# Patient Record
Sex: Female | Born: 1990 | Race: Black or African American | Hispanic: No | Marital: Single | Smoking: Former smoker
Health system: Southern US, Community
[De-identification: ages and names within clinical notes are randomized; demographics above are authoritative.]

## PROBLEM LIST (undated history)

## (undated) ENCOUNTER — Inpatient Hospital Stay (HOSPITAL_COMMUNITY): Payer: Self-pay

## (undated) DIAGNOSIS — A749 Chlamydial infection, unspecified: Secondary | ICD-10-CM

## (undated) DIAGNOSIS — A6920 Lyme disease, unspecified: Secondary | ICD-10-CM

## (undated) DIAGNOSIS — Z789 Other specified health status: Secondary | ICD-10-CM

## (undated) DIAGNOSIS — Z202 Contact with and (suspected) exposure to infections with a predominantly sexual mode of transmission: Secondary | ICD-10-CM

## (undated) HISTORY — PX: NO PAST SURGERIES: SHX2092

---
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
## 2012-05-14 ENCOUNTER — Encounter (HOSPITAL_COMMUNITY): Payer: Self-pay | Admitting: *Deleted

## 2012-05-14 ENCOUNTER — Inpatient Hospital Stay (HOSPITAL_COMMUNITY)
Admission: AD | Admit: 2012-05-14 | Discharge: 2012-05-14 | Disposition: A | Discharge status: Hospice | Payer: Medicaid Other | Source: Ambulatory Visit | Attending: Family Medicine | Admitting: Family Medicine

## 2012-05-14 DIAGNOSIS — O209 Hemorrhage in early pregnancy, unspecified: Secondary | ICD-10-CM | POA: Insufficient documentation

## 2012-05-14 HISTORY — DX: Contact with and (suspected) exposure to infections with a predominantly sexual mode of transmission: Z20.2

## 2012-05-14 LAB — URINALYSIS, ROUTINE W REFLEX MICROSCOPIC
Bilirubin Urine: NEGATIVE
Hgb urine dipstick: NEGATIVE
Ketones, ur: NEGATIVE mg/dL
Protein, ur: NEGATIVE mg/dL
Urobilinogen, UA: 1 mg/dL (ref 0.0–1.0)

## 2012-05-14 LAB — WET PREP, GENITAL
Clue Cells Wet Prep HPF POC: NONE SEEN
Trich, Wet Prep: NONE SEEN
Yeast Wet Prep HPF POC: NONE SEEN

## 2012-05-14 MED ORDER — PRENATAL VITAMINS (DIS) PO TABS: 1.0000 | ORAL_TABLET | Freq: Every day | ORAL | Status: DC

## 2012-05-14 NOTE — MAU Note (Signed)
Last period was June 14-18.  Has been nauseated since last week.

## 2012-05-14 NOTE — MAU Note (Signed)
PT SAYS   NO BIRTH CONTROL-  LAST SEX 7-12-    NO HOME PREG TEST.   CRAMPS STARTED ON  7-20- LAST X1 WEEK- NO CYCLE.  Marland Kitchen  NAUSEATED  X1 WEEK.

## 2012-05-14 NOTE — MAU Provider Note (Signed)
Chief Complaint:  Vaginal Bleeding and Abdominal Pain    First Provider Initiated Contact with Patient 05/14/12 2158      Lauren Mcclain is  21 y.o. G1P0.  Patient's last menstrual period was 03/30/2012.Marland Kitchen  Her pregnancy status is positive.  She presents complaining of Vaginal Bleeding and Abdominal Pain . Onset is described as ongoing and has been present for  1 week. Reports intermittent lower abd cramping and pink vaginal discharge.   Obstetrical/Gynecological History: OB History    Grav Para Term Preterm Abortions TAB SAB Ect Mult Living   2               Past Medical History: Past Medical History  Diagnosis Date  . Trichomonas contact, treated     Past Surgical History: Past Surgical History  Procedure Date  . No past surgeries     Family History: History reviewed. No pertinent family history.  Social History: History  Substance Use Topics  . Smoking status: Current Everyday Smoker -- 2 years    Types: Cigarettes  . Smokeless tobacco: Not on file  . Alcohol Use: Yes     4 weeks ago    Allergies: No Known Allergies  No prescriptions prior to admission    Review of Systems - History obtained from the patient Respiratory ROS: no cough, shortness of breath, or wheezing Cardiovascular ROS: no chest pain or dyspnea on exertion Gastrointestinal ROS: positive for - abdominal pain negative for - diarrhea, gas/bloating or nausea/vomiting Genito-Urinary ROS: no dysuria, trouble voiding, or hematuria positive for - genital discharge  Physical Exam   Blood pressure 106/65, pulse 71, temperature 98.7 F (37.1 C), temperature source Oral, resp. rate 16, height 5' 7.5" (1.715 m), weight 131 lb 6 oz (59.591 kg), last menstrual period 03/30/2012.  General: General appearance - alert, well appearing, and in no distress, oriented to person, place, and time and normal appearing weight Mental status - alert, oriented to person, place, and time, normal mood, behavior,  speech, dress, motor activity, and thought processes, affect appropriate to mood Abdomen - soft, nontender, nondistended, no masses or organomegaly Focused Gynecological Exam: VULVA: normal appearing vulva with no masses, tenderness or lesions, VAGINA: vaginal discharge - scant and creamy pink, normal vagina, CERVIX: normal appearing cervix without discharge or lesions, nulliparous os, UTERUS: enlarged to 5-6 week's size, ADNEXA: normal adnexa in size, nontender and no masses  Labs: Recent Results (from the past 24 hour(s))  URINALYSIS, ROUTINE W REFLEX MICROSCOPIC   Collection Time   05/14/12  8:16 PM      Component Value Range   Color, Urine YELLOW  YELLOW   APPearance CLEAR  CLEAR   Specific Gravity, Urine 1.015  1.005 - 1.030   pH 8.0  5.0 - 8.0   Glucose, UA NEGATIVE  NEGATIVE mg/dL   Hgb urine dipstick NEGATIVE  NEGATIVE   Bilirubin Urine NEGATIVE  NEGATIVE   Ketones, ur NEGATIVE  NEGATIVE mg/dL   Protein, ur NEGATIVE  NEGATIVE mg/dL   Urobilinogen, UA 1.0  0.0 - 1.0 mg/dL   Nitrite NEGATIVE  NEGATIVE   Leukocytes, UA NEGATIVE  NEGATIVE  WET PREP, GENITAL   Collection Time   05/14/12 10:18 PM      Component Value Range   Yeast Wet Prep HPF POC NONE SEEN  NONE SEEN   Trich, Wet Prep NONE SEEN  NONE SEEN   Clue Cells Wet Prep HPF POC NONE SEEN  NONE SEEN   WBC, Wet Prep HPF POC  FEW (*) NONE SEEN   Imaging Studies:  Informal bedside US shows IUP with + Cardiac activity. CRL [redacted]w[redacted]d, c/w LMP   Assessment: 1. Bleeding in early pregnancy     Plan: Discharge home Preg verification letter and OB referral list given FU with provider of choice to begin [facility].  Brick Ketcher E. 05/14/2012,11:02 PM

## 2012-05-15 LAB — GC/CHLAMYDIA PROBE AMP, GENITAL: Chlamydia, DNA Probe: NEGATIVE

## 2012-05-15 NOTE — MAU Provider Note (Signed)
Chart reviewed and agree with management and plan.  

## 2012-06-19 LAB — OB RESULTS CONSOLE RUBELLA ANTIBODY, IGM: Rubella: IMMUNE

## 2012-06-19 LAB — OB RESULTS CONSOLE HIV ANTIBODY (ROUTINE TESTING): HIV: NONREACTIVE

## 2012-10-17 NOTE — L&D Delivery Note (Signed)
During the second stage. The patient developed an enlarging hematoma in the right labia. This was obstructing the vaginal delivery. We set her up in the dorsal lithotomy position. We anesthetized the skin. We incised and drained a large hematoma from the right labia. Continue to hold pressure as she continued to push. She became exhausted we decide to proceed with a vacuum extracted assisted vaginal delivery. The risk of vacuum extractor assisted vaginal delivery were explained. This includes the risk of developing a hematoma of the scalp. This could require transfusions. There is a risk of intracranial bleed particularly subdural hematomas. The risk of shoulder dystocia with this resulting complications. He initially tried the [employer] vac. This did not maintain suction. We switched over to the [facility]. With this the vertex was easily delivered. The infant was a viable female with Apgars of 7/9. There was a second-degree laceration. We could see the area that was bleeding from the perineum that probably caused the hematoma. We first identified the distal section of the vaginal laceration. We brought this together with a running interlocking suture of 0 chromic. The deep stitches into the perineum with 2-0 chromic reapproximating this. The skin over the peritoneum was then closed with a running subcuticular of 2-0 chromic. We then put one deep figure-of-eight in the perineal body of 2-0 chromic. No further bleeding or development of a hematoma was noted. The area that we had incised and drained the hematoma was closed with interrupted sutures of 2-0 chromic. A Foley was placed to straight drain. Of note the placenta had been delivered intact. He did have a three-vessel cord. Total blood loss was approximately 650 cc. Mother and baby were doing well in the postpartum period.

## 2012-12-06 LAB — OB RESULTS CONSOLE GBS: GBS: NEGATIVE

## 2012-12-16 ENCOUNTER — Encounter (HOSPITAL_COMMUNITY): Payer: Self-pay | Admitting: Obstetrics and Gynecology

## 2012-12-16 ENCOUNTER — Inpatient Hospital Stay (HOSPITAL_COMMUNITY)
Admission: AD | Admit: 2012-12-16 | Discharge: 2012-12-16 | Disposition: A | Discharge status: Home / Self Care | Payer: Medicaid Other | Source: Ambulatory Visit | Attending: Obstetrics and Gynecology | Admitting: Obstetrics and Gynecology

## 2012-12-16 DIAGNOSIS — K5289 Other specified noninfective gastroenteritis and colitis: Secondary | ICD-10-CM | POA: Insufficient documentation

## 2012-12-16 DIAGNOSIS — R197 Diarrhea, unspecified: Secondary | ICD-10-CM | POA: Insufficient documentation

## 2012-12-16 DIAGNOSIS — O99891 Other specified diseases and conditions complicating pregnancy: Secondary | ICD-10-CM | POA: Insufficient documentation

## 2012-12-16 DIAGNOSIS — R109 Unspecified abdominal pain: Secondary | ICD-10-CM | POA: Insufficient documentation

## 2012-12-16 DIAGNOSIS — O212 Late vomiting of pregnancy: Secondary | ICD-10-CM | POA: Insufficient documentation

## 2012-12-16 DIAGNOSIS — K529 Noninfective gastroenteritis and colitis, unspecified: Secondary | ICD-10-CM

## 2012-12-16 HISTORY — DX: Other specified health status: Z78.9

## 2012-12-16 LAB — URINALYSIS, ROUTINE W REFLEX MICROSCOPIC
Glucose, UA: NEGATIVE mg/dL
Nitrite: NEGATIVE
Protein, ur: NEGATIVE mg/dL

## 2012-12-16 LAB — URINE MICROSCOPIC-ADD ON

## 2012-12-16 MED ORDER — LOPERAMIDE HCL 2 MG PO CAPS: 2.0000 mg | ORAL_CAPSULE | Freq: Four times a day (QID) | ORAL | Status: DC | PRN

## 2012-12-16 MED ORDER — ONDANSETRON 8 MG PO TBDP
8.0000 mg | ORAL_TABLET | Freq: Once | ORAL | Status: AC
  Administered 2012-12-16: 8 mg via ORAL
  Filled 2012-12-16 (×2): qty 1

## 2012-12-16 MED ORDER — ONDANSETRON 8 MG PO TBDP: 8.0000 mg | ORAL_TABLET | Freq: Three times a day (TID) | ORAL | Status: DC | PRN

## 2012-12-16 NOTE — MAU Note (Signed)
"  I've had diarrhea all day starting this morning.  My stomach is cramping from UC's and then after the UC goes away, my abd will still hurt for about 2 mins.  (+) FM, No VB or LOF."

## 2012-12-16 NOTE — MAU Note (Signed)
Lauren Mcclain is here today with complaints of Nausea and diarrhea. She has had 7 episodes of diarrhea. The symptoms started this morning when she woke up. She is [redacted]w[redacted]d

## 2012-12-16 NOTE — MAU Provider Note (Signed)
History     CSN: [id number]  Arrival date and time: 12/16/12 1557   None     Chief Complaint  Patient presents with  . Nausea  . Diarrhea   HPI 22 y.o. G1P0 at [redacted]w[redacted]d with nausea and diarrhea today, no vomiting, but unable to eat d/t nausea. No fever or chills. + low abd pain.    Past Medical History  Diagnosis Date  . Trichomonas contact, treated   . Medical history non-contributory     Past Surgical History  Procedure Laterality Date  . No past surgeries      Family History  Problem Relation Age of Onset  . Family history unknown: Yes    History  Substance Use Topics  . Smoking status: Current Every Day Smoker -- 2 years    Types: Cigarettes  . Smokeless tobacco: Not on file  . Alcohol Use: No     Comment: Not currently    Allergies: No Known Allergies  Prescriptions prior to admission  Medication Sig Dispense Refill  . Prenatal Vitamins (DIS) TABS Take 1 tablet by mouth daily.  90 tablet  5    ROS Physical Exam   Blood pressure 139/66, pulse 106, temperature 98.1 F (36.7 C), temperature source Oral, resp. rate 18, height 5\' 9"  (1.753 m), last menstrual period 03/30/2012.  Physical Exam  Nursing note and vitals reviewed. Constitutional: She is oriented to person, place, and time. She appears well-developed and well-nourished. No distress.  Cardiovascular: Normal rate.   Respiratory: Effort normal.  GI: Soft. She exhibits no mass. There is no tenderness. There is no rebound and no guarding.  Genitourinary:  Dilation: 1 Effacement (%): 50 Station: -1 Presentation: Vertex Exam by:: Georges Mouse, CNM   Musculoskeletal: Normal range of motion.  Neurological: She is alert and oriented to person, place, and time.  Skin: Skin is warm and dry.  Psychiatric: She has a normal mood and affect.    MAU Course  Procedures Results for orders placed during the hospital encounter of 12/16/12 (from the past 24 hour(s))  URINALYSIS, ROUTINE W REFLEX  MICROSCOPIC     Status: Abnormal   Collection Time    12/16/12  4:10 PM      Result Value Range   Color, Urine YELLOW  YELLOW   APPearance CLEAR  CLEAR   Specific Gravity, Urine >1.030 (*) 1.005 - 1.030   pH 6.0  5.0 - 8.0   Glucose, UA NEGATIVE  NEGATIVE mg/dL   Hgb urine dipstick NEGATIVE  NEGATIVE   Bilirubin Urine NEGATIVE  NEGATIVE   Ketones, ur NEGATIVE  NEGATIVE mg/dL   Protein, ur NEGATIVE  NEGATIVE mg/dL   Urobilinogen, UA 0.2  0.0 - 1.0 mg/dL   Nitrite NEGATIVE  NEGATIVE   Leukocytes, UA TRACE (*) NEGATIVE  URINE MICROSCOPIC-ADD ON     Status: Abnormal   Collection Time    12/16/12  4:10 PM      Result Value Range   Squamous Epithelial / LPF MANY (*) RARE   WBC, UA 11-20  <3 WBC/hpf   Bacteria, UA FEW (*) RARE   Urine-Other MUCOUS PRESENT     Zofran ODT 8 mg given in MAU  Assessment and Plan  22 y.o. G1P0 at [redacted]w[redacted]d with  1. Acute gastroenteritis       Medication List    TAKE these medications       acetaminophen 325 MG tablet  Commonly known as:  TYLENOL  Take 650 mg by mouth every 6 (  six) hours as needed for pain.     loperamide 2 MG capsule  Commonly known as:  IMODIUM  Take 1 capsule (2 mg total) by mouth 4 (four) times daily as needed for diarrhea or loose stools.     ondansetron 8 MG disintegrating tablet  Commonly known as:  ZOFRAN ODT  Take 1 tablet (8 mg total) by mouth every 8 (eight) hours as needed for nausea.     prenatal multivitamin Tabs  Take 1 tablet by mouth daily at 12 noon.            Follow-up Information   Follow up with Meriel Pica, MD. (as scheduled or sooner as needed)    Contact information:   [address] [place] [address] [phone]         [facility] 12/16/2012, 4:40 PM

## 2012-12-18 LAB — URINE CULTURE

## 2013-01-03 ENCOUNTER — Inpatient Hospital Stay (HOSPITAL_COMMUNITY)
Admission: AD | Admit: 2013-01-03 | Discharge: 2013-01-06 | DRG: 775 | Disposition: A | Discharge status: Home / Self Care | Payer: Medicaid Other | Source: Ambulatory Visit | Attending: Obstetrics and Gynecology | Admitting: Obstetrics and Gynecology

## 2013-01-03 ENCOUNTER — Telehealth (HOSPITAL_COMMUNITY): Payer: Self-pay | Admitting: *Deleted

## 2013-01-03 ENCOUNTER — Encounter (HOSPITAL_COMMUNITY): Payer: Self-pay | Admitting: Anesthesiology

## 2013-01-03 ENCOUNTER — Inpatient Hospital Stay (HOSPITAL_COMMUNITY)
Admission: AD | Admit: 2013-01-03 | Discharge: 2013-01-03 | Disposition: A | Discharge status: Home / Self Care | Payer: Medicaid Other | Source: Ambulatory Visit | Attending: Obstetrics and Gynecology | Admitting: Obstetrics and Gynecology

## 2013-01-03 ENCOUNTER — Encounter (HOSPITAL_COMMUNITY): Payer: Self-pay | Admitting: *Deleted

## 2013-01-03 ENCOUNTER — Inpatient Hospital Stay (HOSPITAL_COMMUNITY): Payer: Medicaid Other | Admitting: Anesthesiology

## 2013-01-03 DIAGNOSIS — O26899 Other specified pregnancy related conditions, unspecified trimester: Principal | ICD-10-CM | POA: Diagnosis not present

## 2013-01-03 DIAGNOSIS — O479 False labor, unspecified: Secondary | ICD-10-CM | POA: Insufficient documentation

## 2013-01-03 LAB — CBC
Hemoglobin: 10.9 g/dL — ABNORMAL LOW (ref 12.0–15.0)
MCH: 28.7 pg (ref 26.0–34.0)
MCHC: 33 g/dL (ref 30.0–36.0)
MCV: 86.8 fL (ref 78.0–100.0)
RBC: 3.8 MIL/uL — ABNORMAL LOW (ref 3.87–5.11)

## 2013-01-03 MED ORDER — LACTATED RINGERS IV SOLN
INTRAVENOUS | Status: DC
  Administered 2013-01-03 (×2): via INTRAVENOUS

## 2013-01-03 MED ORDER — OXYCODONE-ACETAMINOPHEN 5-325 MG PO TABS: 1.0000 | ORAL_TABLET | ORAL | Status: DC | PRN

## 2013-01-03 MED ORDER — FLEET ENEMA 7-19 GM/118ML RE ENEM: 1.0000 | ENEMA | RECTAL | Status: DC | PRN

## 2013-01-03 MED ORDER — FENTANYL 2.5 MCG/ML BUPIVACAINE 1/10 % EPIDURAL INFUSION (WH - ANES)
INTRAMUSCULAR | Status: AC
  Administered 2013-01-03: 14 mL/h via EPIDURAL
  Filled 2013-01-03: qty 125

## 2013-01-03 MED ORDER — PHENYLEPHRINE 40 MCG/ML (10ML) SYRINGE FOR IV PUSH (FOR BLOOD PRESSURE SUPPORT): 80.0000 ug | PREFILLED_SYRINGE | INTRAVENOUS | Status: DC | PRN

## 2013-01-03 MED ORDER — BUTORPHANOL TARTRATE 1 MG/ML IJ SOLN
1.0000 mg | Freq: Once | INTRAMUSCULAR | Status: AC
  Administered 2013-01-03: 1 mg via INTRAVENOUS
  Filled 2013-01-03: qty 1

## 2013-01-03 MED ORDER — ACETAMINOPHEN 325 MG PO TABS
650.0000 mg | ORAL_TABLET | ORAL | Status: DC | PRN
  Administered 2013-01-04: 650 mg via ORAL
  Filled 2013-01-03: qty 2

## 2013-01-03 MED ORDER — OXYCODONE-ACETAMINOPHEN 5-325 MG PO TABS
1.0000 | ORAL_TABLET | Freq: Once | ORAL | Status: AC
  Administered 2013-01-03: 1 via ORAL
  Filled 2013-01-03: qty 1

## 2013-01-03 MED ORDER — ONDANSETRON HCL 4 MG/2ML IJ SOLN: 4.0000 mg | Freq: Four times a day (QID) | INTRAMUSCULAR | Status: DC | PRN

## 2013-01-03 MED ORDER — OXYTOCIN 40 UNITS IN LACTATED RINGERS INFUSION - SIMPLE MED
62.5000 mL/h | INTRAVENOUS | Status: DC
  Administered 2013-01-04: 999 mL/h via INTRAVENOUS
  Filled 2013-01-03: qty 1000

## 2013-01-03 MED ORDER — LACTATED RINGERS IV SOLN: INTRAVENOUS | Status: DC

## 2013-01-03 MED ORDER — ACETAMINOPHEN 325 MG PO TABS: 650.0000 mg | ORAL_TABLET | ORAL | Status: DC | PRN

## 2013-01-03 MED ORDER — OXYTOCIN BOLUS FROM INFUSION: 500.0000 mL | INTRAVENOUS | Status: DC

## 2013-01-03 MED ORDER — SODIUM BICARBONATE 8.4 % IV SOLN
INTRAVENOUS | Status: DC | PRN
  Administered 2013-01-03: 5 mL via EPIDURAL

## 2013-01-03 MED ORDER — LACTATED RINGERS IV SOLN
500.0000 mL | Freq: Once | INTRAVENOUS | Status: AC
  Administered 2013-01-03: 18:00:00 via INTRAVENOUS

## 2013-01-03 MED ORDER — DIPHENHYDRAMINE HCL 50 MG/ML IJ SOLN
12.5000 mg | INTRAMUSCULAR | Status: DC | PRN
  Administered 2013-01-03 (×2): 12.5 mg via INTRAVENOUS
  Filled 2013-01-03 (×2): qty 1

## 2013-01-03 MED ORDER — EPHEDRINE 5 MG/ML INJ: 10.0000 mg | INTRAVENOUS | Status: DC | PRN

## 2013-01-03 MED ORDER — PHENYLEPHRINE 40 MCG/ML (10ML) SYRINGE FOR IV PUSH (FOR BLOOD PRESSURE SUPPORT)
80.0000 ug | PREFILLED_SYRINGE | INTRAVENOUS | Status: DC | PRN
  Filled 2013-01-03: qty 5

## 2013-01-03 MED ORDER — FENTANYL 2.5 MCG/ML BUPIVACAINE 1/10 % EPIDURAL INFUSION (WH - ANES)
14.0000 mL/h | INTRAMUSCULAR | Status: DC | PRN
  Administered 2013-01-03: 14 mL/h via EPIDURAL
  Filled 2013-01-03 (×2): qty 125

## 2013-01-03 MED ORDER — LACTATED RINGERS IV SOLN: 500.0000 mL | INTRAVENOUS | Status: DC | PRN

## 2013-01-03 MED ORDER — LIDOCAINE HCL (PF) 1 % IJ SOLN: 30.0000 mL | INTRAMUSCULAR | Status: DC | PRN

## 2013-01-03 MED ORDER — EPHEDRINE 5 MG/ML INJ
10.0000 mg | INTRAVENOUS | Status: DC | PRN
  Filled 2013-01-03: qty 4

## 2013-01-03 MED ORDER — IBUPROFEN 600 MG PO TABS: 600.0000 mg | ORAL_TABLET | Freq: Four times a day (QID) | ORAL | Status: DC | PRN

## 2013-01-03 MED ORDER — CITRIC ACID-SODIUM CITRATE 334-500 MG/5ML PO SOLN: 30.0000 mL | ORAL | Status: DC | PRN

## 2013-01-03 MED ORDER — IBUPROFEN 600 MG PO TABS
600.0000 mg | ORAL_TABLET | Freq: Four times a day (QID) | ORAL | Status: DC | PRN
  Administered 2013-01-04: 600 mg via ORAL
  Filled 2013-01-03: qty 1

## 2013-01-03 MED ORDER — LACTATED RINGERS IV SOLN: 500.0000 mL | Freq: Once | INTRAVENOUS | Status: DC

## 2013-01-03 NOTE — Anesthesia Preprocedure Evaluation (Signed)

## 2013-01-03 NOTE — MAU Note (Signed)
Dr. Renaldo Fiddler notified of pt, orders rec'd.

## 2013-01-03 NOTE — MAU Note (Signed)
contractions 

## 2013-01-03 NOTE — MAU Note (Signed)
Dr. Renaldo Fiddler notified of pt SVE, UC, pain and FHR tracing.  Monitor pt x 1 hr and recheck cervix.

## 2013-01-03 NOTE — MAU Note (Signed)
Pt up to bathroom.

## 2013-01-03 NOTE — Anesthesia Procedure Notes (Signed)

## 2013-01-03 NOTE — Progress Notes (Signed)
Pt denies srom

## 2013-01-03 NOTE — H&P (Signed)
Lauren Mcclain is a 22 y.o. female presenting at 40 weeks with SOL and SROM.  Negative GBS.  Uncomplicated PNC Maternal Medical History:  Reason for admission: Rupture of membranes and contractions.   Contractions: Onset was 3-5 hours ago.   Frequency: regular.   Perceived severity is strong.    Fetal activity: Perceived fetal activity is normal.    Prenatal complications: no prenatal complications Prenatal Complications - Diabetes: none.    OB History   Grav Para Term Preterm Abortions TAB SAB Ect Mult Living   1              Past Medical History  Diagnosis Date  . Trichomonas contact, treated   . Medical history non-contributory    Past Surgical History  Procedure Laterality Date  . No past surgeries     Family History: family history includes Drug abuse in her father and mother. Social History:  reports that she has quit smoking. Her smoking use included Cigarettes. She smoked 0.00 packs per day for 2 years. She does not have any smokeless tobacco history on file. She reports that she does not drink alcohol or use illicit drugs.   Prenatal Transfer Tool  Maternal Diabetes: No Genetic Screening: Normal Maternal Ultrasounds/Referrals: Normal Fetal Ultrasounds or other Referrals:  None Maternal Substance Abuse:  Yes:  Type: Marijuana Significant Maternal Medications:  None Significant Maternal Lab Results:  None Other Comments:  None  ROS    Last menstrual period 03/30/2012. Maternal Exam:  Uterine Assessment: Contraction strength is firm.  Contraction duration is 4 minutes. Contraction frequency is regular.   Abdomen: Patient reports no abdominal tenderness. Fundal height is c/w dates.   Fetal presentation: vertex  Introitus: Normal vulva. Nitrazine test: positive. Amniotic fluid character: clear.  Pelvis: adequate for delivery.   Cervix: 4 cm 80 %  vtx at -1 gross ROM  Physical Exam  Prenatal labs: ABO, Rh: B/Positive/-- (09/03 0000) Antibody: Negative  (09/03 0000) Rubella: Immune (09/03 0000) RPR: Nonreactive (09/03 0000)  HBsAg: Negative (09/03 0000)  HIV: Non-reactive (09/03 0000)  GBS: Negative (02/20 0000)   Assessment/Plan: IUP at term with spontaneous onset of labor Routine labor and delivery   Lauren Mcclain 01/03/2013, 3:40 PM

## 2013-01-03 NOTE — Progress Notes (Signed)
Pt now reports srom before md appointment this afternoon.  Dr Arelia Sneddon reports srom to rn

## 2013-01-03 NOTE — Telephone Encounter (Signed)
Preadmission screen  

## 2013-01-03 NOTE — Progress Notes (Signed)
efm removed per anesthesia    

## 2013-01-04 ENCOUNTER — Encounter (HOSPITAL_COMMUNITY): Payer: Self-pay | Admitting: *Deleted

## 2013-01-04 MED ORDER — ONDANSETRON HCL 4 MG/2ML IJ SOLN: 4.0000 mg | INTRAMUSCULAR | Status: DC | PRN

## 2013-01-04 MED ORDER — IBUPROFEN 600 MG PO TABS
600.0000 mg | ORAL_TABLET | Freq: Four times a day (QID) | ORAL | Status: DC
  Administered 2013-01-04 – 2013-01-06 (×9): 600 mg via ORAL
  Filled 2013-01-04 (×9): qty 1

## 2013-01-04 MED ORDER — LIDOCAINE HCL (PF) 1 % IJ SOLN
INTRAMUSCULAR | Status: AC
  Administered 2013-01-04: 30 mL
  Filled 2013-01-04: qty 30

## 2013-01-04 MED ORDER — FLEET ENEMA 7-19 GM/118ML RE ENEM: 1.0000 | ENEMA | Freq: Every day | RECTAL | Status: DC | PRN

## 2013-01-04 MED ORDER — PRENATAL MULTIVITAMIN CH
1.0000 | ORAL_TABLET | Freq: Every day | ORAL | Status: DC
  Administered 2013-01-05: 1 via ORAL
  Filled 2013-01-04: qty 1

## 2013-01-04 MED ORDER — BISACODYL 10 MG RE SUPP
10.0000 mg | Freq: Every day | RECTAL | Status: DC | PRN
  Filled 2013-01-04: qty 1

## 2013-01-04 MED ORDER — SENNOSIDES-DOCUSATE SODIUM 8.6-50 MG PO TABS
2.0000 | ORAL_TABLET | Freq: Every day | ORAL | Status: DC
  Administered 2013-01-04 – 2013-01-05 (×2): 2 via ORAL

## 2013-01-04 MED ORDER — BENZOCAINE-MENTHOL 20-0.5 % EX AERO
1.0000 "application " | INHALATION_SPRAY | CUTANEOUS | Status: DC | PRN
  Administered 2013-01-04: 1 via TOPICAL
  Filled 2013-01-04: qty 56

## 2013-01-04 MED ORDER — SIMETHICONE 80 MG PO CHEW
80.0000 mg | CHEWABLE_TABLET | ORAL | Status: DC | PRN
  Administered 2013-01-05: 80 mg via ORAL

## 2013-01-04 MED ORDER — LANOLIN HYDROUS EX OINT: TOPICAL_OINTMENT | CUTANEOUS | Status: DC | PRN

## 2013-01-04 MED ORDER — TETANUS-DIPHTH-ACELL PERTUSSIS 5-2.5-18.5 LF-MCG/0.5 IM SUSP
0.5000 mL | Freq: Once | INTRAMUSCULAR | Status: AC
  Administered 2013-01-05: 0.5 mL via INTRAMUSCULAR
  Filled 2013-01-04: qty 0.5

## 2013-01-04 MED ORDER — ONDANSETRON HCL 4 MG PO TABS: 4.0000 mg | ORAL_TABLET | ORAL | Status: DC | PRN

## 2013-01-04 MED ORDER — DIBUCAINE 1 % RE OINT: 1.0000 "application " | TOPICAL_OINTMENT | RECTAL | Status: DC | PRN

## 2013-01-04 MED ORDER — WITCH HAZEL-GLYCERIN EX PADS: 1.0000 "application " | MEDICATED_PAD | CUTANEOUS | Status: DC | PRN

## 2013-01-04 MED ORDER — DIPHENHYDRAMINE HCL 25 MG PO CAPS
25.0000 mg | ORAL_CAPSULE | Freq: Four times a day (QID) | ORAL | Status: DC | PRN
  Administered 2013-01-05: 25 mg via ORAL
  Filled 2013-01-04: qty 1

## 2013-01-04 MED ORDER — OXYCODONE-ACETAMINOPHEN 5-325 MG PO TABS
1.0000 | ORAL_TABLET | ORAL | Status: DC | PRN
  Administered 2013-01-04 – 2013-01-06 (×7): 1 via ORAL
  Filled 2013-01-04 (×7): qty 1

## 2013-01-04 MED ORDER — ZOLPIDEM TARTRATE 5 MG PO TABS: 5.0000 mg | ORAL_TABLET | Freq: Every evening | ORAL | Status: DC | PRN

## 2013-01-04 NOTE — Significant Event (Signed)
Initial efm  tracing from 1530-1641 on 01/03/2013 stored in error to [facility] #[id number]

## 2013-01-04 NOTE — Progress Notes (Signed)
Dr Arelia Sneddon discussing with pt risks and benefits of vacuum assisted delivery, pt verbalizes and understands, to proceed with vacuum delivery, see dr note.

## 2013-01-04 NOTE — Anesthesia Postprocedure Evaluation (Signed)
Anesthesia Post Note  Patient: Lauren Mcclain  Procedure(s) Performed: * No procedures listed *  Anesthesia type: Epidural  Patient location: Mother/Baby  Post pain: Pain level controlled  Post assessment: Post-op Vital signs reviewed  Last Vitals:  Filed Vitals:   01/04/13 0912  BP: 104/60  Pulse: 80  Temp: 37.1 C  Resp: 20    Post vital signs: Reviewed  Level of consciousness:alert  Complications: No apparent anesthesia complications

## 2013-01-05 LAB — CBC
HCT: 23.9 % — ABNORMAL LOW (ref 36.0–46.0)
Hemoglobin: 8 g/dL — ABNORMAL LOW (ref 12.0–15.0)
MCV: 86.3 fL (ref 78.0–100.0)
RDW: 14 % (ref 11.5–15.5)
WBC: 13.3 10*3/uL — ABNORMAL HIGH (ref 4.0–10.5)

## 2013-01-05 MED ORDER — FERROUS FUMARATE 325 (106 FE) MG PO TABS
1.0000 | ORAL_TABLET | Freq: Two times a day (BID) | ORAL | Status: DC
  Administered 2013-01-05: 106 mg via ORAL
  Filled 2013-01-05 (×3): qty 1

## 2013-01-05 MED ORDER — FAMOTIDINE 20 MG PO TABS
20.0000 mg | ORAL_TABLET | Freq: Two times a day (BID) | ORAL | Status: DC
  Administered 2013-01-05: 20 mg via ORAL
  Filled 2013-01-05: qty 1

## 2013-01-05 MED ORDER — ALUM & MAG HYDROXIDE-SIMETH 200-200-20 MG/5ML PO SUSP
30.0000 mL | ORAL | Status: DC | PRN
  Administered 2013-01-05: 30 mL via ORAL
  Filled 2013-01-05: qty 30

## 2013-01-05 MED ORDER — DOCUSATE SODIUM 100 MG PO CAPS
100.0000 mg | ORAL_CAPSULE | Freq: Two times a day (BID) | ORAL | Status: DC
  Administered 2013-01-05: 100 mg via ORAL
  Filled 2013-01-05: qty 1

## 2013-01-05 NOTE — Progress Notes (Signed)
PPD #1 Ambulating well Good pain relief Foley still in  Blood pressure 113/70, pulse 80, temperature 97.6 F (36.4 C), temperature source Oral, resp. rate 20, height 5\' 9"  (1.753 m), weight 180 lb (81.647 kg), last menstrual period 03/30/2012, SpO2 100.00%, unknown if currently breastfeeding.  Results for orders placed during the hospital encounter of 01/03/13 (from the past 24 hour(s))  CBC     Status: Abnormal   Collection Time    01/05/13  6:46 AM      Result Value Range   WBC 13.3 (*) 4.0 - 10.5 K/uL   RBC 2.77 (*) 3.87 - 5.11 MIL/uL   Hemoglobin 8.0 (*) 12.0 - 15.0 g/dL   HCT 16.1 (*) 09.6 - 04.5 %   MCV 86.3  78.0 - 100.0 fL   MCH 28.9  26.0 - 34.0 pg   MCHC 33.5  30.0 - 36.0 g/dL   RDW 40.9  81.1 - 91.4 %   Platelets 127 (*) 150 - 400 K/uL   FFNT Labia swollen R>>L, examined with nurse who saw patient yesterday-she states swelling is greatly improved LE NT without cords  A: Labial Hematoma in second stage of labor-S/P I&D during labor-improving  P: D/C foley      FESO4

## 2013-01-06 MED ORDER — BENZOCAINE-MENTHOL 20-0.5 % EX AERO: 1.0000 "application " | INHALATION_SPRAY | CUTANEOUS | Status: DC | PRN

## 2013-01-06 MED ORDER — PRENATAL MULTIVITAMIN CH: 1.0000 | ORAL_TABLET | Freq: Every day | ORAL | Status: DC

## 2013-01-06 MED ORDER — PNEUMOCOCCAL VAC POLYVALENT 25 MCG/0.5ML IJ INJ: 0.5000 mL | INJECTION | INTRAMUSCULAR | Status: DC

## 2013-01-06 MED ORDER — FERROUS FUMARATE 325 (106 FE) MG PO TABS: 1.0000 | ORAL_TABLET | Freq: Two times a day (BID) | ORAL | Status: DC

## 2013-01-06 MED ORDER — OXYCODONE-ACETAMINOPHEN 5-325 MG PO TABS: 1.0000 | ORAL_TABLET | Freq: Four times a day (QID) | ORAL | Status: DC | PRN

## 2013-01-06 NOTE — Discharge Summary (Signed)
Obstetric Discharge Summary Reason for Admission: onset of labor Prenatal Procedures: ultrasound Intrapartum Procedures: spontaneous vaginal delivery and I&D of labial hematoma during second stage Postpartum Procedures: none Complications-Operative and Postpartum: labial hematoma improving, voiding well Hemoglobin  Date Value Range Status  01/05/2013 8.0* 12.0 - 15.0 g/dL Final     DELTA CHECK NOTED     REPEATED TO VERIFY     HCT  Date Value Range Status  01/05/2013 23.9* 36.0 - 46.0 % Final    Physical Exam:  General: alert, cooperative and no distress Lochia: appropriate Uterine Fundus: firm Incision: healing well DVT Evaluation: No evidence of DVT seen on physical exam.  Discharge Diagnoses: Term Pregnancy-delivered  Discharge Information: Date: 01/06/2013 Activity: pelvic rest Diet: routine Medications: PNV, Ibuprofen and Percocet Condition: stable Instructions: refer to practice specific booklet Discharge to: home Follow-up Information   Call in 4 weeks to follow up.      Newborn Data: Live born female  Birth Weight: 8 lb 1.1 oz (3660 g) APGAR: 7, 9  Home with mother.  Lauren Mcclain,Lauren Mcclain 01/06/2013, 7:32 AM

## 2013-01-06 NOTE — Progress Notes (Signed)
Post Partum Day 2 Subjective: no complaints, voiding, tolerating PO and + flatus Ambulating well in hallways  Objective: Blood pressure 121/79, pulse 73, temperature 98 F (36.7 C), temperature source Oral, resp. rate 20, height 5\' 9"  (1.753 m), weight 180 lb (81.647 kg), last menstrual period 03/30/2012, SpO2 100.00%, unknown if currently breastfeeding.  Physical Exam:  General: alert, cooperative and no distress Lochia: appropriate Uterine Fundus: firm Incision: healiing well, swelling continues to improve DVT Evaluation: No evidence of DVT seen on physical exam.   Recent Labs  01/03/13 1540 01/05/13 0646  HGB 10.9* 8.0*  HCT 33.0* 23.9*    Assessment/Plan: Discharge home   LOS: 3 days   Marrion Finan II,Karissa Meenan E 01/06/2013, 7:27 AM

## 2013-01-08 ENCOUNTER — Inpatient Hospital Stay (HOSPITAL_COMMUNITY): Admission: RE | Admit: 2013-01-08 | Payer: Medicaid Other | Source: Ambulatory Visit

## 2013-03-02 ENCOUNTER — Emergency Department (HOSPITAL_COMMUNITY)
Admission: EM | Admit: 2013-03-02 | Discharge: 2013-03-02 | Disposition: A | Discharge status: Home / Self Care | Payer: Medicaid Other | Attending: Emergency Medicine | Admitting: Emergency Medicine

## 2013-03-02 ENCOUNTER — Encounter (HOSPITAL_COMMUNITY): Payer: Self-pay | Admitting: *Deleted

## 2013-03-02 DIAGNOSIS — T498X5A Adverse effect of other topical agents, initial encounter: Secondary | ICD-10-CM | POA: Insufficient documentation

## 2013-03-02 DIAGNOSIS — F17200 Nicotine dependence, unspecified, uncomplicated: Secondary | ICD-10-CM | POA: Insufficient documentation

## 2013-03-02 DIAGNOSIS — T783XXA Angioneurotic edema, initial encounter: Secondary | ICD-10-CM | POA: Insufficient documentation

## 2013-03-02 LAB — BASIC METABOLIC PANEL
BUN: 10 mg/dL (ref 6–23)
Calcium: 9.1 mg/dL (ref 8.4–10.5)
Creatinine, Ser: 0.82 mg/dL (ref 0.50–1.10)
GFR calc Af Amer: >90 mL/min (ref >90)
GFR calc non Af Amer: >90 mL/min (ref >90)
Glucose, Bld: 98 mg/dL (ref 70–99)

## 2013-03-02 LAB — CBC WITH DIFFERENTIAL/PLATELET
Basophils Relative: 0 % (ref 0–1)
Eosinophils Absolute: 0.1 10*3/uL (ref 0.0–0.7)
Eosinophils Relative: 1 % (ref 0–5)
HCT: 37 % (ref 36.0–46.0)
Hemoglobin: 12.2 g/dL (ref 12.0–15.0)
Lymphs Abs: 1.9 10*3/uL (ref 0.7–4.0)
MCH: 27.8 pg (ref 26.0–34.0)
MCHC: 33 g/dL (ref 30.0–36.0)
MCV: 84.3 fL (ref 78.0–100.0)
Monocytes Absolute: 0.4 10*3/uL (ref 0.1–1.0)
Monocytes Relative: 6 % (ref 3–12)
Neutrophils Relative %: 67 % (ref 43–77)

## 2013-03-02 MED ORDER — DIPHENHYDRAMINE HCL 50 MG/ML IJ SOLN
50.0000 mg | Freq: Once | INTRAMUSCULAR | Status: AC
Start: 2013-03-02 — End: 2013-03-02
  Administered 2013-03-02: 50 mg via INTRAVENOUS
  Filled 2013-03-02: qty 1

## 2013-03-02 MED ORDER — DEXAMETHASONE SODIUM PHOSPHATE 4 MG/ML IJ SOLN
4.0000 mg | Freq: Once | INTRAMUSCULAR | Status: AC
  Administered 2013-03-02: 4 mg via INTRAVENOUS
  Filled 2013-03-02: qty 1

## 2013-03-02 MED ORDER — FAMOTIDINE IN NACL 20-0.9 MG/50ML-% IV SOLN
20.0000 mg | Freq: Once | INTRAVENOUS | Status: AC
  Administered 2013-03-02: 20 mg via INTRAVENOUS
  Filled 2013-03-02: qty 50

## 2013-03-02 MED ORDER — SODIUM CHLORIDE 0.9 % IV BOLUS (SEPSIS)
1000.0000 mL | Freq: Once | INTRAVENOUS | Status: AC
  Administered 2013-03-02: 1000 mL via INTRAVENOUS

## 2013-03-02 NOTE — ED Notes (Signed)
Pt c/o swelling to lips that started last night. Edema noted to lips.

## 2013-03-02 NOTE — ED Provider Notes (Signed)
History     CSN: [id number]  Arrival date & time 03/02/13  4098   First MD Initiated Contact with Patient 03/02/13 1000      Chief Complaint  Patient presents with  . Facial Swelling    (Consider location/radiation/quality/duration/timing/severity/associated sxs/prior treatment) The history is provided by the patient.  Lauren Mcclain is a 22 y.o. female homeless healthy here presenting with lip swelling. Noticed left upper lip swollen yesterday. This morning at her entire lips were swollen. Denies any trouble breathing or stridor. She is not currently on any medications. No family history of angioedema. Denies being pregnant.   Past Medical History  Diagnosis Date  . Trichomonas contact, treated   . Medical history non-contributory     Past Surgical History  Procedure Laterality Date  . No past surgeries      Family History  Problem Relation Age of Onset  . Drug abuse Mother   . Drug abuse Father     History  Substance Use Topics  . Smoking status: Current Every Day Smoker -- 0.25 packs/day for 5 years    Types: Cigarettes    Last Attempt to Quit: 05/05/2012  . Smokeless tobacco: Not on file  . Alcohol Use: No     Comment: Not currently    OB History   Grav Para Term Preterm Abortions TAB SAB Ect Mult Living   1 1 1       1       Review of Systems  HENT:       Lip swelling   All other systems reviewed and are negative.    Allergies  Review of patient's allergies indicates no known allergies.  Home Medications   No current outpatient prescriptions on file.  BP 124/74  Temp(Src) 97.9 F (36.6 C) (Oral)  Resp 16  SpO2 100%  Physical Exam  Nursing note and vitals reviewed. Constitutional: She is oriented to person, place, and time. She appears well-developed and well-nourished.  HENT:  Head: Normocephalic.  Mouth/Throat: Oropharynx is clear and moist.  Both upper and lower lips are swollen. No evidence of insect bites or cellulitis. Lips are  tender bilaterally. No swelling in posterior OP and uvula is not swollen.   Eyes: Conjunctivae are normal. Pupils are equal, round, and reactive to light.  Neck: Normal range of motion. Neck supple.  No stridor   Cardiovascular: Normal rate, regular rhythm and normal heart sounds.   Pulmonary/Chest: Effort normal and breath sounds normal. No respiratory distress. She has no wheezes. She has no rales.  Abdominal: Soft. Bowel sounds are normal. She exhibits no distension. There is no tenderness. There is no rebound and no guarding.  Musculoskeletal: Normal range of motion. She exhibits no edema.  Neurological: She is alert and oriented to person, place, and time.  Skin: Skin is warm and dry.  Psychiatric: She has a normal mood and affect. Her behavior is normal. Judgment and thought content normal.    ED Course  Procedures (including critical care time)  Labs Reviewed  CBC WITH DIFFERENTIAL  BASIC METABOLIC PANEL   No results found.   No diagnosis found.    MDM  Lauren Mcclain is a 21 y.o. female here with angioedema. Not on meds to cause it. Likely either environmental exposure vs congenital. Will try with decadron, benadryl, pepcid and reassess.  12:35 PM Patient observed in the ED after meds for 2 hrs. Swelling not worsening. No drooling and patient is protecting her airway. Will d/c home  on benadryl and pepcid.        Richardean Canal, MD 03/02/13 1236

## 2013-03-11 ENCOUNTER — Emergency Department (HOSPITAL_COMMUNITY)
Admission: EM | Admit: 2013-03-11 | Discharge: 2013-03-11 | Disposition: A | Discharge status: Home / Self Care | Payer: Medicaid Other | Attending: Emergency Medicine | Admitting: Emergency Medicine

## 2013-03-11 ENCOUNTER — Encounter (HOSPITAL_COMMUNITY): Payer: Self-pay | Admitting: Adult Health

## 2013-03-11 DIAGNOSIS — I10 Essential (primary) hypertension: Secondary | ICD-10-CM | POA: Insufficient documentation

## 2013-03-11 DIAGNOSIS — T783XXA Angioneurotic edema, initial encounter: Secondary | ICD-10-CM | POA: Insufficient documentation

## 2013-03-11 DIAGNOSIS — R220 Localized swelling, mass and lump, head: Secondary | ICD-10-CM | POA: Insufficient documentation

## 2013-03-11 DIAGNOSIS — F17200 Nicotine dependence, unspecified, uncomplicated: Secondary | ICD-10-CM | POA: Insufficient documentation

## 2013-03-11 DIAGNOSIS — R221 Localized swelling, mass and lump, neck: Secondary | ICD-10-CM | POA: Insufficient documentation

## 2013-03-11 DIAGNOSIS — Z8619 Personal history of other infectious and parasitic diseases: Secondary | ICD-10-CM | POA: Insufficient documentation

## 2013-03-11 DIAGNOSIS — T783XXD Angioneurotic edema, subsequent encounter: Secondary | ICD-10-CM

## 2013-03-11 MED ORDER — DIPHENHYDRAMINE HCL 25 MG PO CAPS
25.0000 mg | ORAL_CAPSULE | Freq: Once | ORAL | Status: AC
  Administered 2013-03-11: 25 mg via ORAL
  Filled 2013-03-11: qty 1

## 2013-03-11 MED ORDER — FAMOTIDINE 20 MG PO TABS
20.0000 mg | ORAL_TABLET | Freq: Once | ORAL | Status: AC
  Administered 2013-03-11: 20 mg via ORAL
  Filled 2013-03-11: qty 1

## 2013-03-11 MED ORDER — DIPHENHYDRAMINE HCL 25 MG PO TABS: 25.0000 mg | ORAL_TABLET | Freq: Four times a day (QID) | ORAL | Status: DC

## 2013-03-11 MED ORDER — DEXAMETHASONE SODIUM PHOSPHATE 10 MG/ML IJ SOLN
10.0000 mg | Freq: Once | INTRAMUSCULAR | Status: AC
  Administered 2013-03-11: 10 mg via INTRAMUSCULAR
  Filled 2013-03-11: qty 1

## 2013-03-11 MED ORDER — FAMOTIDINE 20 MG PO TABS: 20.0000 mg | ORAL_TABLET | Freq: Two times a day (BID) | ORAL | Status: DC

## 2013-03-11 NOTE — ED Notes (Signed)
Pt presents with facial swelling to both eyes since last Saturday and rashes to neck and legs. No rash visible at this time. She was seen at [facility] long last Saturday, since then the swelling has been intermittient with the rashes. She reports feeling itchy. Denies SOB and no stridor. Airway intact. She reports being stung by a bee on her right hand this evening, denies allergy to bee stings.

## 2013-03-11 NOTE — ED Provider Notes (Signed)
History  This chart was scribed for non-physician practitioner working with Lauren Sprout, MD by Greggory Stallion, ED scribe. This patient was seen in room TR07C/TR07C and the patient's care was started at 10:05 PM.  CSN: [id number]  Arrival date & time 03/11/13  2157    Chief Complaint  Patient presents with  . Rash  . Facial Swelling    The history is provided by the patient. No language interpreter was used.    HPI Comments: Lauren Mcclain is a 22 y.o. female who presents to the Emergency Department complaining of intermittent facial swelling to both eyes that started yesterday and a rash to neck and legs that started last Saturday. Pt states she feels itchy. Pt states she was stung by a bee earlier this evening but denies allergy to bee stings. She states she was seen at [facility] last week and was given decadron and she said it helped. Pt denies fever, neck pain, sore throat, visual disturbance, CP, cough, SOB, abdominal pain, nausea, emesis, diarrhea, urinary symptoms, back pain, HA, weakness, and numbness as associated symptoms.   Sister is in the room and states she has had similar sx in the past.  Past Medical History  Diagnosis Date  . Trichomonas contact, treated   . Medical history non-contributory     Past Surgical History  Procedure Laterality Date  . No past surgeries      Family History  Problem Relation Age of Onset  . Drug abuse Mother   . Drug abuse Father     History  Substance Use Topics  . Smoking status: Current Every Day Smoker -- 0.25 packs/day for 5 years    Types: Cigarettes    Last Attempt to Quit: 05/05/2012  . Smokeless tobacco: Not on file  . Alcohol Use: No     Comment: Not currently    OB History   Grav Para Term Preterm Abortions TAB SAB Ect Mult Living   1 1 1       1       Review of Systems  A complete 10 system review of systems was obtained and all systems are negative except as noted in the HPI and PMH.   Allergies   Review of patient's allergies indicates no known allergies.  Home Medications  No current outpatient prescriptions on file.  BP 128/76  Pulse 80  Temp(Src) 98.6 F (37 C) (Oral)  Resp 16  SpO2 98%  Physical Exam  Nursing note and vitals reviewed. Constitutional: She is oriented to person, place, and time. She appears well-developed and well-nourished.  HENT:  Head: Normocephalic and atraumatic.  Mouth/Throat: Oropharynx is clear and moist.  No uvular edema. No tongue swelling.  Eyes:  Mild edema around both eyes.  Neck: Normal range of motion.  Cardiovascular: Normal rate and regular rhythm.   Pulmonary/Chest: Effort normal.  Musculoskeletal: Normal range of motion.  Right thenar eminence swelling, erythema, and lesion consistent with bee sting.   Neurological: She is alert and oriented to person, place, and time.  Skin: Skin is warm and dry.  No rash visible.    ED Course  Procedures (including critical care time)  DIAGNOSTIC STUDIES: Oxygen Saturation is 98% on RA, normal by my interpretation.    COORDINATION OF CARE: 10:25 PM-Discussed treatment plan with pt at bedside and pt agreed to plan.   Labs Reviewed - No data to display No results found.   1. Angioedema, subsequent encounter       MDM  Patient here complaining of facial swelling. She was seen approximately one week ago for angioedema and given a dose of Decadron in the emergency room. She denied any prescription has been taking any medications. She states her diffuse few days her symptoms were improved but not intermittent swelling has returned. She is not in any ACE inhibitor as her other medications. No allergic contacts. Patient's sister is present and states she had very similar things about a year ago and no one could ever find out why. Patient's symptoms are concerning for hereditary angioedema she has no tongue, lip or breathing involvement at this time. Discussed this with her and gave her  followup with allergy/ immunology.  Patient also was stung by bee today however he symptoms have been ongoing for at least a week before being stung. She's been stung by bees before it has no allergic reaction to them. Her symptoms today do not reflect an allergy to bees      I personally performed the services described in this documentation, which was scribed in my presence.  The recorded information has been reviewed and considered.   Lauren Sprout, MD 03/11/13 [phone]

## 2013-05-19 ENCOUNTER — Emergency Department (HOSPITAL_COMMUNITY)
Admission: EM | Admit: 2013-05-19 | Discharge: 2013-05-19 | Disposition: A | Discharge status: Home / Self Care | Payer: Self-pay | Attending: Emergency Medicine | Admitting: Emergency Medicine

## 2013-05-19 ENCOUNTER — Encounter (HOSPITAL_COMMUNITY): Payer: Self-pay | Admitting: *Deleted

## 2013-05-19 DIAGNOSIS — T783XXA Angioneurotic edema, initial encounter: Secondary | ICD-10-CM | POA: Insufficient documentation

## 2013-05-19 DIAGNOSIS — I10 Essential (primary) hypertension: Secondary | ICD-10-CM | POA: Insufficient documentation

## 2013-05-19 DIAGNOSIS — Z79899 Other long term (current) drug therapy: Secondary | ICD-10-CM | POA: Insufficient documentation

## 2013-05-19 DIAGNOSIS — F17200 Nicotine dependence, unspecified, uncomplicated: Secondary | ICD-10-CM | POA: Insufficient documentation

## 2013-05-19 MED ORDER — FAMOTIDINE IN NACL 20-0.9 MG/50ML-% IV SOLN
20.0000 mg | Freq: Once | INTRAVENOUS | Status: AC
  Administered 2013-05-19: 20 mg via INTRAVENOUS
  Filled 2013-05-19: qty 50

## 2013-05-19 MED ORDER — PREDNISONE 20 MG PO TABS: ORAL_TABLET | ORAL | Status: DC

## 2013-05-19 MED ORDER — DIPHENHYDRAMINE HCL 50 MG/ML IJ SOLN
25.0000 mg | Freq: Once | INTRAMUSCULAR | Status: AC
  Administered 2013-05-19: 25 mg via INTRAVENOUS
  Filled 2013-05-19: qty 1

## 2013-05-19 MED ORDER — METHYLPREDNISOLONE SODIUM SUCC 125 MG IJ SOLR
125.0000 mg | Freq: Once | INTRAMUSCULAR | Status: AC
  Administered 2013-05-19: 125 mg via INTRAVENOUS
  Filled 2013-05-19: qty 2

## 2013-05-19 NOTE — ED Provider Notes (Signed)
CSN: [id number]     Arrival date & time 05/19/13  2017 History     First MD Initiated Contact with Patient 05/19/13 2021     Chief Complaint  Patient presents with  . Facial Swelling   (Consider location/radiation/quality/duration/timing/severity/associated sxs/prior Treatment) HPI Comments: Patient comes to the ER for evaluation of lip swelling. Patient reports that she had onset of swelling earlier today. Patient reports significant swelling of the left side of her upper lip. There is no trouble swallowing and no difficulty breathing. Patient reports that she has had this happen several times before. She has not taken any medications. She denies any foods or exposures.   Past Medical History  Diagnosis Date  . Trichomonas contact, treated   . Medical history non-contributory    Past Surgical History  Procedure Laterality Date  . No past surgeries     Family History  Problem Relation Age of Onset  . Drug abuse Mother   . Drug abuse Father    History  Substance Use Topics  . Smoking status: Current Every Day Smoker -- 0.25 packs/day for 5 years    Types: Cigarettes    Last Attempt to Quit: 05/05/2012  . Smokeless tobacco: Not on file  . Alcohol Use: No     Comment: Not currently   OB History   Grav Para Term Preterm Abortions TAB SAB Ect Mult Living   1 1 1       1      Review of Systems  HENT: Negative for trouble swallowing.   Respiratory: Negative for shortness of breath.   Gastrointestinal: Negative.   Skin: Negative for rash.  All other systems reviewed and are negative.    Allergies  Review of patient's allergies indicates no known allergies.  Home Medications   Current Outpatient Rx  Name  Route  Sig  Dispense  Refill  . diphenhydrAMINE (BENADRYL) 25 MG tablet   Oral   Take 1 tablet (25 mg total) by mouth every 6 (six) hours.   20 tablet   0   . famotidine (PEPCID) 20 MG tablet   Oral   Take 1 tablet (20 mg total) by mouth 2 (two) times daily.  30 tablet   0    BP 121/59  Temp(Src) 98.1 F (36.7 C) (Oral)  Resp 18  SpO2 100% Physical Exam  Constitutional: She is oriented to person, place, and time. She appears well-developed and well-nourished. No distress.  HENT:  Head: Normocephalic and atraumatic.    Right Ear: Hearing normal.  Left Ear: Hearing normal.  Nose: Nose normal.  Mouth/Throat: Oropharynx is clear and moist and mucous membranes are normal. No edematous. No posterior oropharyngeal edema, posterior oropharyngeal erythema or tonsillar abscesses.  Eyes: Conjunctivae and EOM are normal. Pupils are equal, round, and reactive to light.  Neck: Normal range of motion. Neck supple.  Cardiovascular: Regular rhythm, S1 normal and S2 normal.  Exam reveals no gallop and no friction rub.   No murmur heard. Pulmonary/Chest: Effort normal and breath sounds normal. No respiratory distress. She exhibits no tenderness.  Abdominal: Soft. Normal appearance and bowel sounds are normal. There is no hepatosplenomegaly. There is no tenderness. There is no rebound, no guarding, no tenderness at McBurney's point and negative Murphy's sign. No hernia.  Musculoskeletal: Normal range of motion.  Neurological: She is alert and oriented to person, place, and time. She has normal strength. No cranial nerve deficit or sensory deficit. Coordination normal. GCS eye subscore is 4. GCS verbal  subscore is 5. GCS motor subscore is 6.  Skin: Skin is warm, dry and intact. No rash noted. No cyanosis.  Psychiatric: She has a normal mood and affect. Her speech is normal and behavior is normal. Thought content normal.    ED Course   Procedures (including critical care time)  Labs Reviewed - No data to display No results found.  Diagnosis: Angioedema, unclear etiology  MDM  Patient presented to the ER for evaluation of swelling of the left side of her upper lip. Patient reports that she has had similar episodes in the past. She has never found a  trigger for these episodes. The patient did not have any oropharyngeal or airway involvement. She was observed for a period of time and has not had any worsening. She did not have any response to Benadryl, Pepcid, Solu-Medrol, but that would not be expected with angioedema. Patient will be given a prednisone taper. She'll take Benadryl every 6 hours for the next 2 days, then as needed. Return if her symptoms worsen.  Gilda Crease, MD 05/19/13 2154

## 2013-05-19 NOTE — ED Notes (Signed)
Pt presents with edema in lips and face. Denies food allergies, Denies taking medications and difficulty breathing allergies.

## 2013-05-19 NOTE — ED Notes (Signed)
MD at bedside. 

## 2013-07-10 ENCOUNTER — Encounter (HOSPITAL_COMMUNITY): Payer: Self-pay

## 2013-07-10 ENCOUNTER — Inpatient Hospital Stay (HOSPITAL_COMMUNITY)
Admission: AD | Admit: 2013-07-10 | Discharge: 2013-07-10 | Disposition: A | Discharge status: Home / Self Care | Payer: Medicaid Other | Source: Ambulatory Visit | Attending: Obstetrics and Gynecology | Admitting: Obstetrics and Gynecology

## 2013-07-10 DIAGNOSIS — R109 Unspecified abdominal pain: Secondary | ICD-10-CM | POA: Insufficient documentation

## 2013-07-10 DIAGNOSIS — N911 Secondary amenorrhea: Secondary | ICD-10-CM

## 2013-07-10 DIAGNOSIS — A5901 Trichomonal vulvovaginitis: Secondary | ICD-10-CM | POA: Insufficient documentation

## 2013-07-10 DIAGNOSIS — N912 Amenorrhea, unspecified: Secondary | ICD-10-CM | POA: Insufficient documentation

## 2013-07-10 DIAGNOSIS — N949 Unspecified condition associated with female genital organs and menstrual cycle: Secondary | ICD-10-CM | POA: Insufficient documentation

## 2013-07-10 LAB — WET PREP, GENITAL: Yeast Wet Prep HPF POC: NONE SEEN

## 2013-07-10 LAB — URINALYSIS, ROUTINE W REFLEX MICROSCOPIC
Bilirubin Urine: NEGATIVE
Nitrite: NEGATIVE
Protein, ur: NEGATIVE mg/dL
Specific Gravity, Urine: 1.025 (ref 1.005–1.030)
Urobilinogen, UA: 0.2 mg/dL (ref 0.0–1.0)

## 2013-07-10 LAB — URINE MICROSCOPIC-ADD ON

## 2013-07-10 MED ORDER — METRONIDAZOLE 500 MG PO TABS
2000.0000 mg | ORAL_TABLET | Freq: Once | ORAL | Status: AC
  Administered 2013-07-10: 2000 mg via ORAL
  Filled 2013-07-10: qty 4

## 2013-07-10 MED ORDER — NORGESTIMATE-ETH ESTRADIOL 0.25-35 MG-MCG PO TABS: 1.0000 | ORAL_TABLET | Freq: Every day | ORAL | Status: DC

## 2013-07-10 MED ORDER — ONDANSETRON HCL 4 MG PO TABS
4.0000 mg | ORAL_TABLET | Freq: Once | ORAL | Status: DC
  Filled 2013-07-10: qty 1

## 2013-07-10 NOTE — MAU Provider Note (Signed)
Chief Complaint: No chief complaint on file.   First Provider Initiated Contact with Patient 07/10/13 2053     SUBJECTIVE HPI: Lauren CASTELO is a 22 y.o. G37P1001 female who presents with intermittent low abdominal cramping x 6 days. Slight increase in vaginal discharge. Denies fever, chills, vaginal bleeding or dyspareunia. LMP 06/08/13. Pt states she is in a mutually monogamous relationship.  Past Medical History  Diagnosis Date  . Trichomonas contact, treated   . Medical history non-contributory    OB History  Gravida Para Term Preterm AB SAB TAB Ectopic Multiple Living  1 1 1       1     # Outcome Date GA Lbr Len/2nd Weight Sex Delivery Anes PTL Lv  1 TRM 01/04/13 [redacted]w[redacted]d 452:31 / 01:11 3.66 kg (8 lb 1.1 oz) M VAC EPI  Y     Past Surgical History  Procedure Laterality Date  . No past surgeries     History   Social History  . Marital Status: Single    Spouse Name: N/A    Number of Children: N/A  . Years of Education: N/A   Occupational History  . Not on file.   Social History Main Topics  . Smoking status: Current Every Day Smoker -- 0.25 packs/day for 5 years    Types: Cigarettes    Last Attempt to Quit: 05/05/2012  . Smokeless tobacco: Not on file  . Alcohol Use: No     Comment: Not currently  . Drug Use: No     Comment: Not currently  . Sexual Activity: Yes    Birth Control/ Protection: None   Other Topics Concern  . Not on file   Social History Narrative  . No narrative on file   No current facility-administered medications on file prior to encounter.   Current Outpatient Prescriptions on File Prior to Encounter  Medication Sig Dispense Refill  . diphenhydrAMINE (BENADRYL) 25 MG tablet Take 1 tablet (25 mg total) by mouth every 6 (six) hours.  20 tablet  0   No Known Allergies  ROS: Pertinent items in HPI  OBJECTIVE Blood pressure 122/85, pulse 68, temperature 98.6 F (37 C), temperature source Oral, resp. rate 16, height 5\' 9"  (1.753 m), weight  63.685 kg (140 lb 6.4 oz), last menstrual period 06/08/2013, SpO2 100.00%, not currently breastfeeding. GENERAL: Well-developed, well-nourished female in no acute distress.  HEENT: Normocephalic HEART: normal rate RESP: normal effort ABDOMEN: Soft, non-tender EXTREMITIES: Nontender, no edema NEURO: Alert and oriented SPECULUM EXAM: NEFG, small amount of yellow, frothy, malodorous discharge. no blood noted, cervix clean. BIMANUAL: cervix closed; uterus normal size, no adnexal tenderness or masses. No cervical motion tenderness.  LAB RESULTS Results for orders placed during the hospital encounter of 07/10/13 (from the past 24 hour(s))  URINALYSIS, ROUTINE W REFLEX MICROSCOPIC     Status: Abnormal   Collection Time    07/10/13  7:32 PM      Result Value Range   Color, Urine STRAW (*) YELLOW   APPearance CLEAR  CLEAR   Specific Gravity, Urine 1.025  1.005 - 1.030   pH 7.0  5.0 - 8.0   Glucose, UA NEGATIVE  NEGATIVE mg/dL   Hgb urine dipstick TRACE (*) NEGATIVE   Bilirubin Urine NEGATIVE  NEGATIVE   Ketones, ur NEGATIVE  NEGATIVE mg/dL   Protein, ur NEGATIVE  NEGATIVE mg/dL   Urobilinogen, UA 0.2  0.0 - 1.0 mg/dL   Nitrite NEGATIVE  NEGATIVE   Leukocytes, UA MODERATE (*) NEGATIVE  URINE MICROSCOPIC-ADD ON     Status: Abnormal   Collection Time    07/10/13  7:32 PM      Result Value Range   Squamous Epithelial / LPF FEW (*) RARE   WBC, UA 7-10  <3 WBC/hpf   RBC / HPF 0-2  <3 RBC/hpf   Bacteria, UA RARE  RARE  POCT PREGNANCY, URINE     Status: None   Collection Time    07/10/13  7:50 PM      Result Value Range   Preg Test, Ur NEGATIVE  NEGATIVE  WET PREP, GENITAL     Status: Abnormal   Collection Time    07/10/13  9:05 PM      Result Value Range   Yeast Wet Prep HPF POC NONE SEEN  NONE SEEN   Trich, Wet Prep MODERATE (*) NONE SEEN   Clue Cells Wet Prep HPF POC FEW (*) NONE SEEN   WBC, Wet Prep HPF POC MODERATE (*) NONE SEEN    IMAGING No results found.  MAU  COURSE Trichomonas treated with Flagyl 2 g in MAU.  ASSESSMENT 1. Vaginal trichomoniasis   2. Secondary amenorrhea    PLAN Discharge home in stable condition. Menstrual period 2 weeks take home UPT. Offered referral to GYN clinic if secondary amenorrhea continues. Declined. [place] be [place] out of [place] in one month. Strongly recommend using condoms until pregnancy is ruled out if pregnancy is not desired. Partner needs treatment. No intercourse until at least one week after both you and her partner have been treated always use condoms.   Medication List         norgestimate-ethinyl estradiol 0.25-35 MG-MCG tablet  Commonly known as:  ORTHO-CYCLEN,SPRINTEC,PREVIFEM  Take 1 tablet by mouth daily. Start first Sunday after period starts.       Follow-up Information   Follow up with Gynecologist . (if no menstrual period in the next month)      [place], CNM 07/10/2013  8:48 PM

## 2013-07-10 NOTE — MAU Note (Signed)
Lower abdominal cramping since last Friday. Denies vaginal bleeding or vaginal discharge. Denies being on birth control. LMP 06/08/13. Has not taken UPT at home. Denies any other symptoms.

## 2013-07-11 LAB — GC/CHLAMYDIA PROBE AMP: CT Probe RNA: POSITIVE — AB

## 2013-07-12 ENCOUNTER — Encounter (HOSPITAL_COMMUNITY): Payer: Self-pay | Admitting: *Deleted

## 2013-07-14 NOTE — MAU Provider Note (Signed)
Attestation of Attending Supervision of Advanced Practitioner: Evaluation and management procedures were performed by the PA/NP/CNM/OB Fellow under my supervision/collaboration. Chart reviewed and agree with management and plan.  Lauren Mcclain V 07/14/2013 6:40 PM

## 2013-08-02 ENCOUNTER — Inpatient Hospital Stay (HOSPITAL_COMMUNITY)
Admission: AD | Admit: 2013-08-02 | Discharge: 2013-08-02 | Disposition: A | Discharge status: Home / Self Care | Payer: Self-pay | Source: Ambulatory Visit | Attending: Obstetrics and Gynecology | Admitting: Obstetrics and Gynecology

## 2013-08-02 DIAGNOSIS — N739 Female pelvic inflammatory disease, unspecified: Secondary | ICD-10-CM | POA: Insufficient documentation

## 2013-08-02 DIAGNOSIS — A5609 Other chlamydial infection of lower genitourinary tract: Secondary | ICD-10-CM

## 2013-08-02 DIAGNOSIS — A5619 Other chlamydial genitourinary infection: Secondary | ICD-10-CM | POA: Insufficient documentation

## 2013-08-02 MED ORDER — AZITHROMYCIN 250 MG PO TABS
1000.0000 mg | ORAL_TABLET | Freq: Once | ORAL | Status: AC
  Administered 2013-08-02: 1000 mg via ORAL
  Filled 2013-08-02: qty 4

## 2013-08-02 NOTE — MAU Note (Signed)
Patient denies any adverse reaction to the medication.

## 2013-08-02 NOTE — MAU Provider Note (Signed)
  HPI: Ms. Lauren Mcclain is a 22 y.o. female who presents for treatment for Chlamydia. She received a letter stating her culture was positive and she came here for treatment. She denies pain or any concerns at this time.  Objective:   GENERAL: Well-developed, well-nourished female in no acute distress.  HEENT: Normocephalic, atraumatic.   LUNGS: Effort normal HEART: Regular rate  SKIN: Warm, dry and without erythema PSYCH: Normal mood and affect  A: Positive Chlamydia culture  P: Discharge home Treatment in MAU given for Chlamydia; Azithromycin 1 gram po, patient tolerated it well Return to MAU if symptoms worsen.  You have been diagnosed with a sexually transmitted disease.  You have been treated and your partner(s) will need to be treated.  No sex until 10 days after you finished your medicine and no sex until 10 days after your partner has taken their medication.   [name], NP 08/02/2013 3:44 PM

## 2013-08-05 NOTE — MAU Provider Note (Signed)
Attestation of Attending Supervision of Advanced Practitioner (CNM/NP): Evaluation and management procedures were performed by the Advanced Practitioner under my supervision and collaboration.  I have reviewed the Advanced Practitioner's note and chart, and I agree with the management and plan.  Kymberly Blomberg 08/05/2013 2:41 PM   

## 2013-10-17 NOTE — L&D Delivery Note (Signed)
Delivery Note At 6:10 AM a viable female was delivered via  (Presentation: ;  ).  APGAR: , ; weight .   Placenta status: , Spontaneous.  Cord:  with the following complications: .  Cord pH: not done  Anesthesia:   Episiotomy:  Lacerations:  Suture Repair: 2.0 vicryl Est. Blood Loss (mL):   Mom to postpartum.  Baby to Couplet care / Skin to Skin.  Lauren Mcclain A 06/14/2014, 6:18 AM

## 2013-10-23 ENCOUNTER — Inpatient Hospital Stay: Admit: 2013-10-23 | Discharge: 2013-10-24 | Attending: Emergency Medicine

## 2013-10-23 LAB — URINALYSIS WITH REFLEX TO CULTURE
Bilirubin Urine: NEGATIVE
Glucose, Ur: NEGATIVE mg/dL
Ketones, Urine: NEGATIVE mg/dL
Nitrite, Urine: NEGATIVE
Protein, UA: NEGATIVE mg/dL
Specific Gravity, UA: >=1.03 (ref 1.005–1.030)
Urobilinogen, Urine: 0.2 E.U./dL (ref <2.0)
pH, UA: 6.5 (ref 5.0–8.0)

## 2013-10-23 LAB — PREGNANCY, URINE: HCG(Urine) Pregnancy Test: POSITIVE

## 2013-10-23 NOTE — Discharge Instructions (Signed)
Learning About Pregnancy  Your Care Instructions  Your health in the early weeks of your pregnancy is particularly important for your baby's health. Take good care of yourself. Anything you do that harms your body can also harm your baby.  Make sure to go to all of your doctor appointments. Regular checkups will help keep you and your baby healthy.  Follow-up care is a key part of your treatment and safety. Be sure to make and go to all appointments, and call your doctor if you are having problems. It's also a good idea to know your test results and keep a list of the medicines you take.  How can you care for yourself at home?  Diet   Eat a balanced diet. Make sure your diet includes plenty of beans, peas, and leafy green vegetables.   Do not skip meals or go for many hours without eating. If you are nauseated, try to eat a small, healthy snack every 2 to 3 hours.   Do not eat fish that has a high level of mercury, such as shark, swordfish, or mackerel. Do not eat more than one can of tuna each week.   Drink plenty of fluids, enough so that your urine is light yellow or clear like water. If you have kidney, heart, or liver disease and have to limit fluids, talk with your doctor before you increase the amount of fluids you drink.   Cut down on caffeine, such as coffee, tea, and cola.   Do not drink alcohol, such as beer, wine, or hard liquor.   Take a multivitamin that contains at least 400 micrograms (mcg) of folic acid to help prevent birth defects. Fortified cereal and whole wheat bread are good additional sources of folic acid.   Increase the calcium in your diet. Try to drink a quart of skim milk each day. You may also take calcium supplements and choose foods such as cheese and yogurt.  Lifestyle   Make sure you go to your follow-up appointments.   Get plenty of rest. You may be unusually tired while you are pregnant.   Get at least 30 minutes of exercise on most days of the week. Walking is a  good choice. If you have not exercised in the past, start out slowly. Take several short walks each day.   Do not smoke. If you need help quitting, talk to your doctor about stop-smoking programs. These can increase your chances of quitting for good.   Do not touch cat feces or litter boxes. Also, wash your hands after you handle raw meat, and fully cook all meat before you eat it. Wear gloves when you work in the yard or garden, and wash your hands well when you are done. Cat feces, raw or undercooked meat, and contaminated dirt can cause an infection that may harm your baby or lead to a miscarriage.   Do not use saunas or hot tubs. Raising your body temperature may harm your baby.   Avoid chemical fumes, paint fumes, or poisons.   Do not use illegal drugs or alcohol.  Medicines   Review all of your medicines with your doctor. Some of your routine medicines may need to be changed to protect your baby.   Use acetaminophen (Tylenol) to relieve minor problems, such as a mild headache or backache or a mild fever with cold symptoms. Do not use nonsteroidal anti-inflammatory drugs (NSAIDs), such as ibuprofen (Advil, Motrin) or naproxen (Aleve), unless your doctor says it is  okay.   Do not take two or more pain medicines at the same time unless the doctor told you to. Many pain medicines have acetaminophen, which is Tylenol. Too much acetaminophen (Tylenol) can be harmful.   Take your medicines exactly as prescribed. Call your doctor if you think you are having a problem with your medicine.  To manage morning sickness   If you feel sick when you first wake up, try eating a small snack (such as crackers) before you get out of bed. Allow some time to digest the snack, and then get out of bed slowly.   Do not skip meals or go for long periods without eating. An empty stomach can make nausea worse.   Eat small, frequent meals instead of three large meals each day.   Drink plenty of fluids. Sports drinks, such as  Gatorade or Powerade, are good choices.   Eat foods that are high in protein but low in fat.   If you are taking iron supplements, ask your doctor if they are necessary. Iron can make nausea worse.   Avoid any smells, such as coffee, that make you feel sick.   Get lots of rest. Morning sickness may be worse when you are tired.   Where can you learn more?   Go to [email or web address] and sign in to your MyChart account. Enter [phone] in the [place] box to learn more about "Learning About Pregnancy."    If you do not have an account, please click on the "Sign Up Now" link.      2006-2014 [employer]. Care instructions adapted under license by [facility]. This care instruction is for use with your licensed healthcare professional. If you have questions about a medical condition or this instruction, always ask your healthcare professional. [place] any warranty or liability for your use of this information.  Content Version: 10.3.368381; Current as of: March 20, 2013        Your pregnancy test is positive.  The blood test also supports the active pregnancy.  The ultrasound shows a 5 week 3 day intrauterine pregnancy.  Recommend that you contact the OB physician group ear at [facility] tomorrow and be seen on Friday.  You will need a repeat blood test on Friday.  Recommend no smoking, no alcohol and to take prenatal vitamins.

## 2013-10-23 NOTE — ED Provider Notes (Signed)
HPI     [facility] : [facility] ED  Emergency Department Encounter    Pt Name: Terri Cross  MRN: [id number]  Birthdate 1991/08/04  Date of evaluation: 10/23/2013  Provider: Rick Duff, PA    Chief Complaint   Patient presents with   ??? Vaginal Bleeding     patient here for vaginal bleeding and pregnant. started yesterday. c/o cramping        Nursing Notes, Past Medical Hx, Past Surgical Hx, Social Hx, Allergies, and Family Hx were all reviewed and agreed with or any disagreements were addressed in the HPI.    HPI:  (Location, Duration, Timing, Severity, Quality, Assoc Sx, Context, Modifying factors)  This is a  23 y.o. female presents for evaluation of vaginal bleeding and pregnancy.  The patient states her last normal menstrual cycle occurred early to mid-November.  She doesn't recall specifically.  She is G1 P1 Ab0.  The patient states that yesterday she had some mild cramping and had small amount of bleeding.  She states used one pad which did not soak through.  She has not had any bleeding since yesterday or about 24 hours.  She has had a panic later today with no noted throughout the day.  She has no cramping at this time.  She has mild lower abdominal discomfort more to the left.  Does not describe any nausea, vomiting or dysuria.  The patient recently moved from [place].  She does not have OB/GYN in this area.  The patient would like to find out if she is possibly miscarrying.  The nursing notes are reviewed and I agree.  She is not bleeding at this time.  She does not have vaginal discharge reported.  The patient did check a home pregnancy test a few days ago and it was positive.    Past Medical/Surgical History:  History reviewed. No pertinent past medical history.  History reviewed. No pertinent past surgical history.    Medications:  Previous Medications    No medications on file         Review of Systems   Constitutional: Negative for fever and chills.   Respiratory: Negative for cough and  shortness of breath.    Cardiovascular: Negative for chest pain.   Gastrointestinal: Positive for abdominal pain. Negative for nausea and diarrhea.   Genitourinary: Positive for menstrual problem. Negative for dysuria, urgency, flank pain, vaginal bleeding and vaginal discharge.        Positive for history of vaginal bleeding yesterday.   Musculoskeletal: Negative for back pain.   All other systems reviewed and are negative.        Physical Exam   Constitutional: She is oriented to person, place, and time. She appears well-developed and well-nourished.   HENT:   Head: Normocephalic and atraumatic.   Right Ear: External ear normal.   Left Ear: External ear normal.   Eyes: Conjunctivae are normal. Right eye exhibits no discharge. Left eye exhibits no discharge. No scleral icterus.   Neck: Normal range of motion.   Cardiovascular: Normal rate, regular rhythm and normal heart sounds.    Pulmonary/Chest: Effort normal and breath sounds normal.   Abdominal: Bowel sounds are normal. She exhibits no mass. There is tenderness. There is no rebound and no guarding.   The patient does have tenderness left lower quadrant with gentle palpation.  Very little tenderness in the right as well as midline.   Genitourinary:   A pelvic exam is performed with nurse chaperone in the  room.  Patient has no external lesions.  Speculum is introduced.  No pain.  No bleeding noted from the closed cervix.  No blood in vaginal vault.  No clot.  Small amount of discharge.  Specimens collected for wet prep as well as GC/Chlamydia.  Bimanual exam reveals minimal discomfort, no adnexal fullness or specific adnexal tenderness.   Musculoskeletal: Normal range of motion.   Neurological: She is alert and oriented to person, place, and time.   Skin: Skin is warm and dry.   Psychiatric: She has a normal mood and affect. Her behavior is normal. Judgment and thought content normal.   Nursing note and vitals reviewed.      Procedures    MDM    MEDICAL DECISION  MAKING    Vitals:    Filed Vitals:    10/23/13 1750   BP: 113/61   Pulse: 74   Temp: 98.3 ??F (36.8 ??C)   TempSrc: Oral   Resp: 16   Height: 5\' 9"  (1.753 m)   Weight: 135 lb (61.236 kg)   SpO2: 99%       LABS:   Labs Reviewed   URINE RT REFLEX TO CULTURE - Abnormal; Notable for the following:     Blood, Urine LARGE (*)     Leukocyte Esterase, Urine SMALL (*)     All other components within normal limits   MICROSCOPIC URINALYSIS - Abnormal; Notable for the following:     WBC, UA 6-10 (*)     RBC, UA 5-10 (*)     Bacteria, UA 2+ (*)     All other components within normal limits   CBC WITH AUTO DIFFERENTIAL - Abnormal; Notable for the following:     RBC 3.97 (*)     Hematocrit 35.9 (*)     Tear Drop Cells Occasional (*)     All other components within normal limits   BASIC METABOLIC PANEL - Abnormal; Notable for the following:     Sodium 135 (*)     All other components within normal limits   WET PREP, GENITAL   URINE CULTURE   C. TRACHOMATIS / N. GONORRHOEAE, DNA PROBE   PREGNANCY, URINE   HCG, QUANTITATIVE, PREGNANCY   ABO/RH        Remainder of labs reviewed and were negative at this time or not returned at the time of this note.    Orders:  Orders Placed This Encounter   Procedures   ??? Urine Culture   ??? Wet prep, genital   ??? C. Trachomatis / N. Gonorrhoeae, DNA Probe   ??? US OB TRANSVAGINAL   ??? Urine, reflex to culture   ??? Pregnancy, urine   ??? Microscopic Urinalysis   ??? CBC Auto Differential   ??? Basic Metabolic Panel   ??? hCG, quantitative, pregnancy   ??? hCG, quantitative, pregnancy   ??? Vaginal exam   ??? ABO/RH       EKG: Interpreted by the attending and myself in the absence of a real-time interpretation by the cardiologist.   N/A    RADIOLOGY:   Non-plain film images such as CT, Ultrasound and MRI are read by the radiologist. The attending, Donney Dice, MD, and I, Rick Duff, PA, have visualized the radiologic plain film image(s) with the below findings:    The patient's transvaginal ultrasound as interpreted  by the on-duty radiologist shows a 5 week 3 day gestation IUP.  No evidence of tubal pregnancy.  Follow-up was recommended.  Interpretation per the Radiologist below, if available at the time of this note:    US OB TRANSVAGINAL    Final Result: Impression:          Gestational sac within the endometrial canal with size correlating    with a 5 week 3 day gestation. Fetal pole is not visualized at    this time, possibly due to early gestational age. Ectopic    pregnancy cannot be completely excluded. Followup is recommended.                       US Ob Transvaginal    10/23/2013   Indication: Pregnant patient, pelvic pain.   Comparison: None available.   Findings:   Sonography of the pelvis was performed using transvaginal technique. Within the endometrial canal, there is a gestational sac noted containing only a yolk sac. The mean sac diameter measures 0.79 cm, correlating with a 5 week 3 day gestation. Yolk sac measures 0.42 cm.   Both ovaries are within normal limits in size and echo texture. The right ovary measures 3.5 x 1.6 x 2.2 cm. The left adnexa measures 3.1 x 1.8 x 1.7 cm. Doppler blood flow is present within the both ovaries.   The left parametrial vasculature is somewhat prominent. No free fluid is seen.      10/23/2013   Impression:   Gestational sac within the endometrial canal with size correlating with a 5 week 3 day gestation. Fetal pole is not visualized at this time, possibly due to early gestational age. Ectopic pregnancy cannot be completely excluded. Followup is recommended.               PROCEDURES: N/A    CRITICAL CARE:  N/A    CONSULTATIONS: N/A    MEDICAL DECISION MAKING / ED COURSE:    Patient was given the following medications:  Medications - No data to display    The patient does have 1 intrauterine pregnancy.  Yolk sac noted.  No fetal pole at this time.  Estimated gestation at this time is 5 weeks and 3 days.  No evidence of tubal pregnancy.  On clinical exam the patient had minimal left  lower quadrant abdominal tenderness.  On a manual pelvic exam she had very little tenderness throughout the exam.  Cervix was closed.  No bleeding.  Very minimal discharge noted.  No evidence of blood or clot in the vaginal vault.  Patient's UA negative.  Patient's beta Quant [id number].  Patient was instructed to contact [facility] tomorrow for an appointment on Friday.  Patient is aware she will need a repeat beta quantitative value on Friday to determine progression of pregnancy.  The patient is understanding of the diagnosis and treatment plan.    The patient tolerated their visit well.  They were seen and evaluated by the attending physician who agreed with the assessment and plan.  The patient and / or the family were informed of the results of any tests, a time was given to answer questions, a plan was proposed and they agreed with plan.      CLINICAL IMPRESSION:    1. Intrauterine pregnancy        DISPOSITION Decision to Discharge    PATIENT REFERRED TO:  [facility]  [address]  [place] [address]    Schedule an appointment as soon as possible for a visit in 2 days  Call tomorrow for an appointment  on Friday.      DISCHARGE MEDICATIONS:  There are no discharge medications for this patient.      (Please note the MDM and HPI sections of this note were completed with a voice recognition program.  Efforts were made to edit the dictations but occasionally words are mis-transcribed.)    Rick DuffMichael Skye Plamondon, PA            Rick DuffMichael Elianna Windom, [place]  10/24/13 [phone]

## 2013-10-23 NOTE — ED Notes (Signed)
Patient back from [place].     Nichola Sizer, RN  10/23/13 2129

## 2013-10-23 NOTE — ED Provider Notes (Signed)
2015    I have personally performed a face to face diagnostic evaluation on this patient. I have fully participated in the care of this patient. I have reviewed and agree with all pertinent clinical information including history, physical exam, diagnostic tests, and the plan.     History and Physical as follows:  23 year old female states she is pregnant with last menstrual period early to mid November.  Yesterday she had some slight vaginal bleeding using only one pad.  She had some slight cramping.  She feels better today.  Denies fever or chills.  She has no vomiting.  She has not seen any bleeding today.  Appears nontoxic and comfortable.  Abdomen soft and nontender on my exam.  Pelvic exam done by the physician assistant shows a closed cervix with no bleeding.  CBC and BMP were normal.  Serum beta was 5812.  She is B+.  Transvaginal ultrasound read by the radiologist on duty shows a 5 week 3 day IUP with yolk sac visible but no fetal pole or fetal heart activity yet.  Urinalysis shows 6-10 white cells with 5-10 red cells. Urine C&S was sent.    Donney DiceMichael A Elena Davia, MD  10/24/13 [phone]

## 2013-10-23 NOTE — ED Notes (Signed)
Patient given discharge instructions, verbalized understanding, denies any questions at this time. Patient discharged home.     Mason JimNavonda R Tomi Paddock, RN  10/23/13 [phone]

## 2013-10-23 NOTE — ED Notes (Signed)
Patient being taken to US.     Mason JimNavonda R Zamyra Allensworth, RN  10/23/13 [phone]

## 2013-10-24 LAB — MICROSCOPIC URINALYSIS

## 2013-10-24 LAB — CBC WITH AUTO DIFFERENTIAL
Basophils %: 0 %
Basophils Absolute: 0 10*3/uL (ref 0.0–0.2)
Eosinophils %: 1 %
Eosinophils Absolute: 0.1 10*3/uL (ref 0.0–0.6)
Hematocrit: 35.9 % — ABNORMAL LOW (ref 36.0–48.0)
Hemoglobin: 12.1 g/dL (ref 12.0–16.0)
Lymphocytes %: 33 %
Lymphocytes Absolute: 2.7 10*3/uL (ref 1.0–5.1)
MCH: 30.5 pg (ref 26.0–34.0)
MCHC: 33.7 g/dL (ref 31.0–36.0)
MCV: 90.6 fL (ref 80.0–100.0)
MPV: 10.4 fL (ref 5.0–10.5)
Monocytes %: 7 %
Monocytes Absolute: 0.6 10*3/uL (ref 0.0–1.3)
Neutrophils %: 59 %
Neutrophils Absolute: 4.9 10*3/uL (ref 1.7–7.7)
PLATELET SLIDE REVIEW: ADEQUATE
Platelets: 167 10*3/uL (ref 135–450)
RBC: 3.97 M/uL — ABNORMAL LOW (ref 4.00–5.20)
RDW: 14.1 % (ref 12.4–15.4)
WBC: 8.3 10*3/uL (ref 4.0–11.0)

## 2013-10-24 LAB — BASIC METABOLIC PANEL
Anion Gap: 12 (ref 3–16)
BUN: 14 mg/dL (ref 7–20)
CO2: 21 mmol/L (ref 21–32)
Calcium: 9 mg/dL (ref 8.3–10.6)
Chloride: 102 mmol/L (ref 99–110)
Creatinine: 0.6 mg/dL (ref 0.6–1.1)
GFR African American: >60 (ref >60)
GFR Non-African American: >60 (ref >60)
Glucose: 90 mg/dL (ref 70–99)
Potassium: 4.4 mmol/L (ref 3.5–5.1)
Sodium: 135 mmol/L — ABNORMAL LOW (ref 136–145)

## 2013-10-24 LAB — WET PREP, GENITAL
Clue Cells, Wet Prep: NONE SEEN
Trichomonas Prep: NONE SEEN
Yeast, Wet Prep: NONE SEEN

## 2013-10-24 LAB — HCG, QUANTITATIVE, PREGNANCY: hCG Quant: 5812 m[IU]/mL (ref <5.0)

## 2013-10-24 LAB — ABO/RH: ABO/Rh: B POS

## 2014-02-20 ENCOUNTER — Inpatient Hospital Stay (HOSPITAL_COMMUNITY)
Admission: AD | Admit: 2014-02-20 | Discharge: 2014-02-20 | Disposition: A | Discharge status: Home / Self Care | Payer: Medicaid Other | Source: Ambulatory Visit | Attending: Obstetrics and Gynecology | Admitting: Obstetrics and Gynecology

## 2014-02-20 ENCOUNTER — Encounter (HOSPITAL_COMMUNITY): Payer: Self-pay | Admitting: *Deleted

## 2014-02-20 DIAGNOSIS — O0930 Supervision of pregnancy with insufficient antenatal care, unspecified trimester: Secondary | ICD-10-CM | POA: Insufficient documentation

## 2014-02-20 DIAGNOSIS — O99330 Smoking (tobacco) complicating pregnancy, unspecified trimester: Secondary | ICD-10-CM | POA: Insufficient documentation

## 2014-02-20 DIAGNOSIS — O4700 False labor before 37 completed weeks of gestation, unspecified trimester: Secondary | ICD-10-CM | POA: Insufficient documentation

## 2014-02-20 DIAGNOSIS — R109 Unspecified abdominal pain: Secondary | ICD-10-CM | POA: Insufficient documentation

## 2014-02-20 DIAGNOSIS — O26899 Other specified pregnancy related conditions, unspecified trimester: Secondary | ICD-10-CM

## 2014-02-20 HISTORY — DX: Chlamydial infection, unspecified: A74.9

## 2014-02-20 LAB — URINALYSIS, ROUTINE W REFLEX MICROSCOPIC
Bilirubin Urine: NEGATIVE
Glucose, UA: NEGATIVE mg/dL
Hgb urine dipstick: NEGATIVE
Ketones, ur: NEGATIVE mg/dL
LEUKOCYTES UA: NEGATIVE
NITRITE: NEGATIVE
PH: 6.5 (ref 5.0–8.0)
Protein, ur: NEGATIVE mg/dL
SPECIFIC GRAVITY, URINE: 1.02 (ref 1.005–1.030)
Urobilinogen, UA: 0.2 mg/dL (ref 0.0–1.0)

## 2014-02-20 NOTE — MAU Note (Signed)
Urine in lab 

## 2014-02-20 NOTE — MAU Provider Note (Signed)
History     CSN: [id number]  Arrival date and time: 02/20/14 1521   None     No chief complaint on file.  HPI This is a 23 y.o. female at 4491w6d who presents with c/o pressure and cramping. This started today. Has had no prenatal care. Had a 5 week US in an ER where she used to live. Has had no care here.  Plans care with Dr Vincente PoliGrewal.  RN Note: PT SAYS SHE IS FROM [place]. [place] W[place]OSP[place] . - DID U/S - 5 WEEKS - ON 10-23-2013- FOR BLEEDING. SHE MOVED HERE IN [place]- NO CARE. [place] LAST SEX- 5-4. DENIES HSV AND MRSA. FEELS PRESSURE AND CRAMPING - STARTED TODAY.        OB History   Grav Para Term Preterm Abortions TAB SAB Ect Mult Living   3 1 1       1       Past Medical History  Diagnosis Date  . Trichomonas contact, treated   . Chlamydia     Past Surgical History  Procedure Laterality Date  . No past surgeries      Family History  Problem Relation Age of Onset  . Drug abuse Mother   . Drug abuse Father     History  Substance Use Topics  . Smoking status: Current Every Day Smoker -- 0.25 packs/day for 5 years    Types: Cigarettes    Last Attempt to Quit: 05/05/2012  . Smokeless tobacco: Not on file  . Alcohol Use: No     Comment: Not currently    Allergies: No Known Allergies  Prescriptions prior to admission  Medication Sig Dispense Refill  . nystatin cream (MYCOSTATIN) Apply 1 application topically daily as needed for dry skin (For yeast.).        Review of Systems  Constitutional: Negative for fever, chills and malaise/fatigue.  Gastrointestinal: Positive for abdominal pain. Negative for nausea, vomiting, diarrhea and constipation.  Genitourinary: Negative for dysuria.  Neurological: Negative for headaches.   Physical Exam   Blood pressure 117/62, pulse 79, temperature 98 F (36.7 C), temperature source Oral, resp. rate 18, height 5\' 8"  (1.727 m), weight 68.266 kg (150 lb 8 oz), last menstrual period 08/17/2013, not  currently breastfeeding.  Physical Exam  Constitutional: She is oriented to person, place, and time. She appears well-developed and well-nourished. No distress.  Cardiovascular: Normal rate.   Respiratory: Effort normal.  GI: Soft. There is no tenderness. There is no rebound and no guarding.  Genitourinary: Vagina normal and uterus normal. No vaginal discharge found.  Dilation: Closed Effacement (%): Thick Station: Ballotable Exam by:: Artelia LarocheM. Sasha Rogel, CNM   Musculoskeletal: Normal range of motion.  Neurological: She is alert and oriented to person, place, and time.  Skin: Skin is warm and dry.  Psychiatric: She has a normal mood and affect.   FHR reactive No contractions, some irritability  Dilation: Closed Effacement (%): Thick Station: Ballotable Exam by:: Artelia LarocheM. Kaydyn Chism, CNM  MAU Course  Procedures  MDM Results for orders placed during the hospital encounter of 02/20/14 (from the past 72 hour(s))  URINALYSIS, ROUTINE W REFLEX MICROSCOPIC     Status: None   Collection Time    02/20/14  4:11 PM      Result Value Ref Range   Color, Urine YELLOW  YELLOW   APPearance CLEAR  CLEAR   Specific Gravity, Urine 1.020  1.005 - 1.030   pH 6.5  5.0 -  8.0   Glucose, UA NEGATIVE  NEGATIVE mg/dL   Hgb urine dipstick NEGATIVE  NEGATIVE   Bilirubin Urine NEGATIVE  NEGATIVE   Ketones, ur NEGATIVE  NEGATIVE mg/dL   Protein, ur NEGATIVE  NEGATIVE mg/dL   Urobilinogen, UA 0.2  0.0 - 1.0 mg/dL   Nitrite NEGATIVE  NEGATIVE   Leukocytes, UA NEGATIVE  NEGATIVE   Comment: MICROSCOPIC NOT DONE ON URINES WITH NEGATIVE PROTEIN, BLOOD, LEUKOCYTES, NITRITE, OR GLUCOSE <1000 mg/dL.     Assessment and Plan  A:  SIUP at 7944w6d       Uterine irritability with no cervical change  P:  DIscharge home      [facility]  Aviva SignsMarie L Chudney Scheffler 02/20/2014, 5:48 PM

## 2014-02-20 NOTE — MAU Note (Addendum)
PT SAYS SHE  IS FROM [place].   SHE WENT TO HOSP[place]   [place] .  - DID U/S - 5 WEEKS -  ON 10-23-2013-   FOR BLEEDING.    SHE MOV[place] HERE IN [place]-       NO CARE.     [place]     LAST SEX-   5-4.    DENIES HSV AND MRSA.   FEELS PRESSURE    AND CRAMPING   -  STARTED TODAY.

## 2014-02-20 NOTE — Discharge Instructions (Signed)
Abdominal Pain During Pregnancy °Abdominal pain is common in pregnancy. Most of the time, it does not cause harm. There are many causes of abdominal pain. Some causes are more serious than others. Some of the causes of abdominal pain in pregnancy are easily diagnosed. Occasionally, the diagnosis takes time to understand. Other times, the cause is not determined. Abdominal pain can be a sign that something is very wrong with the pregnancy, or the pain may have nothing to do with the pregnancy at all. For this reason, always tell your health care provider if you have any abdominal discomfort. °HOME CARE INSTRUCTIONS  °Monitor your abdominal pain for any changes. The following actions may help to alleviate any discomfort you are experiencing: °· Do not have sexual intercourse or put anything in your vagina until your symptoms go away completely. °· Get plenty of rest until your pain improves. °· Drink clear fluids if you feel nauseous. Avoid solid food as long as you are uncomfortable or nauseous. °· Only take over-the-counter or prescription medicine as directed by your health care provider. °· Keep all follow-up appointments with your health care provider. °SEEK IMMEDIATE MEDICAL CARE IF: °· You are bleeding, leaking fluid, or passing tissue from the vagina. °· You have increasing pain or cramping. °· You have persistent vomiting. °· You have painful or bloody urination. °· You have a fever. °· You notice a decrease in your baby's movements. °· You have extreme weakness or feel faint. °· You have shortness of breath, with or without abdominal pain. °· You develop a severe headache with abdominal pain. °· You have abnormal vaginal discharge with abdominal pain. °· You have persistent diarrhea. °· You have abdominal pain that continues even after rest, or gets worse. °MAKE SURE YOU:  °· Understand these instructions. °· Will watch your condition. °· Will get help right away if you are not doing well or get  worse. °Document Released: 10/03/2005 Document Revised: 07/24/2013 Document Reviewed: 05/02/2013 °ExitCare® Patient Information ©2014 [employer]. ° °

## 2014-02-22 ENCOUNTER — Encounter (HOSPITAL_COMMUNITY): Payer: Self-pay | Admitting: Advanced Practice Midwife

## 2014-02-24 NOTE — MAU Provider Note (Signed)
Attestation of Attending Supervision of Advanced Practitioner (CNM/NP): Evaluation and management procedures were performed by the Advanced Practitioner under my supervision and collaboration.  I have reviewed the Advanced Practitioner's note and chart, and I agree with the management and plan.  Suzetta Timko 02/24/2014 7:55 AM

## 2014-04-14 ENCOUNTER — Inpatient Hospital Stay (HOSPITAL_COMMUNITY)
Admission: AD | Admit: 2014-04-14 | Discharge: 2014-04-14 | Disposition: A | Discharge status: Home / Self Care | Payer: Medicaid Other | Source: Ambulatory Visit | Attending: Obstetrics | Admitting: Obstetrics

## 2014-04-14 ENCOUNTER — Encounter (HOSPITAL_COMMUNITY): Payer: Self-pay

## 2014-04-14 DIAGNOSIS — R109 Unspecified abdominal pain: Secondary | ICD-10-CM | POA: Insufficient documentation

## 2014-04-14 DIAGNOSIS — M79609 Pain in unspecified limb: Secondary | ICD-10-CM | POA: Insufficient documentation

## 2014-04-14 DIAGNOSIS — N898 Other specified noninflammatory disorders of vagina: Secondary | ICD-10-CM | POA: Insufficient documentation

## 2014-04-14 DIAGNOSIS — Z87891 Personal history of nicotine dependence: Secondary | ICD-10-CM | POA: Insufficient documentation

## 2014-04-14 DIAGNOSIS — O99891 Other specified diseases and conditions complicating pregnancy: Secondary | ICD-10-CM | POA: Insufficient documentation

## 2014-04-14 DIAGNOSIS — O9989 Other specified diseases and conditions complicating pregnancy, childbirth and the puerperium: Principal | ICD-10-CM

## 2014-04-14 DIAGNOSIS — A64 Unspecified sexually transmitted disease: Secondary | ICD-10-CM

## 2014-04-14 LAB — URINALYSIS, ROUTINE W REFLEX MICROSCOPIC
BILIRUBIN URINE: NEGATIVE
Glucose, UA: NEGATIVE mg/dL
Hgb urine dipstick: NEGATIVE
Ketones, ur: NEGATIVE mg/dL
Leukocytes, UA: NEGATIVE
NITRITE: NEGATIVE
Protein, ur: NEGATIVE mg/dL
UROBILINOGEN UA: 0.2 mg/dL (ref 0.0–1.0)
pH: 7 (ref 5.0–8.0)

## 2014-04-14 LAB — WET PREP, GENITAL
Clue Cells Wet Prep HPF POC: NONE SEEN
TRICH WET PREP: NONE SEEN
YEAST WET PREP: NONE SEEN

## 2014-04-14 LAB — OB RESULTS CONSOLE GC/CHLAMYDIA
Chlamydia: NEGATIVE
GC PROBE AMP, GENITAL: NEGATIVE

## 2014-04-14 MED ORDER — AZITHROMYCIN 250 MG PO TABS
1000.0000 mg | ORAL_TABLET | Freq: Every day | ORAL | Status: DC
  Administered 2014-04-14: 1000 mg via ORAL
  Filled 2014-04-14: qty 4

## 2014-04-14 NOTE — MAU Note (Signed)
Pt suspects that she has "another STD infection"; Crying and has her one year old with her;

## 2014-04-14 NOTE — MAU Provider Note (Signed)
History     CSN: [id number]  Arrival date and time: 04/14/14 [id number]   First Provider Initiated Contact with Patient 04/14/14 1202      Chief Complaint  Patient presents with  . Leg Pain  . Abdominal Cramping  . Vaginal Discharge   HPI Lauren Mcclain is a 23 y.o. G2P1001 at 2918w3d who presents to MAU today with complaint of abdominal cramping and vaginal discharge. The patient states that discharge is thick and white. She denies vaginal bleeding or LOF. She states recent history of chlamydia treated by Dr. Gaynell FaceMarshall. Her partner was not treated and she has had intercourse with him again. She reports occasional tightening of the abdomen without regular contractions. She reports good fetal movement.   OB History   Grav Para Term Preterm Abortions TAB SAB Ect Mult Living   2 1 1       1       Past Medical History  Diagnosis Date  . Trichomonas contact, treated   . Chlamydia     Past Surgical History  Procedure Laterality Date  . No past surgeries      Family History  Problem Relation Age of Onset  . Drug abuse Mother   . Drug abuse Father     History  Substance Use Topics  . Smoking status: Former Smoker -- 0.25 packs/day for 5 years    Types: Cigarettes    Quit date: 05/05/2012  . Smokeless tobacco: Not on file  . Alcohol Use: No     Comment: Not currently    Allergies: No Known Allergies  No prescriptions prior to admission    Review of Systems  Constitutional: Negative for fever.  Gastrointestinal: Positive for abdominal pain.  Genitourinary: Negative for dysuria, urgency and frequency.       + vaginal discharge Neg - vaginal bleeding   Physical Exam   Blood pressure 107/61, pulse 76, temperature 98.4 F (36.9 C), temperature source Oral, resp. rate 18, last menstrual period 08/17/2013, SpO2 100.00%, not currently breastfeeding.  Physical Exam  Constitutional: She is oriented to person, place, and time. She appears well-developed and well-nourished.  No distress.  HENT:  Head: Normocephalic.  Cardiovascular: Normal rate.   Respiratory: Effort normal.  GI: Soft. She exhibits no distension. There is no tenderness.  Genitourinary: Uterus is enlarged (aprropriate for GA). Cervix exhibits no motion tenderness, no discharge and no friability. No bleeding around the vagina. Vaginal discharge (moderate amount of thin, white discharge noted) found.  Cervix: closed, thick, soft  Neurological: She is alert and oriented to person, place, and time.  Skin: Skin is warm and dry. No erythema.  Psychiatric: She has a normal mood and affect.   Results for orders placed during the hospital encounter of 04/14/14 (from the past 24 hour(s))  URINALYSIS, ROUTINE W REFLEX MICROSCOPIC     Status: Abnormal   Collection Time    04/14/14 10:15 AM      Result Value Ref Range   Color, Urine YELLOW  YELLOW   APPearance CLEAR  CLEAR   Specific Gravity, Urine <1.005 (*) 1.005 - 1.030   pH 7.0  5.0 - 8.0   Glucose, UA NEGATIVE  NEGATIVE mg/dL   Hgb urine dipstick NEGATIVE  NEGATIVE   Bilirubin Urine NEGATIVE  NEGATIVE   Ketones, ur NEGATIVE  NEGATIVE mg/dL   Protein, ur NEGATIVE  NEGATIVE mg/dL   Urobilinogen, UA 0.2  0.0 - 1.0 mg/dL   Nitrite NEGATIVE  NEGATIVE   Leukocytes, UA  NEGATIVE  NEGATIVE  WET PREP, GENITAL     Status: Abnormal   Collection Time    04/14/14 12:10 PM      Result Value Ref Range   Yeast Wet Prep HPF POC NONE SEEN  NONE SEEN   Trich, Wet Prep NONE SEEN  NONE SEEN   Clue Cells Wet Prep HPF POC NONE SEEN  NONE SEEN   WBC, Wet Prep HPF POC MODERATE (*) NONE SEEN   Fetal Monitoring: Baseline: 135 bpm, moderate variability, + accelerations, no decelerations Contractions: mild UI MAU Course  Procedures None  MDM UA, wet prep and GC/Chlamydia Due to likelihood of re-infection with chlamydia patient is treated with 1G Zithromax in MAU today  Assessment and Plan  A: SIUP at 7272w3d Concern for STD  P: Discharge home Partner  treatment advised. Patient encouraged to wait 2 weeks from time of partner treatment before next sexual intercourse Preterm labor warning signs discussed Patient advised to follow-up with Dr. Gaynell FaceMarshall as scheduled for routine prenatal care Patient may return to MAU as needed or if her condition were to change or worsen  Freddi StarrJulie N Ethier, PA-C  04/14/2014, 12:52 PM

## 2014-04-14 NOTE — Discharge Instructions (Signed)
Sexually Transmitted Disease A sexually transmitted disease (STD) is a disease or infection often passed to another person during sex. However, STDs can be passed through nonsexual ways. An STD can be passed through:  Spit (saliva).  Semen.  Blood.  Mucus from the vagina.  Pee (urine). HOW CAN I LESSEN MY CHANCES OF GETTING AN STD?  Use:  Latex condoms.  Water-soluble lubricants with condoms. Do not use petroleum jelly or oils.  Dental dams. These are small pieces of latex that are used as a barrier during oral sex.  Avoid having more than one sex partner.  Do not have sex with someone who has other sex partners.  Do not have sex with anyone you do not know or who is at high risk for an STD.  Avoid risky sex that can break your skin.  Do not have sex if you have open sores on your mouth or skin.  Avoid drinking too much alcohol or taking illegal drugs. Alcohol and drugs can affect your good judgment.  Avoid oral and anal sex acts.  Get shots (vaccines) for HPV and hepatitis.  If you are at risk of being infected with HIV, it is advised that you take a certain medicine daily to prevent HIV infection. This is called pre-exposure prophylaxis (PrEP). You may be at risk if:  You are a man who has sex with other men (MSM).  You are attracted to the opposite sex (heterosexual) and are having sex with more than one partner.  You take drugs with a needle.  You have sex with someone who has HIV.  Talk with your doctor about if you are at high risk of being infected with HIV. If you begin to take PrEP, get tested for HIV first. Get tested every 3 months for as long as you are taking PrEP. WHAT SHOULD I DO IF I THINK I HAVE AN STD?  See your doctor.  Tell your sex partner(s) that you have an STD. They should be tested and treated.  Do not have sex until your doctor says it is okay. WHEN SHOULD I GET HELP? Get help right away if:  You have bad belly (abdominal)  pain.  You are a man and have puffiness (swelling) or pain in your testicles.  You are a woman and have puffiness in your vagina. Document Released: 11/10/2004 Document Revised: 10/08/2013 Document Reviewed: 03/29/2013 ExitCare Patient Information 2015 [employer]. This information is not intended to replace advice given to you by your health care provider. Make sure you discuss any questions you have with your health care provider.  

## 2014-04-14 NOTE — MAU Note (Signed)
Patient states she has been having leg cramping, abdominal cramping and a vaginal discharge for about one week. Thinks she might have and STI.

## 2014-04-15 LAB — GC/CHLAMYDIA PROBE AMP
CT Probe RNA: NEGATIVE
GC PROBE AMP APTIMA: NEGATIVE

## 2014-05-29 ENCOUNTER — Encounter: Payer: Medicaid Other | Admitting: Family Medicine

## 2014-06-03 ENCOUNTER — Encounter (HOSPITAL_COMMUNITY): Payer: Self-pay | Admitting: *Deleted

## 2014-06-03 ENCOUNTER — Inpatient Hospital Stay (HOSPITAL_COMMUNITY)
Admission: AD | Admit: 2014-06-03 | Discharge: 2014-06-03 | Disposition: A | Discharge status: Home / Self Care | Payer: Medicaid Other | Source: Ambulatory Visit | Attending: Obstetrics & Gynecology | Admitting: Obstetrics & Gynecology

## 2014-06-03 DIAGNOSIS — O3680X0 Pregnancy with inconclusive fetal viability, not applicable or unspecified: Secondary | ICD-10-CM | POA: Diagnosis not present

## 2014-06-03 DIAGNOSIS — O0930 Supervision of pregnancy with insufficient antenatal care, unspecified trimester: Secondary | ICD-10-CM

## 2014-06-03 DIAGNOSIS — O9989 Other specified diseases and conditions complicating pregnancy, childbirth and the puerperium: Principal | ICD-10-CM

## 2014-06-03 DIAGNOSIS — O99891 Other specified diseases and conditions complicating pregnancy: Secondary | ICD-10-CM

## 2014-06-03 LAB — URINALYSIS, ROUTINE W REFLEX MICROSCOPIC
BILIRUBIN URINE: NEGATIVE
Glucose, UA: NEGATIVE mg/dL
Hgb urine dipstick: NEGATIVE
Ketones, ur: NEGATIVE mg/dL
NITRITE: NEGATIVE
PH: 7 (ref 5.0–8.0)
Protein, ur: NEGATIVE mg/dL
SPECIFIC GRAVITY, URINE: 1.015 (ref 1.005–1.030)
Urobilinogen, UA: 1 mg/dL (ref 0.0–1.0)

## 2014-06-03 LAB — URINE MICROSCOPIC-ADD ON

## 2014-06-03 NOTE — MAU Provider Note (Signed)
  History     CSN: [id number]  Arrival date and time: 06/03/14 1336   None   Chief Complaint  Patient presents with  . Non-stress Test   HPI Comments: Ms. Abbe AmsterdamHolsey is a G2P1 reportedly by a 5 week U/S presenting "to see if my baby is okay" and to request paperwork to be filled out for social security. Good fetal movement. Patient denies LOF, vagina bleeding, contractions, headache, dizziness, abdominal pain, chest pain, or SOB.  Saw Dr. Gaynell FaceMarshall in 02/2014 and notes she attempted to change providers to the health department but they told her she was too far along.    OB History   Grav Para Term Preterm Abortions TAB SAB Ect Mult Living   2 1 1       1       Past Medical History  Diagnosis Date  . Trichomonas contact, treated   . Chlamydia     Past Surgical History  Procedure Laterality Date  . No past surgeries      Family History  Problem Relation Age of Onset  . Drug abuse Mother   . Drug abuse Father     History  Substance Use Topics  . Smoking status: Former Smoker -- 0.25 packs/day for 5 years    Types: Cigarettes    Quit date: 05/05/2012  . Smokeless tobacco: Not on file  . Alcohol Use: No     Comment: Not currently    Allergies: No Known Allergies  No prescriptions prior to admission    ROS Physical Exam   Blood pressure 121/68, pulse 99, temperature 98.8 F (37.1 C), temperature source Oral, resp. rate 16, height 5\' 8"  (1.727 m), weight 160 lb 12.8 oz (72.938 kg), last menstrual period 08/17/2013, SpO2 100.00%, not currently breastfeeding.  Physical Exam  Constitutional: She is oriented to person, place, and time. She appears well-developed.  HENT:  Head: Normocephalic and atraumatic.  Cardiovascular: Normal rate, regular rhythm, normal heart sounds and intact distal pulses.  Exam reveals no gallop and no friction rub.   No murmur heard. Respiratory: No respiratory distress. She has no wheezes. She has no rales.  Musculoskeletal: She exhibits  edema.  Neurological: She is alert and oriented to person, place, and time. She has normal reflexes.  Skin: Skin is warm and dry.  Psychiatric: She has a normal mood and affect.  Fundal height: 36cm  SVE: Deferred  FHTS: Baseline 140s. Good varibility 15x15 accelerations. No decelerations noted.  MAU Course  Procedures : GBS screen obtained   Assessment and Plan  23 y/o G2P1 at 37+6 presenting with no complaints requesting social security paperwork. - No signs/symptoms of labor.  - Normal fetal wellbeing - Will obtain GBS screen - Strongly urged pt to f/u with Dr. Gaynell FaceMarshall as an outpatient- will defer to him for paperwork   [facility],Marvia Troost 06/03/2014, 6:11 PM (pt seen by Dr. Loreta AveAcosta)

## 2014-06-03 NOTE — Discharge Instructions (Signed)
Third Trimester of Pregnancy The third trimester is from week 29 through week 42, months 7 through 9. This trimester is when your unborn baby (fetus) is growing very fast. At the end of the ninth month, the unborn baby is about 20 inches in length. It weighs about 6-10 pounds.  HOME CARE   Avoid all smoking, herbs, and alcohol. Avoid drugs not approved by your doctor.  Only take medicine as told by your doctor. Some medicines are safe and some are not during pregnancy.  Exercise only as told by your doctor. Stop exercising if you start having cramps.  Eat regular, healthy meals.  Wear a good support bra if your breasts are tender.  Do not use hot tubs, steam rooms, or saunas.  Wear your seat belt when driving.  Avoid raw meat, uncooked cheese, and liter boxes and soil used by cats.  Take your prenatal vitamins.  Try taking medicine that helps you poop (stool softener) as needed, and if your doctor approves. Eat more fiber by eating fresh fruit, vegetables, and whole grains. Drink enough fluids to keep your pee (urine) clear or pale yellow.  Take warm water baths (sitz baths) to soothe pain or discomfort caused by hemorrhoids. Use hemorrhoid cream if your doctor approves.  If you have puffy, bulging veins (varicose veins), wear support hose. Raise (elevate) your feet for 15 minutes, 3-4 times a day. Limit salt in your diet.  Avoid heavy lifting, wear low heels, and sit up straight.  Rest with your legs raised if you have leg cramps or low back pain.  Visit your dentist if you have not gone during your pregnancy. Use a soft toothbrush to brush your teeth. Be gentle when you floss.  You can have sex (intercourse) unless your doctor tells you not to.  Do not travel far distances unless you must. Only do so with your doctor's approval.  Take prenatal classes.  Practice driving to the hospital.  Pack your hospital bag.  Prepare the baby's room.  Go to your doctor visits. GET  HELP IF:  You are not sure if you are in labor or if your water has broken.  You are dizzy.  You have mild cramps or pressure in your lower belly (abdominal).  You have a nagging pain in your belly area.  You continue to feel sick to your stomach (nauseous), throw up (vomit), or have watery poop (diarrhea).  You have bad smelling fluid coming from your vagina.  You have pain with peeing (urination). GET HELP RIGHT AWAY IF:   You have a fever.  You are leaking fluid from your vagina.  You are spotting or bleeding from your vagina.  You have severe belly cramping or pain.  You lose or gain weight rapidly.  You have trouble catching your breath and have chest pain.  You notice sudden or extreme puffiness (swelling) of your face, hands, ankles, feet, or legs.  You have not felt the baby move in over an hour.  You have severe headaches that do not go away with medicine.  You have vision changes. Document Released: 12/28/2009 Document Revised: 01/28/2013 Document Reviewed: 12/04/2012 ExitCare Patient Information 2015 [employer]. This information is not intended to replace advice given to you by your health care provider. Make sure you discuss any questions you have with your health care provider.  

## 2014-06-03 NOTE — MAU Note (Signed)
Patient states she has had no prenatal care and is near her due date and just wants to be seen. Denies any pain, bleeding, leaking, nausea or vomiting. Reports good fetal movement.

## 2014-06-03 NOTE — MAU Note (Signed)
Pt states she does not have a primary MD and she has not had any prenatal care. Pt states wants a check up

## 2014-06-06 LAB — OB RESULTS CONSOLE GBS: STREP GROUP B AG: NEGATIVE

## 2014-06-06 LAB — CULTURE, BETA STREP (GROUP B ONLY)

## 2014-06-13 ENCOUNTER — Inpatient Hospital Stay (HOSPITAL_COMMUNITY)
Admission: AD | Admit: 2014-06-13 | Discharge: 2014-06-16 | DRG: 775 | Disposition: A | Discharge status: Home / Self Care | Payer: Medicaid Other | Source: Ambulatory Visit | Attending: Obstetrics | Admitting: Obstetrics

## 2014-06-13 ENCOUNTER — Encounter (HOSPITAL_COMMUNITY): Payer: Self-pay | Admitting: *Deleted

## 2014-06-13 DIAGNOSIS — IMO0001 Reserved for inherently not codable concepts without codable children: Secondary | ICD-10-CM

## 2014-06-13 DIAGNOSIS — O0930 Supervision of pregnancy with insufficient antenatal care, unspecified trimester: Secondary | ICD-10-CM

## 2014-06-13 DIAGNOSIS — Z87891 Personal history of nicotine dependence: Secondary | ICD-10-CM

## 2014-06-13 NOTE — MAU Note (Signed)
Contractions since yesterday. Leaking fld all wk but more since yesterday. Contractions about 5-67mins apart

## 2014-06-13 NOTE — MAU Note (Signed)
SAYS SHE WAS  AT DR MARSHALL-  OFFICE  - CLOSED.    HURT BAD  SINCE .  DENIES HSV AND MRSA.   GBS- NEG

## 2014-06-14 ENCOUNTER — Encounter (HOSPITAL_COMMUNITY): Payer: Self-pay

## 2014-06-14 ENCOUNTER — Encounter (HOSPITAL_COMMUNITY): Payer: Medicaid Other | Admitting: Anesthesiology

## 2014-06-14 ENCOUNTER — Inpatient Hospital Stay (HOSPITAL_COMMUNITY): Payer: Medicaid Other | Admitting: Anesthesiology

## 2014-06-14 DIAGNOSIS — O479 False labor, unspecified: Secondary | ICD-10-CM | POA: Diagnosis present

## 2014-06-14 DIAGNOSIS — O0930 Supervision of pregnancy with insufficient antenatal care, unspecified trimester: Secondary | ICD-10-CM | POA: Diagnosis not present

## 2014-06-14 DIAGNOSIS — IMO0001 Reserved for inherently not codable concepts without codable children: Secondary | ICD-10-CM

## 2014-06-14 DIAGNOSIS — Z87891 Personal history of nicotine dependence: Secondary | ICD-10-CM | POA: Diagnosis not present

## 2014-06-14 LAB — TYPE AND SCREEN
ABO/RH(D): B POS
ANTIBODY SCREEN: NEGATIVE

## 2014-06-14 LAB — SAMPLE TO BLOOD BANK

## 2014-06-14 LAB — DIFFERENTIAL
BASOS ABS: 0 10*3/uL (ref 0.0–0.1)
Basophils Relative: 0 % (ref 0–1)
Eosinophils Absolute: 0.1 10*3/uL (ref 0.0–0.7)
Eosinophils Relative: 1 % (ref 0–5)
Lymphocytes Relative: 26 % (ref 12–46)
Lymphs Abs: 2.4 10*3/uL (ref 0.7–4.0)
Monocytes Absolute: 0.7 10*3/uL (ref 0.1–1.0)
Monocytes Relative: 8 % (ref 3–12)
NEUTROS ABS: 6.1 10*3/uL (ref 1.7–7.7)
NEUTROS PCT: 65 % (ref 43–77)

## 2014-06-14 LAB — CBC
HCT: 32.6 % — ABNORMAL LOW (ref 36.0–46.0)
HEMOGLOBIN: 10.9 g/dL — AB (ref 12.0–15.0)
MCH: 29.5 pg (ref 26.0–34.0)
MCHC: 33.4 g/dL (ref 30.0–36.0)
MCV: 88.1 fL (ref 78.0–100.0)
Platelets: 158 10*3/uL (ref 150–400)
RBC: 3.7 MIL/uL — AB (ref 3.87–5.11)
RDW: 13.6 % (ref 11.5–15.5)
WBC: 9.5 10*3/uL (ref 4.0–10.5)

## 2014-06-14 LAB — RAPID HIV SCREEN (WH-MAU): SUDS RAPID HIV SCREEN: NONREACTIVE

## 2014-06-14 LAB — HEPATITIS B SURFACE ANTIGEN: Hepatitis B Surface Ag: NEGATIVE

## 2014-06-14 LAB — RPR

## 2014-06-14 LAB — RUBELLA SCREEN: Rubella: 3.13 Index — ABNORMAL HIGH (ref <0.90)

## 2014-06-14 LAB — ABO/RH: ABO/RH(D): B POS

## 2014-06-14 MED ORDER — CITRIC ACID-SODIUM CITRATE 334-500 MG/5ML PO SOLN: 30.0000 mL | ORAL | Status: DC | PRN

## 2014-06-14 MED ORDER — WITCH HAZEL-GLYCERIN EX PADS: 1.0000 "application " | MEDICATED_PAD | CUTANEOUS | Status: DC | PRN

## 2014-06-14 MED ORDER — EPHEDRINE 5 MG/ML INJ
10.0000 mg | INTRAVENOUS | Status: DC | PRN
  Filled 2014-06-14: qty 2

## 2014-06-14 MED ORDER — LACTATED RINGERS IV SOLN
INTRAVENOUS | Status: DC
  Administered 2014-06-14 (×2): via INTRAVENOUS

## 2014-06-14 MED ORDER — SENNOSIDES-DOCUSATE SODIUM 8.6-50 MG PO TABS
2.0000 | ORAL_TABLET | ORAL | Status: DC
  Administered 2014-06-15: 2 via ORAL
  Filled 2014-06-14 (×2): qty 2

## 2014-06-14 MED ORDER — DIPHENHYDRAMINE HCL 25 MG PO CAPS: 25.0000 mg | ORAL_CAPSULE | Freq: Four times a day (QID) | ORAL | Status: DC | PRN

## 2014-06-14 MED ORDER — BENZOCAINE-MENTHOL 20-0.5 % EX AERO
1.0000 | INHALATION_SPRAY | CUTANEOUS | Status: DC | PRN
Start: 2014-06-14 — End: 2014-06-16
  Administered 2014-06-14: 1 via TOPICAL
  Filled 2014-06-14: qty 56

## 2014-06-14 MED ORDER — OXYCODONE-ACETAMINOPHEN 5-325 MG PO TABS
1.0000 | ORAL_TABLET | ORAL | Status: DC | PRN
  Administered 2014-06-14 – 2014-06-15 (×2): 1 via ORAL
  Filled 2014-06-14 (×2): qty 1

## 2014-06-14 MED ORDER — ONDANSETRON HCL 4 MG/2ML IJ SOLN: 4.0000 mg | INTRAMUSCULAR | Status: DC | PRN

## 2014-06-14 MED ORDER — ACETAMINOPHEN 325 MG PO TABS: 650.0000 mg | ORAL_TABLET | ORAL | Status: DC | PRN

## 2014-06-14 MED ORDER — DIPHENHYDRAMINE HCL 50 MG/ML IJ SOLN
12.5000 mg | INTRAMUSCULAR | Status: DC | PRN
  Administered 2014-06-14: 12.5 mg via INTRAVENOUS
  Filled 2014-06-14: qty 1

## 2014-06-14 MED ORDER — LIDOCAINE HCL (PF) 1 % IJ SOLN
30.0000 mL | INTRAMUSCULAR | Status: DC | PRN
  Filled 2014-06-14: qty 30

## 2014-06-14 MED ORDER — ONDANSETRON HCL 4 MG PO TABS: 4.0000 mg | ORAL_TABLET | ORAL | Status: DC | PRN

## 2014-06-14 MED ORDER — IBUPROFEN 600 MG PO TABS
600.0000 mg | ORAL_TABLET | Freq: Four times a day (QID) | ORAL | Status: DC
  Administered 2014-06-14 – 2014-06-16 (×8): 600 mg via ORAL
  Filled 2014-06-14 (×9): qty 1

## 2014-06-14 MED ORDER — ONDANSETRON HCL 4 MG/2ML IJ SOLN: 4.0000 mg | Freq: Four times a day (QID) | INTRAMUSCULAR | Status: DC | PRN

## 2014-06-14 MED ORDER — DIBUCAINE 1 % RE OINT: 1.0000 "application " | TOPICAL_OINTMENT | RECTAL | Status: DC | PRN

## 2014-06-14 MED ORDER — PRENATAL MULTIVITAMIN CH
1.0000 | ORAL_TABLET | Freq: Every day | ORAL | Status: DC
  Filled 2014-06-14 (×2): qty 1

## 2014-06-14 MED ORDER — LIDOCAINE HCL (PF) 1 % IJ SOLN
INTRAMUSCULAR | Status: DC | PRN
  Administered 2014-06-14 (×2): 4 mL

## 2014-06-14 MED ORDER — LANOLIN HYDROUS EX OINT: TOPICAL_OINTMENT | CUTANEOUS | Status: DC | PRN

## 2014-06-14 MED ORDER — FENTANYL 2.5 MCG/ML BUPIVACAINE 1/10 % EPIDURAL INFUSION (WH - ANES)
14.0000 mL/h | INTRAMUSCULAR | Status: DC | PRN
  Administered 2014-06-14: 14 mL/h via EPIDURAL
  Filled 2014-06-14: qty 125

## 2014-06-14 MED ORDER — FLEET ENEMA 7-19 GM/118ML RE ENEM: 1.0000 | ENEMA | RECTAL | Status: DC | PRN

## 2014-06-14 MED ORDER — OXYTOCIN BOLUS FROM INFUSION
500.0000 mL | INTRAVENOUS | Status: DC
  Administered 2014-06-14: 500 mL via INTRAVENOUS

## 2014-06-14 MED ORDER — FERROUS SULFATE 325 (65 FE) MG PO TABS
325.0000 mg | ORAL_TABLET | Freq: Two times a day (BID) | ORAL | Status: DC
  Administered 2014-06-14 – 2014-06-16 (×4): 325 mg via ORAL
  Filled 2014-06-14 (×4): qty 1

## 2014-06-14 MED ORDER — OXYTOCIN 40 UNITS IN LACTATED RINGERS INFUSION - SIMPLE MED
62.5000 mL/h | INTRAVENOUS | Status: DC
  Filled 2014-06-14: qty 1000

## 2014-06-14 MED ORDER — TETANUS-DIPHTH-ACELL PERTUSSIS 5-2.5-18.5 LF-MCG/0.5 IM SUSP
0.5000 mL | Freq: Once | INTRAMUSCULAR | Status: AC
  Administered 2014-06-14: 0.5 mL via INTRAMUSCULAR
  Filled 2014-06-14: qty 0.5

## 2014-06-14 MED ORDER — PHENYLEPHRINE 40 MCG/ML (10ML) SYRINGE FOR IV PUSH (FOR BLOOD PRESSURE SUPPORT)
80.0000 ug | PREFILLED_SYRINGE | INTRAVENOUS | Status: DC | PRN
Start: 2014-06-14 — End: 2014-06-14
  Filled 2014-06-14: qty 2

## 2014-06-14 MED ORDER — ZOLPIDEM TARTRATE 5 MG PO TABS: 5.0000 mg | ORAL_TABLET | Freq: Every evening | ORAL | Status: DC | PRN

## 2014-06-14 MED ORDER — LACTATED RINGERS IV SOLN
500.0000 mL | Freq: Once | INTRAVENOUS | Status: AC
  Administered 2014-06-14: 500 mL via INTRAVENOUS

## 2014-06-14 MED ORDER — FENTANYL 2.5 MCG/ML BUPIVACAINE 1/10 % EPIDURAL INFUSION (WH - ANES)
INTRAMUSCULAR | Status: DC | PRN
Start: 2014-06-14 — End: 2014-06-16
  Administered 2014-06-14: 14 mL/h via EPIDURAL

## 2014-06-14 MED ORDER — SIMETHICONE 80 MG PO CHEW
80.0000 mg | CHEWABLE_TABLET | ORAL | Status: DC | PRN
  Administered 2014-06-15: 80 mg via ORAL
  Filled 2014-06-14: qty 1

## 2014-06-14 MED ORDER — FENTANYL 2.5 MCG/ML BUPIVACAINE 1/10 % EPIDURAL INFUSION (WH - ANES): 14.0000 mL/h | INTRAMUSCULAR | Status: DC | PRN

## 2014-06-14 MED ORDER — PHENYLEPHRINE 40 MCG/ML (10ML) SYRINGE FOR IV PUSH (FOR BLOOD PRESSURE SUPPORT)
80.0000 ug | PREFILLED_SYRINGE | INTRAVENOUS | Status: DC | PRN
  Filled 2014-06-14: qty 2
  Filled 2014-06-14: qty 10

## 2014-06-14 MED ORDER — LACTATED RINGERS IV SOLN: 500.0000 mL | INTRAVENOUS | Status: DC | PRN

## 2014-06-14 MED ORDER — OXYCODONE-ACETAMINOPHEN 5-325 MG PO TABS: 1.0000 | ORAL_TABLET | ORAL | Status: DC | PRN

## 2014-06-14 MED ORDER — IBUPROFEN 600 MG PO TABS
600.0000 mg | ORAL_TABLET | Freq: Four times a day (QID) | ORAL | Status: DC | PRN
  Administered 2014-06-14: 600 mg via ORAL
  Filled 2014-06-14: qty 1

## 2014-06-14 MED ORDER — BUTORPHANOL TARTRATE 1 MG/ML IJ SOLN
1.0000 mg | INTRAMUSCULAR | Status: DC | PRN
  Administered 2014-06-14: 1 mg via INTRAVENOUS
  Filled 2014-06-14: qty 1

## 2014-06-14 NOTE — Anesthesia Postprocedure Evaluation (Signed)
  Anesthesia Post-op Note  Patient: Lauren Mcclain  Procedure(s) Performed: * No procedures listed *  Patient Location: Mother/Baby  Anesthesia Type:Epidural  Level of Consciousness: awake and alert   Airway and Oxygen Therapy: Patient Spontanous Breathing  Post-op Pain: mild  Post-op Assessment: Post-op Vital signs reviewed, No signs of Nausea or vomiting, No headache, No residual numbness and No residual motor weakness  Post-op Vital Signs: Reviewed  Last Vitals:  Filed Vitals:   06/14/14 1300  BP: 121/77  Pulse: 72  Temp:   Resp: 20    Complications: No apparent anesthesia complications

## 2014-06-14 NOTE — Anesthesia Preprocedure Evaluation (Signed)
Anesthesia Evaluation  Patient identified by MRN, date of birth, ID band Patient awake    Reviewed: Allergy & Precautions, H&P , NPO status , Patient's Chart, lab work & pertinent test results  Airway Mallampati: II TM Distance: >3 FB Neck ROM: full    Dental  (+) Teeth Intact   Pulmonary neg pulmonary ROS, former smoker,  breath sounds clear to auscultation        Cardiovascular negative cardio ROS  Rhythm:regular Rate:Normal     Neuro/Psych negative neurological ROS  negative psych ROS   GI/Hepatic negative GI ROS, Neg liver ROS,   Endo/Other  negative endocrine ROS  Renal/GU negative Renal ROS     Musculoskeletal negative musculoskeletal ROS (+)   Abdominal   Peds  Hematology negative hematology ROS (+)   Anesthesia Other Findings       Reproductive/Obstetrics (+) Pregnancy                           Anesthesia Physical  Anesthesia Plan  ASA: II  Anesthesia Plan: Epidural   Post-op Pain Management:    Induction:   Airway Management Planned:   Additional Equipment:   Intra-op Plan:   Post-operative Plan:   Informed Consent: I have reviewed the patients History and Physical, chart, labs and discussed the procedure including the risks, benefits and alternatives for the proposed anesthesia with the patient or authorized representative who has indicated his/her understanding and acceptance.   Dental Advisory Given  Plan Discussed with:   Anesthesia Plan Comments:         Anesthesia Quick Evaluation

## 2014-06-14 NOTE — Lactation Note (Signed)
This note was copied from the chart of Lauren [facility]. Lactation Consultation Note Initial consultation; baby 7 hours old. Family member present, just gave formula to baby. Mom states she does want to breast feed and formula feed. States that baby has latched well, denies pain.  Mom has a one year old who she breast fed for one day, c/o breast pain with engorgement. Discussed prevention and treatment of engorgement, encouraged frequent STS and cue based breast feeding, and use formula only if necessary especially in the first few weeks. Mom states she does not have any questions or concerns, and does not need help latching baby.  Lactation brochure reviewed with mom; enc mom to call lactation office if she has any concerns after d/c, and to attend the BFS[facility]. Follow up PRN.   Patient Name: Lauren Mcclain [id number] Date: 06/14/2014 Reason for consult: Initial assessment   Maternal Data Formula Feeding for Exclusion: Yes Reason for exclusion: Mother's choice to formula and breast feed on admission  Feeding Feeding Type: Bottle Fed - Formula  LATCH Score/Interventions                      Lactation Tools Discussed/Used     Consult Status Consult Status: PRN    Lenard Forth 06/14/2014, 1:45 PM

## 2014-06-14 NOTE — Anesthesia Procedure Notes (Signed)
Epidural Patient location during procedure: OB  Staffing Anesthesiologist: Prosperity Darrough R Performed by: anesthesiologist   Preanesthetic Checklist Completed: patient identified, pre-op evaluation, timeout performed, IV checked, risks and benefits discussed and monitors and equipment checked  Epidural Patient position: sitting Prep: site prepped and draped and DuraPrep Patient monitoring: heart rate Approach: midline Location: L3-L4 Injection technique: LOR air and LOR saline  Needle:  Needle type: Tuohy  Needle gauge: 17 G Needle length: 9 cm Needle insertion depth: 6 cm Catheter type: closed end flexible Catheter size: 19 Gauge Catheter at skin depth: 12 cm Test dose: negative  Assessment Sensory level: T8 Events: blood not aspirated, injection not painful, no injection resistance, negative IV test and no paresthesia  Additional Notes Reason for block:procedure for pain   

## 2014-06-14 NOTE — H&P (Signed)
This is Dr. Francoise Ceo dictating the history and physical on  Lauren Mcclain  she's a 23 year old gravida 2 para 101 at 80 weeks today   the patient has had very limited prenatal care she's been to the office   once since her initial visit and that was last week on her initial visit she had a positive Chlamydia which was treated however the repeat testing last week the results are not back and her GC status is unknown her GBS from last week is negative she was admitted in labor 5 cm 100% she is now 9 cm 100% vertex station amniotomy performed the fluids clear Past medical history positive Chlamydia Past surgical history negative Social history negative System review negative Physical exam well-developed female in no distress HEENT negative Lungs clear to P&A Heart regular with no murmurs or gallops Breasts negative Abdomen term Pelvic as described above Extremities negative and

## 2014-06-15 LAB — CBC
HEMATOCRIT: 30.3 % — AB (ref 36.0–46.0)
Hemoglobin: 10.2 g/dL — ABNORMAL LOW (ref 12.0–15.0)
MCH: 29.8 pg (ref 26.0–34.0)
MCHC: 33.7 g/dL (ref 30.0–36.0)
MCV: 88.6 fL (ref 78.0–100.0)
Platelets: 134 10*3/uL — ABNORMAL LOW (ref 150–400)
RBC: 3.42 MIL/uL — ABNORMAL LOW (ref 3.87–5.11)
RDW: 13.8 % (ref 11.5–15.5)
WBC: 12.1 10*3/uL — ABNORMAL HIGH (ref 4.0–10.5)

## 2014-06-15 NOTE — Progress Notes (Signed)
Patient ID: Lauren Mcclain, female   DOB: 1991/04/11, 23 y.o.   MRN: [id number] Postpartum day one Vital signs normal Fundus firm Lochia moderate Doing well

## 2014-06-16 ENCOUNTER — Ambulatory Visit: Payer: Self-pay

## 2014-06-16 NOTE — Progress Notes (Signed)
Ur chart review completed.  

## 2014-06-16 NOTE — Lactation Note (Signed)
This note was copied from the chart of Lauren [facility] [place]. Lactation Consultation Note  Patient Name: Lauren Mcclain [id number] Date: 06/16/2014 Reason for consult: Follow-up assessment Per RN, Mom declined LC services before d/c today. She is bottle feeding.   Maternal Data    Feeding    LATCH Score/Interventions                      Lactation Tools Discussed/Used     Consult Status Consult Status: Complete    Alfred Levins 06/16/2014, 12:57 PM

## 2014-06-16 NOTE — Discharge Instructions (Signed)
Discharge instructions   You can wash your hair  Shower  Eat what you want  Drink what you want  See me in 6 weeks  Your ankles are going to swell more in the next 2 weeks than when pregnant  No sex for 6 weeks   Kelsay Haggard A, MD 06/16/2014

## 2014-06-16 NOTE — Progress Notes (Signed)
Patient ID: Lauren Mcclain, female   DOB: 09-03-1991, 23 y.o.   MRN: [id number] Postpartum day 2 Vital signs normal Fundus firm Lochia moderate Legs negative doing well home today

## 2014-06-16 NOTE — Discharge Summary (Signed)
Obstetric Discharge Summary Reason for Admission: onset of labor Prenatal Procedures: none Intrapartum Procedures: spontaneous vaginal delivery Postpartum Procedures: none Complications-Operative and Postpartum: none Hemoglobin  Date Value Ref Range Status  06/15/2014 10.2* 12.0 - 15.0 g/dL Final     HCT  Date Value Ref Range Status  06/15/2014 30.3* 36.0 - 46.0 % Final    Physical Exam:  General: alert Lochia: appropriate Uterine Fundus: firm Incision: healing well DVT Evaluation: No evidence of DVT seen on physical exam.  Discharge Diagnoses: Term Pregnancy-delivered  Discharge Information: Date: 06/16/2014 Activity: pelvic rest Diet: routine Medications: Percocet Condition: stable Instructions: refer to practice specific booklet Discharge to: home Follow-up Information   Follow up with Kathreen Cosier, MD.   Specialty:  Obstetrics and Gynecology   Contact information:   [address] [place] [address] [phone]       Newborn Data: Live born female  Birth Weight: 6 lb 2.9 oz (2805 g) APGAR: 9, 9  Home with mother.  MARSHALL,BERNARD A 06/16/2014, 6:45 AM

## 2014-08-18 ENCOUNTER — Encounter (HOSPITAL_COMMUNITY): Payer: Self-pay

## 2014-09-10 ENCOUNTER — Emergency Department (HOSPITAL_COMMUNITY)
Admission: EM | Admit: 2014-09-10 | Discharge: 2014-09-10 | Disposition: A | Discharge status: Home / Self Care | Payer: Medicaid Other | Source: Home / Self Care | Attending: Emergency Medicine | Admitting: Emergency Medicine

## 2014-09-10 ENCOUNTER — Encounter (HOSPITAL_COMMUNITY): Payer: Self-pay

## 2014-09-10 DIAGNOSIS — G44209 Tension-type headache, unspecified, not intractable: Secondary | ICD-10-CM

## 2014-09-10 NOTE — ED Notes (Addendum)
C/o 2 week duration of HA, comes and goes in different places . NAD. Said she has taken medicaine for her HA, but "stopped since it didn't work"

## 2014-09-10 NOTE — ED Provider Notes (Signed)
CSN: [id number]     Arrival date & time 09/10/14  0957 History   First MD Initiated Contact with Patient 09/10/14 1022     Chief Complaint  Patient presents with  . Headache   (Consider location/radiation/quality/duration/timing/severity/associated sxs/prior Treatment) HPI        23 year old female presents for evaluation of headache. She states that she thinks she probably has a brain tumor or else she has icepick headaches. For 2 weeks she has had intermittent headache multiple times daily. This lasted about 1 minute each time. It is in a different part of her headeach time. It is described as stabbing in nature, mild, 4-5 out of 10 in severity. There are no associated symptoms with this including no nausea, vomiting, dizziness, lightheadedness, photophobia, neck stiffness, visual changes. She also notes that she started using her friend's glasses one month ago, she says that they help her see better, she has never been to an eye doctor. Glasses are broken, they only have one ear piece  Past Medical History  Diagnosis Date  . Trichomonas contact, treated   . Chlamydia    Past Surgical History  Procedure Laterality Date  . No past surgeries     Family History  Problem Relation Age of Onset  . Drug abuse Mother   . Drug abuse Father    History  Substance Use Topics  . Smoking status: Former Smoker -- 0.25 packs/day for 5 years    Types: Cigarettes    Quit date: 05/05/2013  . Smokeless tobacco: Never Used  . Alcohol Use: No     Comment: Not currently   OB History    Gravida Para Term Preterm AB TAB SAB Ectopic Multiple Living   2 2 2       2      Review of Systems  Neurological: Positive for headaches.  All other systems reviewed and are negative.   Allergies  Review of patient's allergies indicates no known allergies.  Home Medications   Prior to Admission medications   Not on File   BP 119/68 mmHg  Pulse 68  Temp(Src) 98.8 F (37.1 C) (Oral)  Resp 16  SpO2  100% Physical Exam  Constitutional: She is oriented to person, place, and time. Vital signs are normal. She appears well-developed and well-nourished. No distress.  HENT:  Head: Normocephalic and atraumatic.  Pulmonary/Chest: Effort normal. No respiratory distress.  Neurological: She is alert and oriented to person, place, and time. She has normal strength and normal reflexes. No cranial nerve deficit or sensory deficit. She exhibits normal muscle tone. She displays a negative Romberg sign. Coordination and gait normal. GCS eye subscore is 4. GCS verbal subscore is 5. GCS motor subscore is 6.  Skin: Skin is warm and dry. No rash noted. She is not diaphoretic.  Psychiatric: She has a normal mood and affect. Judgment normal.  Nursing note and vitals reviewed.   ED Course  Procedures (including critical care time) Labs Review Labs Reviewed - No data to display  Imaging Review No results found.   MDM   1. Tension headache    Detailed neurologic exam is normal. I believe she is having headaches because she is wearing inappropriate glasses. She will stop wearing these and follow-up with an optometrist. At that takes continue she will follow-up with primary care.      Graylon GoodZachary H Rowynn Mcweeney, PA-C 09/10/14 1245

## 2014-09-10 NOTE — Discharge Instructions (Signed)
I recommend you stop using someone else's glasses, I think that is the cause of your headaches. You should follow-up with an optometrist to get your eyes checked.   Tension Headache A tension headache is a feeling of pain, pressure, or aching often felt over the front and sides of the head. The pain can be dull or can feel tight (constricting). It is the most common type of headache. Tension headaches are not normally associated with nausea or vomiting and do not get worse with physical activity. Tension headaches can last 30 minutes to several days.  CAUSES  The exact cause is not known, but it may be caused by chemicals and hormones in the brain that lead to pain. Tension headaches often begin after stress, anxiety, or depression. Other triggers may include:  Alcohol.  Caffeine (too much or withdrawal).  Respiratory infections (colds, flu, sinus infections).  Dental problems or teeth clenching.  Fatigue.  Holding your head and neck in one position too long while using a computer. SYMPTOMS   Pressure around the head.   Dull, aching head pain.   Pain felt over the front and sides of the head.   Tenderness in the muscles of the head, neck, and shoulders. DIAGNOSIS  A tension headache is often diagnosed based on:   Symptoms.   Physical examination.   A CT scan or MRI of your head. These tests may be ordered if symptoms are severe or unusual. TREATMENT  Medicines may be given to help relieve symptoms.  HOME CARE INSTRUCTIONS   Only take over-the-counter or prescription medicines for pain or discomfort as directed by your caregiver.   Lie down in a dark, quiet room when you have a headache.   Keep a journal to find out what may be triggering your headaches. For example, write down:  What you eat and drink.  How much sleep you get.  Any change to your diet or medicines.  Try massage or other relaxation techniques.   Ice packs or heat applied to the head and  neck can be used. Use these 3 to 4 times per day for 15 to 20 minutes each time, or as needed.   Limit stress.   Sit up straight, and do not tense your muscles.   Quit smoking if you smoke.  Limit alcohol use.  Decrease the amount of caffeine you drink, or stop drinking caffeine.  Eat and exercise regularly.  Get 7 to 9 hours of sleep, or as recommended by your caregiver.  Avoid excessive use of pain medicine as recurrent headaches can occur.  SEEK MEDICAL CARE IF:   You have problems with the medicines you were prescribed.  Your medicines do not work.  You have a change from the usual headache.  You have nausea or vomiting. SEEK IMMEDIATE MEDICAL CARE IF:   Your headache becomes severe.  You have a fever.  You have a stiff neck.  You have loss of vision.  You have muscular weakness or loss of muscle control.  You lose your balance or have trouble walking.  You feel faint or pass out.  You have severe symptoms that are different from your first symptoms. MAKE SURE YOU:   Understand these instructions.  Will watch your condition.  Will get help right away if you are not doing well or get worse. Document Released: 10/03/2005 Document Revised: 12/26/2011 Document Reviewed: 09/23/2011 [facility] Patient Information 2015 [employer] [place]. This information is not intended to replace advice given to you by  your health care provider. Make sure you discuss any questions you have with your health care provider.   Go to www.goodrx.com to look up your medications. This will give you a list of where you can find your prescriptions at the most affordable prices.   If you have no primary doctor, here are some resources that may be helpful:  Medicaid-accepting [facility] Providers: - [facility]- [address]  [phone];   - [facility]- [address] [place], [address] [phone]  - [facility]- [address] [phone] [facility]- [address]  [phone]  - Renaye Rakers- [address] [phone]  Only accepts [place] patients       after they have her name applied to their card  -Dr. Jackie Plum, [facility]. [address]    [place], [place] [address]  [phone]  Self Pay (no insurance) in [place]: - Sickle Cell Patients: Dr Willey Blade, [facility] [address] [place] [phone]  - Health [phone]  - Physician Referral Service- [phone]  - [facility]- [address], [place], [phone];  Monday to Friday, 9 a.m. - 7 p.m.; Saturday 9 a.m. to 1 p.m.  [facility]- [address] [place], [place] [phone]  - [facility]- [address]      [phone] - [facility]- [address] [phone]  [facility], [address], [place]; [phone]; or [address] [place]; [phone].   [facility] [place], [place]. [address], [place]; [phone]; Monday to Wednesday, 8:30 a.m. - 5 p.m.; Thursday, 8:30 a.m. - 8 p.m.  [facility], [address], [place]; [phone]; Monday to Friday, 8 a.m. - 4:30 p.m.   [facility], [place] [address] [place], [phone]; first and third Saturday of the month, 9:30 a.m. - 12:30 p.m.  [facility], [address], [place], [phone]; second Saturday of the month, 9 a.m. -noon.  [facility] for children. For information, call [phone]; [phone]; or [phone].  Other agencies that provide inexpensive medical care:     [facility]  [phone]    [facility]  [phone]    [facility]  [phone] [address] [place] [address]    [facility]  [phone]    [facility]  [phone], [phone]; or [phone].  Chronic Pain Problems Contact [facility]  [phone] Patients need to be referred by their primary care doctor.  [facility]  [facility]     [employer]                          [facility] Dept. [address] [place]                       [address]      [address] [place] [address]   [phone] (After Hours)  General Information: Finding a doctor when you do not have health insurance can be tricky. Although you are not limited by an insurance plan, you are of course limited by  her finances and how much but he can pay out of pocket.  What are your options if you don't have health insurance?   1) Find a Librarian, academicDoctor and Pay Out of Pocket Although you won't have to find out who is covered by your insurance plan, it is a good idea to ask around and get recommendations. You will then need to call the office and see if the doctor you have chosen will accept you as a new patient and what types of options they offer for patients who are self-pay. Some doctors offer discounts or will set up payment plans for their patients who do not have insurance, but you will need to ask so you aren't surprised when you get to your appointment.  2) Contact Your Local Health Department Not all health departments have doctors that can see patients for sick visits, but many do, so it is worth a call to see if yours does. If you don't know where your local health department is, you can check in your phone book. The CDC also has a tool to help you locate your state's health department, and many state websites also have listings of all of their local health departments.  3) Find a Walk-in Clinic If your illness is not likely to be very severe or complicated, you may want to try a walk in clinic. These are popping up all over the country in pharmacies, drugstores, and shopping centers. They're usually  staffed by nurse practitioners or physician assistants that have been trained to treat common illnesses and complaints. They're usually fairly quick and inexpensive. However, if you have serious medical issues or chronic medical problems, these are probably not your best option  [facility]: [address] [place] [address]  [phone]) [phone]  [facility]: [address] [place] [address]   [phone](336) [phone]  [facility]: [address] [place] [id number]  [phone]) [phone]  [facility] : [address] [place] [address] [phone](336) [phone]  [facility]: [address] [place] [phone]) [phone]  [facility]: [address] [place] [address] [phone]) [phone]  Dr. Oneal GroutMahima Pandey [address] [facility] [place] [id number]  [phone]) [phone]  Dr. Jackie PlumGeorge Osei-Bonsu, [facility]. [address] [place], [place] [address]  [phone]) [phone]

## 2014-11-25 ENCOUNTER — Inpatient Hospital Stay (HOSPITAL_COMMUNITY)
Admission: AD | Admit: 2014-11-25 | Discharge: 2014-11-25 | Discharge status: Against Medical Advice | Payer: Medicaid Other | Source: Ambulatory Visit | Attending: Family Medicine | Admitting: Family Medicine

## 2014-11-25 ENCOUNTER — Encounter (HOSPITAL_COMMUNITY): Payer: Self-pay | Admitting: *Deleted

## 2014-11-25 DIAGNOSIS — R109 Unspecified abdominal pain: Secondary | ICD-10-CM | POA: Diagnosis present

## 2014-11-25 DIAGNOSIS — Z87891 Personal history of nicotine dependence: Secondary | ICD-10-CM | POA: Diagnosis not present

## 2014-11-25 DIAGNOSIS — A5901 Trichomonal vulvovaginitis: Secondary | ICD-10-CM | POA: Insufficient documentation

## 2014-11-25 LAB — URINALYSIS, ROUTINE W REFLEX MICROSCOPIC
Bilirubin Urine: NEGATIVE
Glucose, UA: NEGATIVE mg/dL
Hgb urine dipstick: NEGATIVE
Ketones, ur: NEGATIVE mg/dL
NITRITE: NEGATIVE
Protein, ur: NEGATIVE mg/dL
UROBILINOGEN UA: 0.2 mg/dL (ref 0.0–1.0)
pH: 5.5 (ref 5.0–8.0)

## 2014-11-25 LAB — URINE MICROSCOPIC-ADD ON

## 2014-11-25 LAB — POCT PREGNANCY, URINE: PREG TEST UR: NEGATIVE

## 2014-11-25 LAB — WET PREP, GENITAL
CLUE CELLS WET PREP: NONE SEEN
Yeast Wet Prep HPF POC: NONE SEEN

## 2014-11-25 MED ORDER — METRONIDAZOLE 500 MG PO TABS
2000.0000 mg | ORAL_TABLET | Freq: Once | ORAL | Status: AC
  Administered 2014-11-25: 2000 mg via ORAL
  Filled 2014-11-25: qty 4

## 2014-11-25 MED ORDER — KETOROLAC TROMETHAMINE 60 MG/2ML IM SOLN
60.0000 mg | Freq: Once | INTRAMUSCULAR | Status: AC
  Administered 2014-11-25: 60 mg via INTRAMUSCULAR
  Filled 2014-11-25: qty 2

## 2014-11-25 NOTE — MAU Note (Signed)
Real bad cramps in lower abd, stabbing pain, started yesterday.  Has had a constant discharge since her delivery in the fall,  Has gotten worse at times, bottom is irritated from it. (does wear carefree and always pads). Also has  Been having headaches, thought it was from her vision. Went to Urgent Care before Thannksgiving.  Has not had her eyes checked.

## 2014-11-25 NOTE — Discharge Instructions (Signed)

## 2014-11-25 NOTE — MAU Provider Note (Signed)
History     CSN: [id number]  Arrival date and time: 11/25/14 1754   First Provider Initiated Contact with Patient 11/25/14 2025      No chief complaint on file.  HPI  Oletta Cohnbony N Swallow is a 24 y.o. [id number] who presents today with cramps and a headache. She states that she has taken advil and tylenol for her headache, but states that it did not help with the headache. She went to urgent care about the headaches in November, and states that everything was normal at that time. She was told to have her eyes checked, but she has not had that done. She states that her LMP was 11/19/14, and was normal. She states that her period lasted until 11/22/14, and the cramping started about 2 days after her period her ended. She states that Dr. Gaynell FaceMarshall delivered her baby in the fall. She has not called his office about the cramps.   Past Medical History  Diagnosis Date  . Trichomonas contact, treated   . Chlamydia     Past Surgical History  Procedure Laterality Date  . No past surgeries      Family History  Problem Relation Age of Onset  . Drug abuse Mother   . Drug abuse Father     History  Substance Use Topics  . Smoking status: Former Smoker -- 0.25 packs/day for 5 years    Types: Cigarettes    Quit date: 05/05/2013  . Smokeless tobacco: Never Used  . Alcohol Use: No     Comment: Not currently    Allergies: No Known Allergies  Prescriptions prior to admission  Medication Sig Dispense Refill Last Dose  . acetaminophen (TYLENOL) 500 MG tablet Take 1,000 mg by mouth every 6 (six) hours as needed for mild pain or headache.   Past Week at Unknown time  . ibuprofen (ADVIL,MOTRIN) 200 MG tablet Take 400 mg by mouth every 6 (six) hours as needed for headache or mild pain.   11/24/2014 at Unknown time    ROS Physical Exam   Blood pressure 108/70, pulse 116, temperature 98.7 F (37.1 C), temperature source Oral, resp. rate 18, height 5\' 9"  (1.753 m), weight 68.493 kg (151 lb), last menstrual  period 11/19/2014, SpO2 98 %, unknown if currently breastfeeding.  Physical Exam  Nursing note and vitals reviewed. Constitutional: She is oriented to person, place, and time. She appears well-developed and well-nourished. No distress.  Cardiovascular: Normal rate.   Respiratory: Effort normal.  GI: Soft. There is no tenderness. There is no rebound.  Neurological: She is alert and oriented to person, place, and time.  Skin: Skin is warm and dry.  Psychiatric: She has a normal mood and affect.    MAU Course  Procedures  Results for orders placed or performed during the hospital encounter of 11/25/14 (from the past 24 hour(s))  Urinalysis, Routine w reflex microscopic     Status: Abnormal   Collection Time: 11/25/14  6:12 PM  Result Value Ref Range   Color, Urine YELLOW YELLOW   APPearance CLEAR CLEAR   Specific Gravity, Urine >1.030 (H) 1.005 - 1.030   pH 5.5 5.0 - 8.0   Glucose, UA NEGATIVE NEGATIVE mg/dL   Hgb urine dipstick NEGATIVE NEGATIVE   Bilirubin Urine NEGATIVE NEGATIVE   Ketones, ur NEGATIVE NEGATIVE mg/dL   Protein, ur NEGATIVE NEGATIVE mg/dL   Urobilinogen, UA 0.2 0.0 - 1.0 mg/dL   Nitrite NEGATIVE NEGATIVE   Leukocytes, UA MODERATE (A) NEGATIVE  Urine microscopic-add on  Status: Abnormal   Collection Time: 11/25/14  6:12 PM  Result Value Ref Range   Squamous Epithelial / LPF FEW (A) RARE   WBC, UA 0-2 <3 WBC/hpf   RBC / HPF 3-6 <3 RBC/hpf   Urine-Other TRICHOMONAS PRESENT   Pregnancy, urine POC     Status: None   Collection Time: 11/25/14  6:20 PM  Result Value Ref Range   Preg Test, Ur NEGATIVE NEGATIVE  Wet prep, genital     Status: Abnormal   Collection Time: 11/25/14  8:30 PM  Result Value Ref Range   Yeast Wet Prep HPF POC NONE SEEN NONE SEEN   Trich, Wet Prep MODERATE (A) NONE SEEN   Clue Cells Wet Prep HPF POC NONE SEEN NONE SEEN   WBC, Wet Prep HPF POC MODERATE (A) NONE SEEN     Assessment and Plan   1. Trichomonal vaginitis    Treated  here in MAU with flagyl GC/CT, HIV pending Return to MAU as needed  Follow-up Information    Follow up with Kathreen Cosier, MD.   Specialty:  Obstetrics and Gynecology   Why:  As needed   Contact information:   [address] [place] [address] [phone]        Tawnya Crook 11/25/2014, 8:27 PM

## 2014-11-26 LAB — GC/CHLAMYDIA PROBE AMP (~~LOC~~) NOT AT ARMC
Chlamydia: NEGATIVE
NEISSERIA GONORRHEA: NEGATIVE

## 2014-11-27 LAB — HIV ANTIBODY (ROUTINE TESTING W REFLEX): HIV SCREEN 4TH GENERATION: NONREACTIVE

## 2015-12-02 ENCOUNTER — Inpatient Hospital Stay (HOSPITAL_COMMUNITY)
Admission: AD | Admit: 2015-12-02 | Discharge: 2015-12-02 | Disposition: A | Discharge status: Home / Self Care | Payer: Medicaid Other | Source: Ambulatory Visit | Attending: Obstetrics & Gynecology | Admitting: Obstetrics & Gynecology

## 2015-12-02 ENCOUNTER — Encounter (HOSPITAL_COMMUNITY): Payer: Self-pay | Admitting: *Deleted

## 2015-12-02 DIAGNOSIS — R109 Unspecified abdominal pain: Secondary | ICD-10-CM | POA: Insufficient documentation

## 2015-12-02 DIAGNOSIS — Z113 Encounter for screening for infections with a predominantly sexual mode of transmission: Secondary | ICD-10-CM | POA: Insufficient documentation

## 2015-12-02 DIAGNOSIS — N921 Excessive and frequent menstruation with irregular cycle: Secondary | ICD-10-CM | POA: Diagnosis not present

## 2015-12-02 DIAGNOSIS — Z87891 Personal history of nicotine dependence: Secondary | ICD-10-CM | POA: Diagnosis not present

## 2015-12-02 DIAGNOSIS — N939 Abnormal uterine and vaginal bleeding, unspecified: Secondary | ICD-10-CM

## 2015-12-02 LAB — CBC
HEMATOCRIT: 36.7 % (ref 36.0–46.0)
HEMOGLOBIN: 12.4 g/dL (ref 12.0–15.0)
MCH: 30.2 pg (ref 26.0–34.0)
MCHC: 33.8 g/dL (ref 30.0–36.0)
MCV: 89.3 fL (ref 78.0–100.0)
Platelets: 167 10*3/uL (ref 150–400)
RBC: 4.11 MIL/uL (ref 3.87–5.11)
RDW: 12.9 % (ref 11.5–15.5)
WBC: 6.6 10*3/uL (ref 4.0–10.5)

## 2015-12-02 LAB — URINALYSIS, ROUTINE W REFLEX MICROSCOPIC
Bilirubin Urine: NEGATIVE
Glucose, UA: NEGATIVE mg/dL
HGB URINE DIPSTICK: NEGATIVE
KETONES UR: NEGATIVE mg/dL
Leukocytes, UA: NEGATIVE
Nitrite: NEGATIVE
PROTEIN: NEGATIVE mg/dL
Specific Gravity, Urine: 1.02 (ref 1.005–1.030)
pH: 6 (ref 5.0–8.0)

## 2015-12-02 LAB — POCT PREGNANCY, URINE: PREG TEST UR: NEGATIVE

## 2015-12-02 LAB — WET PREP, GENITAL
Sperm: NONE SEEN
Trich, Wet Prep: NONE SEEN
Yeast Wet Prep HPF POC: NONE SEEN

## 2015-12-02 NOTE — MAU Provider Note (Signed)
History     CSN: [id number]  Arrival date and time: 12/02/15 1044   First Provider Initiated Contact with Patient 12/02/15 1136      Chief Complaint  Patient presents with  . Metrorrhagia  . Abdominal Cramping   HPI Comments: Lauren Mcclain is a 25yo AA female with no significant pmh here today for vaginal bleeding and STD check . Patient states that she had a normal period last month and started having heavy vaginal bleeding 2 weeks after along with severe cramps. She was concerned that she was having a miscarriage. She took 3 home pregnancy test which were negative. She states that her periods have always been regular. She states the cramping was intermittent and did not radiate. She rated the pain a 9/10 that waxes and wanes. Nothing makes better or worse. She currently denies vaginal bleeding but endorse mild lower abdominal cramping. She denies any fever,chills, N/V/D, change in bowel habits, urinary symptoms, vaginal discharge, or vaginal itching. Patient is sexually active with multiple partners and does not use protection. She does not take birth control. She is interested in OCP. Requesting STD screening.  Abdominal Cramping Pertinent negatives include no constipation, diarrhea, fever, headaches, nausea or vomiting.   OB History    Gravida Para Term Preterm AB TAB SAB Ectopic Multiple Living   Past Medical History  Diagnosis Date  . Trichomonas contact, treated   . Chlamydia     Past Surgical History  Procedure Laterality Date  . No past surgeries      Family History  Problem Relation Age of Onset  . Drug abuse Mother   . Drug abuse Father     Social History  Substance Use Topics  . Smoking status: Former Smoker -- 0.25 packs/day for 5 years    Types: Cigarettes    Quit date: 05/05/2013  . Smokeless tobacco: Never Used  . Alcohol Use: No     Comment: Not currently    Allergies: No Known Allergies  Prescriptions prior to admission   Medication Sig Dispense Refill Last Dose  . acetaminophen (TYLENOL) 500 MG tablet Take 1,000 mg by mouth every 6 (six) hours as needed for mild pain or headache.   Past Month at Unknown time  . ibuprofen (ADVIL,MOTRIN) 200 MG tablet Take 400 mg by mouth every 6 (six) hours as needed for headache or mild pain.   Past Month at Unknown time    Review of Systems  Constitutional: Negative for fever and chills.  Respiratory: Negative for shortness of breath.   Cardiovascular: Negative for chest pain and palpitations.  Gastrointestinal: Positive for abdominal pain (Lower abdominal cramping). Negative for nausea, vomiting, diarrhea and constipation.  Genitourinary: Negative.   Neurological: Negative for dizziness and headaches.   Physical Exam   Blood pressure 118/72, pulse 78, temperature 98.1 F (36.7 C), temperature source Oral, resp. rate 16, weight 65.772 kg (145 lb), last menstrual period 11/28/2015, unknown if currently breastfeeding.  Physical Exam  Constitutional: She is oriented to person, place, and time. She appears well-developed and well-nourished. No distress.  HENT:  Head: Normocephalic and atraumatic.  Eyes: Conjunctivae and EOM are normal.  Neck: Normal range of motion. Neck supple.  Cardiovascular: Normal rate, regular rhythm and normal heart sounds.   Respiratory: Effort normal and breath sounds normal.  GI: Soft. Bowel sounds are normal. She exhibits no distension. There is no tenderness. There is no rebound and  no guarding.  Genitourinary: Cervix exhibits no motion tenderness and no discharge. Right adnexum displays no mass and no tenderness. Left adnexum displays no mass and no tenderness. No erythema, tenderness or bleeding in the vagina. Vaginal discharge (Moderate amount of thin white mucus discharge in vaginal vault, no odor appreciated) found.  Musculoskeletal: Normal range of motion.  Neurological: She is alert and oriented to person, place, and time.  Skin: Skin  is warm and dry.  Psychiatric: She has a normal mood and affect. Her behavior is normal.    MAU Course  Procedures  MDM UPT neg UA wnl CBC due to heavy vaginal bleeding Pelvic exam preformed with moderate amount of thin white discharge appreciated  GC/chlym Wet prep RPR HIV No emergent infection  Assessment and Plan  A: STD screening, Abnormal uterine bleeding  P: Pt left prior to discharge.  No emergent issues have arisen that require attention   Patient may return to MAU as needed or if her condition were to change or worsen   Demetrios Loll 12/02/2015, 12:04 PM

## 2015-12-02 NOTE — MAU Note (Signed)
Had regular period in mid Jan, end of the month had another period and then again in Feb.  With the last 2 period she has had really bad cramps.  Thought it might be a miscarriage- 2 neg HPT

## 2015-12-03 LAB — RPR: RPR: NONREACTIVE

## 2015-12-03 LAB — GC/CHLAMYDIA PROBE AMP (~~LOC~~) NOT AT ARMC
Chlamydia: POSITIVE — AB
NEISSERIA GONORRHEA: NEGATIVE

## 2015-12-03 LAB — HIV ANTIBODY (ROUTINE TESTING W REFLEX): HIV SCREEN 4TH GENERATION: NONREACTIVE

## 2015-12-07 ENCOUNTER — Telehealth (HOSPITAL_COMMUNITY): Payer: Self-pay | Admitting: *Deleted

## 2015-12-07 DIAGNOSIS — A749 Chlamydial infection, unspecified: Secondary | ICD-10-CM

## 2015-12-07 MED ORDER — AZITHROMYCIN 500 MG PO TABS: ORAL_TABLET | ORAL | Status: DC

## 2015-12-07 NOTE — Telephone Encounter (Signed)
Telephone call to patient regarding positive chlamydia culture, patient notified.  Rx routed to patients pharmacy per protocol.  Instructed patient to notify her partner for treatment and to abstain from sex for seven days post treatment.  Report faxed to health department.  

## 2016-08-22 ENCOUNTER — Inpatient Hospital Stay (HOSPITAL_COMMUNITY): Payer: Medicaid Other

## 2016-08-22 ENCOUNTER — Inpatient Hospital Stay (HOSPITAL_COMMUNITY)
Admission: AD | Admit: 2016-08-22 | Discharge: 2016-08-22 | Disposition: A | Discharge status: Home / Self Care | Payer: Medicaid Other | Source: Ambulatory Visit | Attending: Family Medicine | Admitting: Family Medicine

## 2016-08-22 ENCOUNTER — Encounter (HOSPITAL_COMMUNITY): Payer: Self-pay | Admitting: *Deleted

## 2016-08-22 DIAGNOSIS — O26891 Other specified pregnancy related conditions, first trimester: Secondary | ICD-10-CM | POA: Diagnosis not present

## 2016-08-22 DIAGNOSIS — O99331 Smoking (tobacco) complicating pregnancy, first trimester: Secondary | ICD-10-CM | POA: Insufficient documentation

## 2016-08-22 DIAGNOSIS — N760 Acute vaginitis: Secondary | ICD-10-CM | POA: Insufficient documentation

## 2016-08-22 DIAGNOSIS — O26892 Other specified pregnancy related conditions, second trimester: Secondary | ICD-10-CM | POA: Diagnosis present

## 2016-08-22 DIAGNOSIS — F17290 Nicotine dependence, other tobacco product, uncomplicated: Secondary | ICD-10-CM | POA: Diagnosis not present

## 2016-08-22 DIAGNOSIS — R109 Unspecified abdominal pain: Secondary | ICD-10-CM | POA: Insufficient documentation

## 2016-08-22 DIAGNOSIS — O26899 Other specified pregnancy related conditions, unspecified trimester: Secondary | ICD-10-CM

## 2016-08-22 DIAGNOSIS — O3680X0 Pregnancy with inconclusive fetal viability, not applicable or unspecified: Secondary | ICD-10-CM

## 2016-08-22 DIAGNOSIS — Z3A01 Less than 8 weeks gestation of pregnancy: Secondary | ICD-10-CM | POA: Diagnosis not present

## 2016-08-22 DIAGNOSIS — B9689 Other specified bacterial agents as the cause of diseases classified elsewhere: Secondary | ICD-10-CM

## 2016-08-22 LAB — CBC
HEMATOCRIT: 38.6 % (ref 36.0–46.0)
HEMOGLOBIN: 13.2 g/dL (ref 12.0–15.0)
MCH: 30.5 pg (ref 26.0–34.0)
MCHC: 34.2 g/dL (ref 30.0–36.0)
MCV: 89.1 fL (ref 78.0–100.0)
Platelets: 174 10*3/uL (ref 150–400)
RBC: 4.33 MIL/uL (ref 3.87–5.11)
RDW: 13.1 % (ref 11.5–15.5)
WBC: 4.7 10*3/uL (ref 4.0–10.5)

## 2016-08-22 LAB — URINALYSIS, ROUTINE W REFLEX MICROSCOPIC
Bilirubin Urine: NEGATIVE
GLUCOSE, UA: NEGATIVE mg/dL
Hgb urine dipstick: NEGATIVE
Ketones, ur: NEGATIVE mg/dL
LEUKOCYTES UA: NEGATIVE
Nitrite: NEGATIVE
PH: 6.5 (ref 5.0–8.0)
Protein, ur: NEGATIVE mg/dL
SPECIFIC GRAVITY, URINE: 1.01 (ref 1.005–1.030)

## 2016-08-22 LAB — WET PREP, GENITAL
SPERM: NONE SEEN
TRICH WET PREP: NONE SEEN
Yeast Wet Prep HPF POC: NONE SEEN

## 2016-08-22 LAB — HCG, QUANTITATIVE, PREGNANCY: hCG, Beta Chain, Quant, S: 1658 m[IU]/mL — ABNORMAL HIGH (ref <5)

## 2016-08-22 LAB — ABO/RH: ABO/RH(D): B POS

## 2016-08-22 LAB — POCT PREGNANCY, URINE: Preg Test, Ur: POSITIVE — AB

## 2016-08-22 MED ORDER — METRONIDAZOLE 500 MG PO TABS: 500.0000 mg | ORAL_TABLET | Freq: Two times a day (BID) | ORAL | 0 refills | Status: DC

## 2016-08-22 NOTE — MAU Note (Signed)
Cramps, persistent.  Can't remember last period.  Pain started 4 days ago.  +HPT last wk.  Feels like period is going to start

## 2016-08-22 NOTE — MAU Provider Note (Signed)
History     CSN: [id number]  Arrival date and time: 08/22/16 [id number]   First Provider Initiated Contact with Patient 08/22/16 1047      Chief Complaint  Patient presents with  . Abdominal Cramping   HPI   [name] is a 25 y.o. female 653P2002 @ Unknown here in MAU with abdominal pain. The patient had some idea that she was pregnant, however does not recall her exact LMP due to irregular menstrual cycles. The pain is located in her lower abdomen, the pain is sporadic, and comes and goes. The pain occurs every day.  She has not taken anything for the pain.   OB History    Gravida Para Term Preterm AB Living   3 2 2     2    SAB TAB Ectopic Multiple Live Births           2      Past Medical History:  Diagnosis Date  . Chlamydia   . Trichomonas contact, treated     Past Surgical History:  Procedure Laterality Date  . NO PAST SURGERIES      Family History  Problem Relation Age of Onset  . Drug abuse Mother   . Drug abuse Father     Social History  Substance Use Topics  . Smoking status: Current Every Day Smoker    Years: 7.00    Types: Cigars    Last attempt to quit: 05/05/2013  . Smokeless tobacco: Never Used     Comment: black and mild; 1/d  . Alcohol use No     Comment: Not currently    Allergies: No Known Allergies  No prescriptions prior to admission.   Results for orders placed or performed during the hospital encounter of 08/22/16 (from the past 48 hour(s))  Urinalysis, Routine w reflex microscopic (not at [facility])     Status: None   Collection Time: 08/22/16 10:05 AM  Result Value Ref Range   Color, Urine YELLOW YELLOW   APPearance CLEAR CLEAR   Specific Gravity, Urine 1.010 1.005 - 1.030   pH 6.5 5.0 - 8.0   Glucose, UA NEGATIVE NEGATIVE mg/dL   Hgb urine dipstick NEGATIVE NEGATIVE   Bilirubin Urine NEGATIVE NEGATIVE   Ketones, ur NEGATIVE NEGATIVE mg/dL   Protein, ur NEGATIVE NEGATIVE mg/dL   Nitrite NEGATIVE NEGATIVE   Leukocytes, UA  NEGATIVE NEGATIVE    Comment: MICROSCOPIC NOT DONE ON URINES WITH NEGATIVE PROTEIN, BLOOD, LEUKOCYTES, NITRITE, OR GLUCOSE <1000 mg/dL.  Pregnancy, urine POC     Status: Abnormal   Collection Time: 08/22/16 10:10 AM  Result Value Ref Range   Preg Test, Ur POSITIVE (A) NEGATIVE    Comment:        THE SENSITIVITY OF THIS METHODOLOGY IS >24 mIU/mL   CBC     Status: None   Collection Time: 08/22/16 11:17 AM  Result Value Ref Range   WBC 4.7 4.0 - 10.5 K/uL   RBC 4.33 3.87 - 5.11 MIL/uL   Hemoglobin 13.2 12.0 - 15.0 g/dL   HCT 29.538.6 28.436.0 - 13.246.0 %   MCV 89.1 78.0 - 100.0 fL   MCH 30.5 26.0 - 34.0 pg   MCHC 34.2 30.0 - 36.0 g/dL   RDW 44.013.1 10.211.5 - 72.515.5 %   Platelets 174 150 - 400 K/uL  ABO/Rh     Status: None   Collection Time: 08/22/16 11:17 AM  Result Value Ref Range   ABO/RH(D) B POS   hCG, quantitative, pregnancy  Status: Abnormal   Collection Time: 08/22/16 11:17 AM  Result Value Ref Range   hCG, Beta Chain, Quant, S 1,658 (H) <5 mIU/mL    Comment:          GEST. AGE      CONC.  (mIU/mL)   <=1 WEEK        5 - 50     2 WEEKS       50 - 500     3 WEEKS       100 - 10,000     4 WEEKS     1,000 - 30,000     5 WEEKS     3,500 - 115,000   6-8 WEEKS     12,000 - 270,000    12 WEEKS     15,000 - 220,000        FEMALE AND NON-PREGNANT FEMALE:     LESS THAN 5 mIU/mL   Wet prep, genital     Status: Abnormal   Collection Time: 08/22/16 11:23 AM  Result Value Ref Range   Yeast Wet Prep HPF POC NONE SEEN NONE SEEN   Trich, Wet Prep NONE SEEN NONE SEEN   Clue Cells Wet Prep HPF POC PRESENT (A) NONE SEEN   WBC, Wet Prep HPF POC FEW (A) NONE SEEN    Comment: FEW BACTERIA SEEN   Sperm NONE SEEN    [place] Ob Comp Less 14 Wks  Result Date: 08/22/2016 CLINICAL DATA:  Pregnant, abdominal pain, beta HCG 1658 EXAM: OBSTETRIC <14 WK US AND TRANSVAGINAL OB US TECHNIQUE: Both transabdominal and transvaginal ultrasound examinations were performed for complete evaluation of the gestation as well  as the maternal uterus, adnexal regions, and pelvic cul-de-sac. Transvaginal technique was performed to assess early pregnancy. COMPARISON:  None. FINDINGS: Intrauterine gestational sac: Single Yolk sac:  Not Visualized. Embryo:  Not Visualized. MSD: 3.5  mm   5 w   0  d Subchorionic hemorrhage:  None visualized. Maternal uterus/adnexae: Bilateral ovaries are within normal limits, noting a left corpus luteal cyst. Small volume pelvic ascites, within normal limits. IMPRESSION: Single intrauterine gestational sac, measuring 5 weeks 0 days by mean sac diameter. No yolk sac or fetal pole is visualized. Consider follow-up pelvic ultrasound in 14 days to confirm viability, as clinically warranted. Electronically Signed   By: Charline BillsSriyesh  Krishnan M.D.   On: 08/22/2016 13:15   [place] Ob Transvaginal  Result Date: 08/22/2016 CLINICAL DATA:  Pregnant, abdominal pain, beta HCG 1658 EXAM: OBSTETRIC <14 WK US AND TRANSVAGINAL OB US TECHNIQUE: Both transabdominal and transvaginal ultrasound examinations were performed for complete evaluation of the gestation as well as the maternal uterus, adnexal regions, and pelvic cul-de-sac. Transvaginal technique was performed to assess early pregnancy. COMPARISON:  None. FINDINGS: Intrauterine gestational sac: Single Yolk sac:  Not Visualized. Embryo:  Not Visualized. MSD: 3.5  mm   5 w   0  d Subchorionic hemorrhage:  None visualized. Maternal uterus/adnexae: Bilateral ovaries are within normal limits, noting a left corpus luteal cyst. Small volume pelvic ascites, within normal limits. IMPRESSION: Single intrauterine gestational sac, measuring 5 weeks 0 days by mean sac diameter. No yolk sac or fetal pole is visualized. Consider follow-up pelvic ultrasound in 14 days to confirm viability, as clinically warranted. Electronically Signed   By: Charline BillsSriyesh  Krishnan M.D.   On: 08/22/2016 13:15    Review of Systems  Constitutional: Negative for chills and fever.  Gastrointestinal: Positive for  abdominal pain. Negative for nausea and vomiting.  Genitourinary: Negative for dysuria, frequency and urgency.   Physical Exam   Blood pressure 116/73, pulse 90, temperature 98.8 F (37.1 C), temperature source Oral, resp. rate 16, height 5\' 8"  (1.727 m), weight 131 lb 3.2 oz (59.5 kg), unknown if currently breastfeeding.  Physical Exam  Constitutional: She is oriented to person, place, and time. She appears well-developed and well-nourished. No distress.  HENT:  Head: Normocephalic.  Eyes: Pupils are equal, round, and reactive to light.  Genitourinary:  Genitourinary Comments: Vagina - Small amount of white vaginal discharge, no odor  Cervix - No contact bleeding, no active bleeding  Bimanual exam: Cervix closed Uterus non tender, normal size Adnexa non tender, no masses bilaterally GC/Chlam, wet prep done Chaperone present for exam.   Musculoskeletal: Normal range of motion.  Neurological: She is alert and oriented to person, place, and time.  Skin: Skin is warm. She is not diaphoretic.  Psychiatric: Her behavior is normal.    MAU Course  Procedures  None  MDM  CBC Hcg Wet prep  GC [place]   Assessment and Plan   A:  1. Pregnancy of unknown anatomic location   2. Abdominal pain in pregnancy, antepartum   3. BV (bacterial vaginosis)     P:  Discharge home in stable condition Ectopic precautions Rx: Flagyl  Go to the clinic @ 1100 on 11/8 for repeat beta hcg level.  Return to MAU if symptoms worsen   Duane Lope, NP 08/22/2016 6:51 PM

## 2016-08-22 NOTE — MAU Provider Note (Signed)
History    Chief Complaint  Patient presents with  . Abdominal Cramping   Ms. Lauren Mcclain is a 25 y.o. G3P2 female with a recent positive home pregnancy test who presents with lower abdominal cramping.  Patient is unsure of when her LMP was but thinks it was sometime in September.  Over the past 4 days she has experienced intermittent, sporadic, lower abdominal cramping.  She claims the pain occurs several times per day and usually lasts for a few minutes.  She describes her pain as a mild discomfort and rates it 2/10 severity.  She denies vaginal bleeding and vaginal discharge.  No dysuria, frequency, or urgency.  No fever, chills, nausea, or vomiting.  She has noticed looser stools over the past week with an increased overall stool frequency.  No other complaints.        OB History    Gravida Para Term Preterm AB Living   3 2 2     2    SAB TAB Ectopic Multiple Live Births           2      Past Medical History:  Diagnosis Date  . Chlamydia   . Trichomonas contact, treated     Past Surgical History:  Procedure Laterality Date  . NO PAST SURGERIES      Family History  Problem Relation Age of Onset  . Drug abuse Mother   . Drug abuse Father     Social History  Substance Use Topics  . Smoking status: Current Every Day Smoker    Years: 7.00    Types: Cigars    Last attempt to quit: 05/05/2013  . Smokeless tobacco: Never Used     Comment: black and mild; 1/d  . Alcohol use No     Comment: Not currently    Allergies: No Known Allergies  No prescriptions prior to admission.    Review of Systems  Constitutional: Negative for chills and fever.  HENT: Negative.   Respiratory: Negative for cough and shortness of breath.   Cardiovascular: Negative.   Gastrointestinal: Positive for abdominal pain. Negative for constipation, diarrhea, nausea and vomiting.  Genitourinary: Negative for dysuria, frequency and urgency.  Musculoskeletal: Negative.   Skin: Negative.    Neurological: Negative.   Psychiatric/Behavioral: Negative.    Physical Exam Blood pressure 116/73, pulse 90, temperature 98.8 F (37.1 C), temperature source Oral, resp. rate 16, height 5\' 8"  (1.727 m), weight 59.5 kg (131 lb 3.2 oz), unknown if currently breastfeeding. Physical Exam  Constitutional: She is oriented to person, place, and time. She appears well-developed and well-nourished.  HENT:  Head: Normocephalic and atraumatic.  Neck: Normal range of motion. Neck supple.  Respiratory: Effort normal. No respiratory distress.  GI: Soft. She exhibits no distension. There is no tenderness. There is no rebound and no guarding.  Genitourinary: Cervix exhibits no motion tenderness, no discharge and no friability. Right adnexum displays no tenderness. Left adnexum displays no tenderness. No bleeding in the vagina. No vaginal discharge found.  Musculoskeletal: Normal range of motion.  Neurological: She is alert and oriented to person, place, and time. She has normal reflexes.   MAU Course Procedures  MDM CBC UPT UA B-hcg Transvaginal US ABO type Wet prep GC/chlamydia: pending HIV  Results for orders placed or performed during the hospital encounter of 08/22/16 (from the past 24 hour(s))  Urinalysis, Routine w reflex microscopic (not at [facility])     Status: None   Collection Time: 08/22/16 10:05 AM  Result Value Ref Range   Color, Urine YELLOW YELLOW   APPearance CLEAR CLEAR   Specific Gravity, Urine 1.010 1.005 - 1.030   pH 6.5 5.0 - 8.0   Glucose, UA NEGATIVE NEGATIVE mg/dL   Hgb urine dipstick NEGATIVE NEGATIVE   Bilirubin Urine NEGATIVE NEGATIVE   Ketones, ur NEGATIVE NEGATIVE mg/dL   Protein, ur NEGATIVE NEGATIVE mg/dL   Nitrite NEGATIVE NEGATIVE   Leukocytes, UA NEGATIVE NEGATIVE  Pregnancy, urine POC     Status: Abnormal   Collection Time: 08/22/16 10:10 AM  Result Value Ref Range   Preg Test, Ur POSITIVE (A) NEGATIVE  CBC     Status: None   Collection Time:  08/22/16 11:17 AM  Result Value Ref Range   WBC 4.7 4.0 - 10.5 K/uL   RBC 4.33 3.87 - 5.11 MIL/uL   Hemoglobin 13.2 12.0 - 15.0 g/dL   HCT 29.538.6 62.136.0 - 30.846.0 %   MCV 89.1 78.0 - 100.0 fL   MCH 30.5 26.0 - 34.0 pg   MCHC 34.2 30.0 - 36.0 g/dL   RDW 65.713.1 84.611.5 - 96.215.5 %   Platelets 174 150 - 400 K/uL  ABO/Rh     Status: None   Collection Time: 08/22/16 11:17 AM  Result Value Ref Range   ABO/RH(D) B POS   hCG, quantitative, pregnancy     Status: Abnormal   Collection Time: 08/22/16 11:17 AM  Result Value Ref Range   hCG, Beta Chain, Quant, S 1,658 (H) <5 mIU/mL  Wet prep, genital     Status: Abnormal   Collection Time: 08/22/16 11:23 AM  Result Value Ref Range   Yeast Wet Prep HPF POC NONE SEEN NONE SEEN   Trich, Wet Prep NONE SEEN NONE SEEN   Clue Cells Wet Prep HPF POC PRESENT (A) NONE SEEN   WBC, Wet Prep HPF POC FEW (A) NONE SEEN   Sperm NONE SEEN     Assessment Lauren Mcclain is a 25 y.o. G3P2 female with a recent positive home pregnancy test who presents with lower abdominal cramping.  Her pain/cramping is likely related to her pregnancy.   US showed a single intrauterine gestational sac measuring 5 weeks and 0 days.    Plan 1st trimester pregnancy precautions Advised to seek prenatal care Start prenatal vitamins

## 2016-08-22 NOTE — Discharge Instructions (Signed)

## 2016-08-23 LAB — GC/CHLAMYDIA PROBE AMP (~~LOC~~) NOT AT ARMC
CHLAMYDIA, DNA PROBE: NEGATIVE
NEISSERIA GONORRHEA: NEGATIVE

## 2016-08-23 LAB — HIV ANTIBODY (ROUTINE TESTING W REFLEX): HIV SCREEN 4TH GENERATION: NONREACTIVE

## 2016-08-24 ENCOUNTER — Telehealth: Payer: Self-pay | Admitting: General Practice

## 2016-08-24 ENCOUNTER — Ambulatory Visit: Payer: Medicaid Other

## 2016-08-24 ENCOUNTER — Encounter (HOSPITAL_COMMUNITY): Payer: Self-pay

## 2016-08-24 ENCOUNTER — Inpatient Hospital Stay (HOSPITAL_COMMUNITY)
Admission: AD | Admit: 2016-08-24 | Discharge: 2016-08-24 | Disposition: A | Discharge status: Home / Self Care | Payer: Medicaid Other | Source: Ambulatory Visit | Attending: Obstetrics & Gynecology | Admitting: Obstetrics & Gynecology

## 2016-08-24 ENCOUNTER — Encounter: Payer: Self-pay | Admitting: General Practice

## 2016-08-24 ENCOUNTER — Inpatient Hospital Stay (HOSPITAL_COMMUNITY): Payer: Medicaid Other

## 2016-08-24 DIAGNOSIS — Z7982 Long term (current) use of aspirin: Secondary | ICD-10-CM | POA: Diagnosis not present

## 2016-08-24 DIAGNOSIS — O23591 Infection of other part of genital tract in pregnancy, first trimester: Secondary | ICD-10-CM | POA: Insufficient documentation

## 2016-08-24 DIAGNOSIS — Z87898 Personal history of other specified conditions: Secondary | ICD-10-CM | POA: Insufficient documentation

## 2016-08-24 DIAGNOSIS — Z3491 Encounter for supervision of normal pregnancy, unspecified, first trimester: Secondary | ICD-10-CM

## 2016-08-24 DIAGNOSIS — Z79899 Other long term (current) drug therapy: Secondary | ICD-10-CM | POA: Insufficient documentation

## 2016-08-24 DIAGNOSIS — O99331 Smoking (tobacco) complicating pregnancy, first trimester: Secondary | ICD-10-CM | POA: Diagnosis not present

## 2016-08-24 DIAGNOSIS — O26891 Other specified pregnancy related conditions, first trimester: Secondary | ICD-10-CM | POA: Insufficient documentation

## 2016-08-24 DIAGNOSIS — N760 Acute vaginitis: Secondary | ICD-10-CM

## 2016-08-24 DIAGNOSIS — Z3A01 Less than 8 weeks gestation of pregnancy: Secondary | ICD-10-CM | POA: Insufficient documentation

## 2016-08-24 DIAGNOSIS — B9689 Other specified bacterial agents as the cause of diseases classified elsewhere: Secondary | ICD-10-CM

## 2016-08-24 DIAGNOSIS — R109 Unspecified abdominal pain: Secondary | ICD-10-CM | POA: Diagnosis not present

## 2016-08-24 LAB — URINALYSIS, ROUTINE W REFLEX MICROSCOPIC
BILIRUBIN URINE: NEGATIVE
GLUCOSE, UA: NEGATIVE mg/dL
KETONES UR: 40 mg/dL — AB
Leukocytes, UA: NEGATIVE
Nitrite: NEGATIVE
PH: 6 (ref 5.0–8.0)
Protein, ur: NEGATIVE mg/dL
Specific Gravity, Urine: 1.025 (ref 1.005–1.030)

## 2016-08-24 LAB — URINE MICROSCOPIC-ADD ON

## 2016-08-24 LAB — HCG, QUANTITATIVE, PREGNANCY: hCG, Beta Chain, Quant, S: 3830 m[IU]/mL — ABNORMAL HIGH (ref <5)

## 2016-08-24 MED ORDER — METRONIDAZOLE 500 MG PO TABS: 500.0000 mg | ORAL_TABLET | Freq: Two times a day (BID) | ORAL | 1 refills | Status: DC

## 2016-08-24 MED ORDER — PREPLUS 27-1 MG PO TABS: 1.0000 | ORAL_TABLET | Freq: Every day | ORAL | 13 refills | Status: DC

## 2016-08-24 NOTE — Telephone Encounter (Signed)
Patient no showed for lab appt. Called patient, no answer- left message stating we are trying to reach you as you missed your lab appt today. Please call the front office staff to reschedule. Will send letter

## 2016-08-24 NOTE — Telephone Encounter (Signed)
Pt left message @ 1431 requesting a nurse to call her back.  I called and she stated that she has rescheduled her lab appt for tomorrow @ 0800. She had no questions.

## 2016-08-24 NOTE — MAU Note (Signed)
Having cramps still, when she was here the other day pain was 3/10, today it's 5/10.

## 2016-08-24 NOTE — MAU Provider Note (Signed)
[facility] MAU Attending Note  History     CSN: [id number]  Arrival date & time 08/24/16  2008   First Provider Initiated Contact with Patient 08/24/16 2049      Chief Complaint  Patient presents with  . Abdominal Pain    Lauren Mcclain is a 25 y.o. G3P2002 at [redacted]w[redacted]d who presents to MAU today for evaluation of continued lower abdominal cramping. She was seen here on 08/22/16, HCG on 1658, ultrasound showed [redacted]w[redacted]d IUGS, no YS, no fetal pole, normal adnexa.  Wet prep showed BV, she was prescribed Metronidazole which she never picked up. She was told to follow up at [facility] today, but she missed her appointment.  Patient reports increased intensity of pain, from 3/10 to 5/10.  Denies any abnormal vaginal discharge, vaginal bleeding, fevers, chills, sweats, dysuria, nausea, vomiting, other GI or GU symptoms or other general symptoms.   Obstetric History   G3   P2   T2   P0   A0   L2    SAB0   TAB0   Ectopic0   Multiple0   Live Births2     # Outcome Date GA Lbr Len/2nd Weight Sex Delivery Anes PTL Lv  3 Current           2 Term 06/14/14 [redacted]w[redacted]d -15:48 / 00:28 6 lb 2.9 oz (2.805 kg) F Vag-Spont EPI  LIV     Name: Iracheta,GIRL Oumou     Apgar1:  9                Apgar5: 9  1 Term 01/04/13 [redacted]w[redacted]d 452:31 / 01:11 8 lb 1.1 oz (3.66 kg) M Vag-Vacuum EPI  LIV     Name: Coomer,BOY Sherrise     Apgar1:  7                Apgar5: 9      Past Medical History:  Diagnosis Date  . Chlamydia   . Trichomonas contact, treated     Past Surgical History:  Procedure Laterality Date  . NO PAST SURGERIES      Family History  Problem Relation Age of Onset  . Drug abuse Mother   . Drug abuse Father     Social History  Substance Use Topics  . Smoking status: Current Every Day Smoker    Years: 7.00    Types: Cigars    Last attempt to quit: 05/05/2013  . Smokeless tobacco: Never Used     Comment: black and mild; 1/d  . Alcohol use No     Comment: Not currently    No Known  Allergies  Prescriptions Prior to Admission  Medication Sig Dispense Refill Last Dose  . Aspirin-Acetaminophen-Caffeine (EXCEDRIN PO) Take 2 tablets by mouth daily as needed (headache).   08/24/2016 at Unknown time  . [DISCONTINUED] metroNIDAZOLE (FLAGYL) 500 MG tablet Take 1 tablet (500 mg total) by mouth 2 (two) times daily. (Patient not taking: Reported on 08/24/2016) 14 tablet 0 Not Taking at Unknown time     Physical Exam  BP 122/75   Pulse 70   Temp 98 F (36.7 C)   Resp 18   LMP  (LMP Unknown)  GENERAL: Well-developed, well-nourished female in no acute distress  SKIN: Warm, dry and without erythema PSYCH: Normal mood and affect HEENT: Normocephalic, atraumatic.   LUNGS: Normal respiratory effort, normal breath sounds HEART: Regular rate noted ABDOMEN: Soft, nondistended, mild TTP in lower abdomen  PELVIC: Deferred EXTREMITIES: No edema, no cyanosis,  normal range of movement  MAU Course/MDM  Repeat HCG and ultrasound ordered.   Labs and Imaging   Results for orders placed or performed during the hospital encounter of 08/24/16 (from the past 168 hour(s))  Urinalysis, Routine w reflex microscopic ([facility])   Collection Time: 08/24/16  8:18 PM  Result Value Ref Range   Color, Urine YELLOW YELLOW   APPearance CLEAR CLEAR   Specific Gravity, Urine 1.025 1.005 - 1.030   pH 6.0 5.0 - 8.0   Glucose, UA NEGATIVE NEGATIVE mg/dL   Hgb urine dipstick SMALL (A) NEGATIVE   Bilirubin Urine NEGATIVE NEGATIVE   Ketones, ur 40 (A) NEGATIVE mg/dL   Protein, ur NEGATIVE NEGATIVE mg/dL   Nitrite NEGATIVE NEGATIVE   Leukocytes, UA NEGATIVE NEGATIVE  Urine microscopic-add on   Collection Time: 08/24/16  8:18 PM  Result Value Ref Range   Squamous Epithelial / LPF 6-30 (A) NONE SEEN   WBC, UA 0-5 0 - 5 WBC/hpf   RBC / HPF 0-5 0 - 5 RBC/hpf   Bacteria, UA FEW (A) NONE SEEN  Results for orders placed or performed during the hospital encounter of 08/22/16 (from the past 168 hour(s))   GC/Chlamydia probe amp ([place])[facility]   Collection Time: 08/22/16 12:00 AM  Result Value Ref Range   Chlamydia Negative    Neisseria gonorrhea Negative   Urinalysis, Routine w reflex microscopic (not at [facility])   Collection Time: 08/22/16 10:05 AM  Result Value Ref Range   Color, Urine YELLOW YELLOW   APPearance CLEAR CLEAR   Specific Gravity, Urine 1.010 1.005 - 1.030   pH 6.5 5.0 - 8.0   Glucose, UA NEGATIVE NEGATIVE mg/dL   Hgb urine dipstick NEGATIVE NEGATIVE   Bilirubin Urine NEGATIVE NEGATIVE   Ketones, ur NEGATIVE NEGATIVE mg/dL   Protein, ur NEGATIVE NEGATIVE mg/dL   Nitrite NEGATIVE NEGATIVE   Leukocytes, UA NEGATIVE NEGATIVE  Pregnancy, urine POC   Collection Time: 08/22/16 10:10 AM  Result Value Ref Range   Preg Test, Ur POSITIVE (A) NEGATIVE  CBC   Collection Time: 08/22/16 11:17 AM  Result Value Ref Range   WBC 4.7 4.0 - 10.5 K/uL   RBC 4.33 3.87 - 5.11 MIL/uL   Hemoglobin 13.2 12.0 - 15.0 g/dL   HCT 16.138.6 09.636.0 - 04.546.0 %   MCV 89.1 78.0 - 100.0 fL   MCH 30.5 26.0 - 34.0 pg   MCHC 34.2 30.0 - 36.0 g/dL   RDW 40.913.1 81.111.5 - 91.415.5 %   Platelets 174 150 - 400 K/uL  HIV antibody   Collection Time: 08/22/16 11:17 AM  Result Value Ref Range   HIV Screen 4th Generation wRfx Non Reactive Non Reactive  hCG, quantitative, pregnancy   Collection Time: 08/22/16 11:17 AM  Result Value Ref Range   hCG, Beta Chain, Quant, S 1,658 (H) <5 mIU/mL  ABO/Rh   Collection Time: 08/22/16 11:17 AM  Result Value Ref Range   ABO/RH(D) B POS   Wet prep, genital   Collection Time: 08/22/16 11:23 AM  Result Value Ref Range   Yeast Wet Prep HPF POC NONE SEEN NONE SEEN   Trich, Wet Prep NONE SEEN NONE SEEN   Clue Cells Wet Prep HPF POC PRESENT (A) NONE SEEN   WBC, Wet Prep HPF POC FEW (A) NONE SEEN   Sperm NONE SEEN    Imaging: [place] Ob Transvaginal  Result Date: 08/24/2016 CLINICAL DATA:  Daily cramps. Unsure of LMP. Quantitative beta HCG is  pending. EXAM: TRANSVAGINAL OB  ULTRASOUND TECHNIQUE: Transvaginal ultrasound was performed for complete evaluation of the gestation as well as the maternal uterus, adnexal regions, and pelvic cul-de-sac. COMPARISON:  08/22/2016 FINDINGS: Intrauterine gestational sac: A single intrauterine gestational sac is present. Yolk sac:  Yolk sac is visualized. Embryo:  Fetal pole is not identified. Cardiac Activity: Not identified. MSD: 6.8  mm   5 w   2  d Subchorionic hemorrhage:  None visualized. Maternal uterus/adnexae: No myometrial mass lesions demonstrated. Both ovaries are identified and appear normal. No abnormal adnexal masses. Small amount of free fluid in the pelvis. IMPRESSION: Probable early intrauterine gestational sac with yolk sac, but no fetal pole or cardiac activity yet visualized. Recommend follow-up quantitative B-HCG levels and follow-up US in 14 days to assess viability. This recommendation follows SRU consensus guidelines: Diagnostic Criteria for Nonviable Pregnancy Early in the First Trimester. Malva Limes Engl J Med 2013; 161:0960-45; 369:1443-51. Electronically Signed   By: Burman NievesWilliam  Stevens M.D.   On: 08/24/2016 21:25    Assessment and Plan   1. Abdominal pain during pregnancy, first trimester   2. BV (bacterial vaginosis)   3. Normal intrauterine pregnancy on prenatal ultrasound in first trimester     IUP at 4771w2d; normal intrauterine pregnancy with +GS and +YS. No further HCG needed; ultrasounds can be ordered only as needed. Prenatal vitamins prescribed, told to start prenatal care soon Metronidazole sent to another pharmacy as requested by patient. Was told to return to MAU for any pain, bleeding or other concerns, or if her condition were to change or worsen. Discharged to home in stable condition      Medication List    STOP taking these medications   EXCEDRIN PO     TAKE these medications   metroNIDAZOLE 500 MG tablet Commonly known as:  FLAGYL Take 1 tablet (500 mg total) by mouth 2 (two) times daily.   PREPLUS  27-1 MG Tabs Take 1 tablet by mouth daily.        Jaynie CollinsUGONNA  Makayli Bracken, MD, FACOG Attending Obstetrician & Gynecologist, [facility], [facility]

## 2016-08-24 NOTE — Discharge Instructions (Signed)

## 2016-08-25 ENCOUNTER — Ambulatory Visit: Payer: Medicaid Other

## 2016-08-29 NOTE — Progress Notes (Signed)
Patient has been advised of lab results and to start new ob prenatal care at her provider of choice.

## 2016-10-17 NOTE — L&D Delivery Note (Signed)
Delivery Note At 11:18 AM a viable female was delivered via Vaginal, Spontaneous Delivery (Presentation: OA, vertec  ).  APGAR: 8, 9; weight  pending.   Placenta status: delivered intact with gentle traction  Cord: 3 vessel  with the following complications: none .  Cord pH: not sent  Anesthesia:  epidural Episiotomy: None Lacerations: Periurethral Est. Blood Loss (mL): 100  Mom to postpartum.  Baby to Couplet care / Skin to Skin.  Ernestina Pennaicholas Tammala Weider 04/04/2017, 11:32 AM

## 2016-11-18 ENCOUNTER — Inpatient Hospital Stay (HOSPITAL_COMMUNITY)
Admission: AD | Admit: 2016-11-18 | Discharge: 2016-11-19 | Disposition: A | Discharge status: Home / Self Care | Payer: Medicaid Other | Source: Ambulatory Visit | Attending: Obstetrics & Gynecology | Admitting: Obstetrics & Gynecology

## 2016-11-18 ENCOUNTER — Encounter (HOSPITAL_COMMUNITY): Payer: Self-pay

## 2016-11-18 DIAGNOSIS — Z87891 Personal history of nicotine dependence: Secondary | ICD-10-CM | POA: Diagnosis not present

## 2016-11-18 DIAGNOSIS — Z3A17 17 weeks gestation of pregnancy: Secondary | ICD-10-CM | POA: Insufficient documentation

## 2016-11-18 DIAGNOSIS — N9489 Other specified conditions associated with female genital organs and menstrual cycle: Secondary | ICD-10-CM | POA: Diagnosis not present

## 2016-11-18 DIAGNOSIS — O26892 Other specified pregnancy related conditions, second trimester: Secondary | ICD-10-CM | POA: Diagnosis not present

## 2016-11-18 DIAGNOSIS — R1030 Lower abdominal pain, unspecified: Secondary | ICD-10-CM | POA: Diagnosis present

## 2016-11-18 LAB — URINALYSIS, ROUTINE W REFLEX MICROSCOPIC
BILIRUBIN URINE: NEGATIVE
Bacteria, UA: NONE SEEN
Glucose, UA: NEGATIVE mg/dL
HGB URINE DIPSTICK: NEGATIVE
Ketones, ur: NEGATIVE mg/dL
NITRITE: NEGATIVE
PH: 6 (ref 5.0–8.0)
Protein, ur: NEGATIVE mg/dL
SPECIFIC GRAVITY, URINE: 1.019 (ref 1.005–1.030)

## 2016-11-18 NOTE — MAU Note (Signed)
Having pain in R lower abd that comes and goes and radiates down R leg. Present for 1.5 months but worse today. Denies vag bleeding or d/c

## 2016-11-18 NOTE — MAU Provider Note (Signed)
Chief Complaint:  Abdominal Pain and Leg Pain   First Provider Initiated Contact with Patient 11/18/16 2316     HPI: Lauren Mcclain is a 26 y.o. G3P2002 at 3417w4dwho presents to maternity admissions reporting lower abdominal cramping, tightening for several weeks. States hurts mostly on right.  Has intermittent episodes, sometimes they are frequent, sometimes more spaced out. .She denies LOF, vaginal bleeding, vaginal itching/burning, urinary symptoms, h/a, dizziness, n/v, diarrhea, constipation or fever/chills.    Abdominal Pain  This is a new problem. The current episode started 1 to 4 weeks ago. The onset quality is gradual. The problem occurs intermittently. The problem has been waxing and waning. The pain is located in the suprapubic region and RLQ. The quality of the pain is aching and cramping. The abdominal pain does not radiate. Pertinent negatives include no constipation, diarrhea, dysuria, fever, headaches, myalgias, nausea or vomiting. Nothing aggravates the pain. The pain is relieved by nothing. She has tried nothing for the symptoms.   RN Note: Having pain in R lower abd that comes and goes and radiates down R leg. Present for 1.5 months but worse today. Denies vag bleeding or d/c  Past Medical History: Past Medical History:  Diagnosis Date  . Chlamydia   . Trichomonas contact, treated     Past obstetric history: OB History  Gravida Para Term Preterm AB Living  3 2 2     2   SAB TAB Ectopic Multiple Live Births          2    # Outcome Date GA Lbr Len/2nd Weight Sex Delivery Anes PTL Lv  3 Current           2 Term 06/14/14 843w1d -15:48 / 00:28 6 lb 2.9 oz (2.805 kg) F Vag-Spont EPI  LIV  1 Term 01/04/13 7758w0d 452:31 / 01:11 8 lb 1.1 oz (3.66 kg) M Vag-Vacuum EPI  LIV      Past Surgical History: Past Surgical History:  Procedure Laterality Date  . NO PAST SURGERIES      Family History: Family History  Problem Relation Age of Onset  . Drug abuse Mother   . Drug  abuse Father     Social History: Social History  Substance Use Topics  . Smoking status: Former Smoker    Years: 7.00    Types: Cigars    Quit date: 05/05/2013  . Smokeless tobacco: Never Used     Comment: black and mild; 1/d  . Alcohol use No     Comment: Not currently    Allergies: No Known Allergies  Meds:  Prescriptions Prior to Admission  Medication Sig Dispense Refill Last Dose  . metroNIDAZOLE (FLAGYL) 500 MG tablet Take 1 tablet (500 mg total) by mouth 2 (two) times daily. 14 tablet 1   . Prenatal Vit-Fe Fumarate-FA (PREPLUS) 27-1 MG TABS Take 1 tablet by mouth daily. 30 tablet 13     I have reviewed patient's Past Medical Hx, Surgical Hx, Family Hx, Social Hx, medications and allergies.   ROS:  Review of Systems  Constitutional: Negative for fever.  Gastrointestinal: Positive for abdominal pain. Negative for constipation, diarrhea, nausea and vomiting.  Genitourinary: Negative for dysuria.  Musculoskeletal: Negative for myalgias.  Neurological: Negative for headaches.   Other systems negative  Physical Exam  Patient Vitals for the past 24 hrs:  BP Temp Pulse Resp Height Weight  11/18/16 2039 114/60 98.4 F (36.9 C) 79 18 5\' 9"  (1.753 m) 139 lb 0.6 oz (63.1 kg)  Constitutional: Well-developed, well-nourished female in no acute distress.  Cardiovascular: normal rate and rhythm Respiratory: normal effort, clear to auscultation bilaterally GI: Abd soft, non-tender, gravid appropriate for gestational age.   No rebound or guarding. MS: Extremities nontender, no edema, normal ROM Neurologic: Alert and oriented x 4.  GU: Neg CVAT.  PELVIC EXAM: Cervix firm, posterior, neg CMT, uterus nontender, Fundal Height consistent with dates, adnexa without tenderness, enlargement, or mass     Labs: Results for orders placed or performed during the hospital encounter of 11/18/16 (from the past 24 hour(s))  Urinalysis, Routine w reflex microscopic     Status: Abnormal    Collection Time: 11/18/16  8:45 PM  Result Value Ref Range   Color, Urine YELLOW YELLOW   APPearance CLEAR CLEAR   Specific Gravity, Urine 1.019 1.005 - 1.030   pH 6.0 5.0 - 8.0   Glucose, UA NEGATIVE NEGATIVE mg/dL   Hgb urine dipstick NEGATIVE NEGATIVE   Bilirubin Urine NEGATIVE NEGATIVE   Ketones, ur NEGATIVE NEGATIVE mg/dL   Protein, ur NEGATIVE NEGATIVE mg/dL   Nitrite NEGATIVE NEGATIVE   Leukocytes, UA TRACE (A) NEGATIVE   RBC / HPF 0-5 0 - 5 RBC/hpf   WBC, UA 0-5 0 - 5 WBC/hpf   Bacteria, UA NONE SEEN NONE SEEN   Squamous Epithelial / LPF 0-5 (A) NONE SEEN   Mucous PRESENT    --/--/B POS (11/06 1117)  Imaging:  No results found.  MAU Course/MDM: I have ordered labs and reviewed results. No sign of UTI We pushed PO fluids  Observed her for an additional hour and she stated that she only had 2 contractions Cervix is long and closed.    Assessment: Single IUP at [redacted]w[redacted]d Uterine irritability  Plan: Discharge home Preterm Labor precautions and fetal kick counts Follow up in Office for prenatal visits and recheck of cervix Has new OB appt next week at [facility]  Encouraged to return here or to other Urgent Care/ED if she develops worsening of symptoms, increase in pain, fever, or other concerning symptoms.   Pt stable at time of discharge.  Wynelle Bourgeois CNM, MSN Certified Nurse-Midwife 11/18/2016 11:17 PM

## 2016-11-19 DIAGNOSIS — N9489 Other specified conditions associated with female genital organs and menstrual cycle: Secondary | ICD-10-CM

## 2016-11-19 NOTE — Discharge Instructions (Signed)
Second Trimester of Pregnancy The second trimester is from week 13 through week 28, month 4 through 6. This is often the time in pregnancy that you feel your best. Often times, morning sickness has lessened or quit. You may have more energy, and you may get hungry more often. Your unborn baby (fetus) is growing rapidly. At the end of the sixth month, he or she is about 9 inches long and weighs about 1 pounds. You will likely feel the baby move (quickening) between 18 and 20 weeks of pregnancy. Follow these instructions at home:  Avoid all smoking, herbs, and alcohol. Avoid drugs not approved by your doctor.  Do not use any tobacco products, including cigarettes, chewing tobacco, and electronic cigarettes. If you need help quitting, ask your doctor. You may get counseling or other support to help you quit.  Only take medicine as told by your doctor. Some medicines are safe and some are not during pregnancy.  Exercise only as told by your doctor. Stop exercising if you start having cramps.  Eat regular, healthy meals.  Wear a good support bra if your breasts are tender.  Do not use hot tubs, steam rooms, or saunas.  Wear your seat belt when driving.  Avoid raw meat, uncooked cheese, and liter boxes and soil used by cats.  Take your prenatal vitamins.  Take 1500-2000 milligrams of calcium daily starting at the 20th week of pregnancy until you deliver your baby.  Try taking medicine that helps you poop (stool softener) as needed, and if your doctor approves. Eat more fiber by eating fresh fruit, vegetables, and whole grains. Drink enough fluids to keep your pee (urine) clear or pale yellow.  Take warm water baths (sitz baths) to soothe pain or discomfort caused by hemorrhoids. Use hemorrhoid cream if your doctor approves.  If you have puffy, bulging veins (varicose veins), wear support hose. Raise (elevate) your feet for 15 minutes, 3-4 times a day. Limit salt in your diet.  Avoid heavy  lifting, wear low heals, and sit up straight.  Rest with your legs raised if you have leg cramps or low back pain.  Visit your dentist if you have not gone during your pregnancy. Use a soft toothbrush to brush your teeth. Be gentle when you floss.  You can have sex (intercourse) unless your doctor tells you not to.  Go to your doctor visits. Get help if:  You feel dizzy.  You have mild cramps or pressure in your lower belly (abdomen).  You have a nagging pain in your belly area.  You continue to feel sick to your stomach (nauseous), throw up (vomit), or have watery poop (diarrhea).  You have bad smelling fluid coming from your vagina.  You have pain with peeing (urination). Get help right away if:  You have a fever.  You are leaking fluid from your vagina.  You have spotting or bleeding from your vagina.  You have severe belly cramping or pain.  You lose or gain weight rapidly.  You have trouble catching your breath and have chest pain.  You notice sudden or extreme puffiness (swelling) of your face, hands, ankles, feet, or legs.  You have not felt the baby move in over an hour.  You have severe headaches that do not go away with medicine.  You have vision changes. This information is not intended to replace advice given to you by your health care provider. Make sure you discuss any questions you have with your health care   provider. Document Released: 12/28/2009 Document Revised: 03/10/2016 Document Reviewed: 12/04/2012 Elsevier Interactive Patient Education  2017 [employer]  

## 2016-11-24 ENCOUNTER — Encounter: Payer: Self-pay | Admitting: Certified Nurse Midwife

## 2016-11-24 ENCOUNTER — Other Ambulatory Visit (HOSPITAL_COMMUNITY)
Admission: RE | Admit: 2016-11-24 | Discharge: 2016-11-24 | Disposition: A | Discharge status: Home / Self Care | Payer: Medicaid Other | Source: Ambulatory Visit | Attending: Certified Nurse Midwife | Admitting: Certified Nurse Midwife

## 2016-11-24 ENCOUNTER — Ambulatory Visit (INDEPENDENT_AMBULATORY_CARE_PROVIDER_SITE_OTHER): Payer: Medicaid Other | Admitting: Certified Nurse Midwife

## 2016-11-24 VITALS — BP 97/59 | HR 84 | Wt 137.0 lb

## 2016-11-24 DIAGNOSIS — O219 Vomiting of pregnancy, unspecified: Secondary | ICD-10-CM | POA: Diagnosis not present

## 2016-11-24 DIAGNOSIS — Z01419 Encounter for gynecological examination (general) (routine) without abnormal findings: Secondary | ICD-10-CM | POA: Insufficient documentation

## 2016-11-24 DIAGNOSIS — O0932 Supervision of pregnancy with insufficient antenatal care, second trimester: Secondary | ICD-10-CM

## 2016-11-24 DIAGNOSIS — O0930 Supervision of pregnancy with insufficient antenatal care, unspecified trimester: Secondary | ICD-10-CM

## 2016-11-24 DIAGNOSIS — Z113 Encounter for screening for infections with a predominantly sexual mode of transmission: Secondary | ICD-10-CM | POA: Insufficient documentation

## 2016-11-24 DIAGNOSIS — Z3490 Encounter for supervision of normal pregnancy, unspecified, unspecified trimester: Secondary | ICD-10-CM

## 2016-11-24 LAB — POCT URINALYSIS DIPSTICK
BILIRUBIN UA: NEGATIVE
Blood, UA: NEGATIVE
GLUCOSE UA: NEGATIVE
Ketones, UA: NEGATIVE
Leukocytes, UA: NEGATIVE
NITRITE UA: NEGATIVE
Spec Grav, UA: 1.01
UROBILINOGEN UA: NEGATIVE
pH, UA: 8

## 2016-11-24 MED ORDER — VITAFOL GUMMIES 3.33-0.333-34.8 MG PO CHEW: 3.0000 | CHEWABLE_TABLET | Freq: Every day | ORAL | 12 refills | Status: DC

## 2016-11-24 MED ORDER — ONDANSETRON HCL 8 MG PO TABS: 8.0000 mg | ORAL_TABLET | Freq: Three times a day (TID) | ORAL | 2 refills | Status: DC | PRN

## 2016-11-24 NOTE — Progress Notes (Signed)
Pt declines Babyscript App.

## 2016-11-24 NOTE — Addendum Note (Signed)
Addended by: Marya LandryFOSTER, Lacrecia Delval D on: 11/24/2016 03:24 PM   Modules accepted: Orders

## 2016-11-24 NOTE — Progress Notes (Signed)
Subjective:    Lauren Mcclain is being seen today for her first obstetrical visit.  This is not a planned pregnancy. She is at [redacted]w[redacted]d gestation. Her obstetrical history is significant for smoker. Relationship with FOB: significant other, living together. Patient does intend to breast feed. Pregnancy history fully reviewed.  The information documented in the HPI was reviewed and verified.  Menstrual History: OB History    Gravida Para Term Preterm AB Living   3 2 2     2    SAB TAB Ectopic Multiple Live Births           2       No LMP recorded (lmp unknown). Patient is pregnant.    Past Medical History:  Diagnosis Date  . Chlamydia   . Trichomonas contact, treated     Past Surgical History:  Procedure Laterality Date  . NO PAST SURGERIES       (Not in a hospital admission) No Known Allergies  Social History  Substance Use Topics  . Smoking status: Former Smoker    Years: 7.00    Types: Cigars    Quit date: 05/05/2013  . Smokeless tobacco: Never Used     Comment: black and mild; 1/d  . Alcohol use No     Comment: Not currently    Family History  Problem Relation Age of Onset  . Drug abuse Mother   . Drug abuse Father      Review of Systems Constitutional: negative for weight loss Gastrointestinal: + for vomiting Genitourinary:negative for genital lesions and vaginal discharge and dysuria Musculoskeletal:negative for back pain Behavioral/Psych: negative for abusive relationship, depression, illegal drug usage and tobacco use    Objective:    BP (!) 97/59   Pulse 84   Wt 137 lb (62.1 kg)   LMP  (LMP Unknown)   BMI 20.23 kg/m  General Appearance:    Alert, cooperative, no distress, appears stated age  Head:    Normocephalic, without obvious abnormality, atraumatic  Eyes:    PERRL, conjunctiva/corneas clear, EOM's intact, fundi    benign, both eyes  Ears:    Normal TM's and external ear canals, both ears  Nose:   Nares normal, septum midline, mucosa normal,  no drainage    or sinus tenderness  Throat:   Lips, mucosa, and tongue normal; teeth and gums normal  Neck:   Supple, symmetrical, trachea midline, no adenopathy;    thyroid:  no enlargement/tenderness/nodules; no carotid   bruit or JVD  Back:     Symmetric, no curvature, ROM normal, no CVA tenderness  Lungs:     Clear to auscultation bilaterally, respirations unlabored  Chest Wall:    No tenderness or deformity   Heart:    Regular rate and rhythm, S1 and S2 normal, no murmur, rub   or gallop  Breast Exam:    No tenderness, masses, or nipple abnormality  Abdomen:     Soft, non-tender, bowel sounds active all four quadrants,    no masses, no organomegaly  Genitalia:    Normal female without lesion, discharge or tenderness  Extremities:   Extremities normal, atraumatic, no cyanosis or edema  Pulses:   2+ and symmetric all extremities  Skin:   Skin color, texture, turgor normal, no rashes or lesions  Lymph nodes:   Cervical, supraclavicular, and axillary nodes normal  Neurologic:   CNII-XII intact, normal strength, sensation and reflexes    throughout     Cervix: ?funneling, short on exam, closed.  Friable on exam.                                 FHR: 146 by doppler.  FH: @U .     Lab Review Urine pregnancy test Labs reviewed yes Radiologic studies reviewed yes Assessment:    Pregnancy at 4961w3d weeks   N&V in early pregnancy  Friable cervix  ?cervical funneling, short on digital exam.   Plan:     TUVS @ MFM Prenatal vitamins.  Counseling provided regarding continued use of seat belts, cessation of alcohol consumption, smoking or use of illicit drugs; infection precautions i.e., influenza/TDAP immunizations, toxoplasmosis,CMV, parvovirus, listeria and varicella; workplace safety, exercise during pregnancy; routine dental care, safe medications, sexual activity, hot tubs, saunas, pools, travel, caffeine use, fish and methlymercury, potential toxins, hair treatments, varicose  veins Weight gain recommendations per IOM guidelines reviewed: underweight/BMI< 18.5--> gain 28 - 40 lbs; normal weight/BMI 18.5 - 24.9--> gain 25 - 35 lbs; overweight/BMI 25 - 29.9--> gain 15 - 25 lbs; obese/BMI >30->gain  11 - 20 lbs Problem list reviewed and updated. FIRST/CF mutation testing/NIPT/QUAD SCREEN/fragile X/Ashkenazi Jewish population testing/Spinal muscular atrophy discussed: ordered. Role of ultrasound in pregnancy discussed; fetal survey: ordered. Amniocentesis discussed: not indicated. VBAC calculator score: VBAC consent form provided Meds ordered this encounter  Medications  . ondansetron (ZOFRAN) 8 MG tablet    Sig: Take 1 tablet (8 mg total) by mouth every 8 (eight) hours as needed for nausea or vomiting.    Dispense:  40 tablet    Refill:  2  . Prenatal Vit-Fe Phos-FA-Omega (VITAFOL GUMMIES) 3.33-0.333-34.8 MG CHEW    Sig: Chew 3 tablets by mouth at bedtime.    Dispense:  90 tablet    Refill:  12   Orders Placed This Encounter  Procedures  . Culture, OB Urine  . US MFM OB COMP + 14 WK    Standing Status:   Future    Standing Expiration Date:   01/22/2018    Order Specific Question:   Reason for Exam (SYMPTOM  OR DIAGNOSIS REQUIRED)    Answer:   fetal anatomy scan    Order Specific Question:   Preferred imaging location?    Answer:   [facility]-Ultrasound  . US OB Transvaginal    Standing Status:   Future    Standing Expiration Date:   01/22/2018    Order Specific Question:   Reason for Exam (SYMPTOM  OR DIAGNOSIS REQUIRED)    Answer:   ?funneling, short cervix on digital exam    Order Specific Question:   Preferred imaging location?    Answer:   [facility]-Ultrasound  . Hemoglobinopathy evaluation  . Varicella zoster antibody, IgG  . VITAMIN D 25 Hydroxy (Vit-D Deficiency, Fractures)  . MaterniT21 PLUS Core+SCA    Order Specific Question:   Is the patient insulin dependent?    Answer:   No    Order Specific Question:   Please enter gestational age. This should be  expressed as weeks AND days, i.e. 16w 6d. Enter weeks here. Enter days in next question.    Answer:   4518    Order Specific Question:   Please enter gestational age. This should be expressed as weeks AND days, i.e. 16w 6d. Enter days here. Enter weeks in previous question.    Answer:   3    Order Specific Question:   How was gestational age calculated?    Answer:  Ultrasound    Order Specific Question:   Please give the date of LMP OR Ultrasound OR Estimated date of delivery.    Answer:   04/24/2017    Order Specific Question:   Number of Fetuses (Type of Pregnancy):    Answer:   1    Order Specific Question:   Indications for performing the test? (please choose all that apply):    Answer:   Routine screening    Order Specific Question:   Other Indications? (Y=Yes, N=No)    Answer:   N    Order Specific Question:   If this is a repeat specimen, please indicate the reason:    Answer:   Not indicated    Order Specific Question:   Please specify the patient's race: (C=White/Caucasion, B=Black, I=Native American, A=Asian, H=Hispanic, O=Other, U=Unknown)    Answer:   B    Order Specific Question:   Donor Egg - indicate if the egg was obtained from in vitro fertilization.    Answer:   N    Order Specific Question:   Age of Egg Donor.    Answer:   6    Order Specific Question:   Prior Down Syndrome/ONTD screening during current pregnancy.    Answer:   N    Order Specific Question:   Prior First Trimester Testing    Answer:   N    Order Specific Question:   Prior Second Trimester Testing    Answer:   N    Order Specific Question:   Family History of Neural Tube Defects    Answer:   N    Order Specific Question:   Prior Pregnancy with Down Syndrome    Answer:   N    Order Specific Question:   Please give the patient's weight (in pounds)    Answer:   137  . Obstetric Panel, Including HIV  . ToxASSURE Select 13 (MW), Urine  . Hemoglobin A1c  . POCT urinalysis dipstick    Follow up in 4  weeks. 50% of 30 min visit spent on counseling and coordination of care.

## 2016-11-25 LAB — CERVICOVAGINAL ANCILLARY ONLY
Bacterial vaginitis: POSITIVE — AB
CANDIDA VAGINITIS: NEGATIVE
CHLAMYDIA, DNA PROBE: POSITIVE — AB
Neisseria Gonorrhea: NEGATIVE
Trichomonas: NEGATIVE

## 2016-11-27 LAB — CULTURE, OB URINE

## 2016-11-27 LAB — URINE CULTURE, OB REFLEX

## 2016-11-28 ENCOUNTER — Other Ambulatory Visit: Payer: Self-pay | Admitting: Certified Nurse Midwife

## 2016-11-28 ENCOUNTER — Ambulatory Visit (HOSPITAL_COMMUNITY)
Admission: RE | Admit: 2016-11-28 | Discharge: 2016-11-28 | Disposition: A | Discharge status: Home / Self Care | Payer: Medicaid Other | Source: Ambulatory Visit | Attending: Certified Nurse Midwife | Admitting: Certified Nurse Midwife

## 2016-11-28 DIAGNOSIS — O0932 Supervision of pregnancy with insufficient antenatal care, second trimester: Secondary | ICD-10-CM | POA: Diagnosis present

## 2016-11-28 DIAGNOSIS — Z3490 Encounter for supervision of normal pregnancy, unspecified, unspecified trimester: Secondary | ICD-10-CM

## 2016-11-28 DIAGNOSIS — Z3A19 19 weeks gestation of pregnancy: Secondary | ICD-10-CM | POA: Insufficient documentation

## 2016-11-28 DIAGNOSIS — O98812 Other maternal infectious and parasitic diseases complicating pregnancy, second trimester: Principal | ICD-10-CM

## 2016-11-28 DIAGNOSIS — O0930 Supervision of pregnancy with insufficient antenatal care, unspecified trimester: Secondary | ICD-10-CM

## 2016-11-28 DIAGNOSIS — Z3686 Encounter for antenatal screening for cervical length: Secondary | ICD-10-CM | POA: Diagnosis not present

## 2016-11-28 DIAGNOSIS — O26872 Cervical shortening, second trimester: Secondary | ICD-10-CM | POA: Diagnosis not present

## 2016-11-28 DIAGNOSIS — Z3689 Encounter for other specified antenatal screening: Secondary | ICD-10-CM | POA: Diagnosis not present

## 2016-11-28 DIAGNOSIS — N760 Acute vaginitis: Secondary | ICD-10-CM

## 2016-11-28 DIAGNOSIS — A749 Chlamydial infection, unspecified: Secondary | ICD-10-CM

## 2016-11-28 DIAGNOSIS — B9689 Other specified bacterial agents as the cause of diseases classified elsewhere: Secondary | ICD-10-CM

## 2016-11-28 MED ORDER — AZITHROMYCIN 250 MG PO TABS: ORAL_TABLET | ORAL | 0 refills | Status: DC

## 2016-11-28 MED ORDER — METRONIDAZOLE 500 MG PO TABS: 500.0000 mg | ORAL_TABLET | Freq: Two times a day (BID) | ORAL | 0 refills | Status: DC

## 2016-11-29 LAB — OBSTETRIC PANEL, INCLUDING HIV
Antibody Screen: NEGATIVE
BASOS ABS: 0.1 10*3/uL (ref 0.0–0.2)
Basos: 1 %
EOS (ABSOLUTE): 0.8 10*3/uL — ABNORMAL HIGH (ref 0.0–0.4)
Eos: 10 %
HEMATOCRIT: 35.8 % (ref 34.0–46.6)
HEP B S AG: NEGATIVE
HIV SCREEN 4TH GENERATION: NONREACTIVE
Hemoglobin: 12 g/dL (ref 11.1–15.9)
IMMATURE GRANS (ABS): 0 10*3/uL (ref 0.0–0.1)
Immature Granulocytes: 0 %
LYMPHS: 28 %
Lymphocytes Absolute: 2.3 10*3/uL (ref 0.7–3.1)
MCH: 30 pg (ref 26.6–33.0)
MCHC: 33.5 g/dL (ref 31.5–35.7)
MCV: 90 fL (ref 79–97)
Monocytes Absolute: 0.4 10*3/uL (ref 0.1–0.9)
Monocytes: 5 %
NEUTROS ABS: 4.5 10*3/uL (ref 1.4–7.0)
Neutrophils: 56 %
PLATELETS: 178 10*3/uL (ref 150–379)
RBC: 4 x10E6/uL (ref 3.77–5.28)
RDW: 13 % (ref 12.3–15.4)
RPR: NONREACTIVE
Rh Factor: POSITIVE
Rubella Antibodies, IGG: 3.75 index (ref >0.99)
WBC: 8.1 10*3/uL (ref 3.4–10.8)

## 2016-11-29 LAB — HEMOGLOBIN A1C
ESTIMATED AVERAGE GLUCOSE: 91 mg/dL
HEMOGLOBIN A1C: 4.8 % (ref 4.8–5.6)

## 2016-11-29 LAB — HEMOGLOBINOPATHY EVALUATION
HEMOGLOBIN F QUANTITATION: 0 % (ref 0.0–2.0)
HGB A: 97.3 % (ref 96.4–98.8)
HGB C: 0 %
HGB S: 0 %
HGB VARIANT: 0 %
Hemoglobin A2 Quantitation: 2.7 % (ref 1.8–3.2)

## 2016-11-29 LAB — VARICELLA ZOSTER ANTIBODY, IGG: Varicella zoster IgG: 703 index (ref >165)

## 2016-11-29 LAB — VITAMIN D 25 HYDROXY (VIT D DEFICIENCY, FRACTURES): VIT D 25 HYDROXY: 13.9 ng/mL — AB (ref 30.0–100.0)

## 2016-11-29 LAB — CYTOLOGY - PAP: Diagnosis: NEGATIVE

## 2016-11-30 ENCOUNTER — Other Ambulatory Visit: Payer: Self-pay | Admitting: Certified Nurse Midwife

## 2016-11-30 DIAGNOSIS — R7989 Other specified abnormal findings of blood chemistry: Secondary | ICD-10-CM | POA: Insufficient documentation

## 2016-11-30 DIAGNOSIS — Z3480 Encounter for supervision of other normal pregnancy, unspecified trimester: Secondary | ICD-10-CM

## 2016-11-30 DIAGNOSIS — F1210 Cannabis abuse, uncomplicated: Secondary | ICD-10-CM | POA: Insufficient documentation

## 2016-11-30 LAB — TOXASSURE SELECT 13 (MW), URINE

## 2016-11-30 MED ORDER — VITAMIN D (ERGOCALCIFEROL) 1.25 MG (50000 UNIT) PO CAPS: 50000.0000 [IU] | ORAL_CAPSULE | ORAL | 2 refills | Status: DC

## 2016-12-02 LAB — MATERNIT21 PLUS CORE+SCA
Chromosome 13: NEGATIVE
Chromosome 18: NEGATIVE
Chromosome 21: NEGATIVE
Y CHROMOSOME: DETECTED

## 2016-12-05 ENCOUNTER — Other Ambulatory Visit: Payer: Self-pay | Admitting: Certified Nurse Midwife

## 2016-12-05 DIAGNOSIS — Z3480 Encounter for supervision of other normal pregnancy, unspecified trimester: Secondary | ICD-10-CM

## 2016-12-22 ENCOUNTER — Encounter: Payer: Self-pay | Admitting: *Deleted

## 2016-12-22 ENCOUNTER — Encounter: Payer: Medicaid Other | Admitting: Certified Nurse Midwife

## 2017-01-18 ENCOUNTER — Encounter: Payer: Medicaid Other | Admitting: Certified Nurse Midwife

## 2017-02-14 ENCOUNTER — Encounter (HOSPITAL_COMMUNITY): Payer: Self-pay | Admitting: *Deleted

## 2017-02-14 ENCOUNTER — Inpatient Hospital Stay (HOSPITAL_COMMUNITY)
Admission: AD | Admit: 2017-02-14 | Discharge: 2017-02-14 | Disposition: A | Discharge status: Home / Self Care | Payer: Medicaid Other | Source: Ambulatory Visit | Attending: Family Medicine | Admitting: Family Medicine

## 2017-02-14 DIAGNOSIS — O99283 Endocrine, nutritional and metabolic diseases complicating pregnancy, third trimester: Secondary | ICD-10-CM | POA: Diagnosis present

## 2017-02-14 DIAGNOSIS — O4703 False labor before 37 completed weeks of gestation, third trimester: Secondary | ICD-10-CM

## 2017-02-14 DIAGNOSIS — Z79899 Other long term (current) drug therapy: Secondary | ICD-10-CM | POA: Insufficient documentation

## 2017-02-14 DIAGNOSIS — O09293 Supervision of pregnancy with other poor reproductive or obstetric history, third trimester: Secondary | ICD-10-CM

## 2017-02-14 DIAGNOSIS — Z87891 Personal history of nicotine dependence: Secondary | ICD-10-CM | POA: Insufficient documentation

## 2017-02-14 DIAGNOSIS — Z3A30 30 weeks gestation of pregnancy: Secondary | ICD-10-CM | POA: Insufficient documentation

## 2017-02-14 DIAGNOSIS — N760 Acute vaginitis: Secondary | ICD-10-CM | POA: Diagnosis not present

## 2017-02-14 DIAGNOSIS — Z813 Family history of other psychoactive substance abuse and dependence: Secondary | ICD-10-CM | POA: Diagnosis not present

## 2017-02-14 DIAGNOSIS — B9689 Other specified bacterial agents as the cause of diseases classified elsewhere: Secondary | ICD-10-CM

## 2017-02-14 DIAGNOSIS — A749 Chlamydial infection, unspecified: Secondary | ICD-10-CM | POA: Insufficient documentation

## 2017-02-14 DIAGNOSIS — E860 Dehydration: Secondary | ICD-10-CM

## 2017-02-14 DIAGNOSIS — O09893 Supervision of other high risk pregnancies, third trimester: Secondary | ICD-10-CM

## 2017-02-14 LAB — COMPREHENSIVE METABOLIC PANEL
ALBUMIN: 2.7 g/dL — AB (ref 3.5–5.0)
ALK PHOS: 90 U/L (ref 38–126)
ALT: 9 U/L — ABNORMAL LOW (ref 14–54)
ANION GAP: 6 (ref 5–15)
AST: 15 U/L (ref 15–41)
BUN: 8 mg/dL (ref 6–20)
CALCIUM: 8.3 mg/dL — AB (ref 8.9–10.3)
CO2: 20 mmol/L — ABNORMAL LOW (ref 22–32)
Chloride: 108 mmol/L (ref 101–111)
Creatinine, Ser: 0.43 mg/dL — ABNORMAL LOW (ref 0.44–1.00)
GFR calc non Af Amer: >60 mL/min (ref >60)
GLUCOSE: 102 mg/dL — AB (ref 65–99)
Potassium: 3.6 mmol/L (ref 3.5–5.1)
SODIUM: 134 mmol/L — AB (ref 135–145)
Total Bilirubin: 0.4 mg/dL (ref 0.3–1.2)
Total Protein: 6.4 g/dL — ABNORMAL LOW (ref 6.5–8.1)

## 2017-02-14 LAB — URINALYSIS, ROUTINE W REFLEX MICROSCOPIC
Bilirubin Urine: NEGATIVE
GLUCOSE, UA: NEGATIVE mg/dL
Hgb urine dipstick: NEGATIVE
KETONES UR: 80 mg/dL — AB
LEUKOCYTES UA: NEGATIVE
NITRITE: NEGATIVE
PROTEIN: NEGATIVE mg/dL
Specific Gravity, Urine: 1.02 (ref 1.005–1.030)
pH: 6 (ref 5.0–8.0)

## 2017-02-14 LAB — WET PREP, GENITAL
SPERM: NONE SEEN
TRICH WET PREP: NONE SEEN
Yeast Wet Prep HPF POC: NONE SEEN

## 2017-02-14 LAB — CBC
HEMATOCRIT: 27.7 % — AB (ref 36.0–46.0)
HEMOGLOBIN: 9.7 g/dL — AB (ref 12.0–15.0)
MCH: 30.1 pg (ref 26.0–34.0)
MCHC: 35 g/dL (ref 30.0–36.0)
MCV: 86 fL (ref 78.0–100.0)
PLATELETS: 143 10*3/uL — AB (ref 150–400)
RBC: 3.22 MIL/uL — AB (ref 3.87–5.11)
RDW: 13.2 % (ref 11.5–15.5)
WBC: 8.9 10*3/uL (ref 4.0–10.5)

## 2017-02-14 MED ORDER — METRONIDAZOLE 500 MG PO TABS: 500.0000 mg | ORAL_TABLET | Freq: Two times a day (BID) | ORAL | 0 refills | Status: DC

## 2017-02-14 MED ORDER — LACTATED RINGERS IV BOLUS (SEPSIS)
1000.0000 mL | Freq: Once | INTRAVENOUS | Status: AC
  Administered 2017-02-14: 1000 mL via INTRAVENOUS

## 2017-02-14 MED ORDER — DEXTROSE 5 % IN LACTATED RINGERS IV BOLUS
1000.0000 mL | Freq: Once | INTRAVENOUS | Status: AC
  Administered 2017-02-14: 1000 mL via INTRAVENOUS

## 2017-02-14 NOTE — MAU Note (Signed)
Pt reports contractions every 3-5 mins since 8pm. Pt denies LOF or vag bleeding. +FM

## 2017-02-14 NOTE — MAU Provider Note (Signed)
History     CSN: [id number]  Arrival date and time: 02/14/17 0106  First Provider Initiated Contact with Patient 02/14/17 0217      Chief Complaint  Patient presents with  . Contractions   HPI Lauren Mcclain is a 26 y.o. G3P2002 at 42w2dwho presents with contractions. Reports contractions every 3-5 minutes since 8 pm. Rates pain 6/10. Has not treated. Nothing makes better or worse. Denies n/v/d, dysuria, vaginal bleeding, or LOF. Positive fetal movement. Last had intercourse this morning.  [facility] [place] [facility] [facility] for [facility] 2 appointment & hasn't been seen since 2/8.   OB History    Gravida Para Term Preterm AB Living   '3 2 2     2   '$ SAB TAB Ectopic Multiple Live Births           2      Past Medical History:  Diagnosis Date  . Chlamydia   . Trichomonas contact, treated     Past Surgical History:  Procedure Laterality Date  . NO PAST SURGERIES      Family History  Problem Relation Age of Onset  . Drug abuse Mother   . Drug abuse Father     Social History  Substance Use Topics  . Smoking status: Former Smoker    Years: 7.00    Types: Cigars    Quit date: 05/05/2013  . Smokeless tobacco: Never Used     Comment: black and mild; 1/d  . Alcohol use No     Comment: Not currently    Allergies: No Known Allergies  Prescriptions Prior to Admission  Medication Sig Dispense Refill Last Dose  . azithromycin (ZITHROMAX) 250 MG tablet Take 4 tablets all together now. 4 tablet 0   . metroNIDAZOLE (FLAGYL) 500 MG tablet Take 1 tablet (500 mg total) by mouth 2 (two) times daily. 14 tablet 0   . ondansetron (ZOFRAN) 8 MG tablet Take 1 tablet (8 mg total) by mouth every 8 (eight) hours as needed for nausea or vomiting. 40 tablet 2   . Prenatal Vit-Fe Phos-FA-Omega (VITAFOL GUMMIES) 3.33-0.333-34.8 MG CHEW Chew 3 tablets by mouth at bedtime. 90 tablet 12   . Vitamin D, Ergocalciferol, (DRISDOL) 50000 units CAPS capsule Take 1 capsule (50,000 Units  total) by mouth every 7 (seven) days. 30 capsule 2     Review of Systems  Constitutional: Negative.   Gastrointestinal: Positive for abdominal pain. Negative for constipation, diarrhea, nausea and vomiting.  Genitourinary: Negative for dysuria, vaginal bleeding and vaginal discharge.   Physical Exam   Blood pressure (!) 102/52, pulse 66, temperature 97.4 F (36.3 C), temperature source Oral, resp. rate 16, height '5\' 9"'$  (1.753 m), weight 150 lb (68 kg), unknown if currently breastfeeding.  Physical Exam  Nursing note and vitals reviewed. Constitutional: She is oriented to person, place, and time. She appears well-developed and well-nourished. She appears distressed.  HENT:  Head: Normocephalic and atraumatic.  Eyes: Conjunctivae are normal. Right eye exhibits no discharge. Left eye exhibits no discharge. No scleral icterus.  Neck: Normal range of motion.  Cardiovascular: Normal rate, regular rhythm and normal heart sounds.   No murmur heard. Respiratory: Effort normal and breath sounds normal. No respiratory distress. She has no wheezes.  GI: Soft. Bowel sounds are normal. There is no tenderness.  Neurological: She is alert and oriented to person, place, and time.  Skin: Skin is warm and dry. She is not diaphoretic.  Psychiatric: She has a  normal mood and affect. Her behavior is normal. Judgment and thought content normal.   Dilation: 1 Effacement (%): Thick Cervical Position: Posterior Exam by:: [name], NP  Fetal Tracing:  Baseline: 135 Variability: moderate Accelerations: 15x15 Decelerations: none  Toco: Q3-5 mins, 60-100 sec MAU Course  Procedures Results for orders placed or performed during the hospital encounter of 02/14/17 (from the past 48 hour(s))  Urinalysis, Routine w reflex microscopic     Status: Abnormal   Collection Time: 02/14/17  1:10 AM  Result Value Ref Range   Color, Urine YELLOW YELLOW   APPearance CLEAR CLEAR   Specific Gravity, Urine 1.020  1.005 - 1.030   pH 6.0 5.0 - 8.0   Glucose, UA NEGATIVE NEGATIVE mg/dL   Hgb urine dipstick NEGATIVE NEGATIVE   Bilirubin Urine NEGATIVE NEGATIVE   Ketones, ur 80 (A) NEGATIVE mg/dL   Protein, ur NEGATIVE NEGATIVE mg/dL   Nitrite NEGATIVE NEGATIVE   Leukocytes, UA NEGATIVE NEGATIVE  Wet prep, genital     Status: Abnormal   Collection Time: 02/14/17  2:11 AM  Result Value Ref Range   Yeast Wet Prep HPF POC NONE SEEN NONE SEEN   Trich, Wet Prep NONE SEEN NONE SEEN   Clue Cells Wet Prep HPF POC PRESENT (A) NONE SEEN   WBC, Wet Prep HPF POC MODERATE (A) NONE SEEN    Comment: MODERATE BACTERIA SEEN   Sperm NONE SEEN   CBC     Status: Abnormal   Collection Time: 02/14/17  2:19 AM  Result Value Ref Range   WBC 8.9 4.0 - 10.5 K/uL   RBC 3.22 (L) 3.87 - 5.11 MIL/uL   Hemoglobin 9.7 (L) 12.0 - 15.0 g/dL   HCT 27.7 (L) 36.0 - 46.0 %   MCV 86.0 78.0 - 100.0 fL   MCH 30.1 26.0 - 34.0 pg   MCHC 35.0 30.0 - 36.0 g/dL   RDW 13.2 11.5 - 15.5 %   Platelets 143 (L) 150 - 400 K/uL  Comprehensive metabolic panel     Status: Abnormal   Collection Time: 02/14/17  2:19 AM  Result Value Ref Range   Sodium 134 (L) 135 - 145 mmol/L   Potassium 3.6 3.5 - 5.1 mmol/L   Chloride 108 101 - 111 mmol/L   CO2 20 (L) 22 - 32 mmol/L   Glucose, Bld 102 (H) 65 - 99 mg/dL   BUN 8 6 - 20 mg/dL   Creatinine, Ser 0.43 (L) 0.44 - 1.00 mg/dL   Calcium 8.3 (L) 8.9 - 10.3 mg/dL   Total Protein 6.4 (L) 6.5 - 8.1 g/dL   Albumin 2.7 (L) 3.5 - 5.0 g/dL   AST 15 15 - 41 U/L   ALT 9 (L) 14 - 54 U/L   Alkaline Phosphatase 90 38 - 126 U/L   Total Bilirubin 0.4 0.3 - 1.2 mg/dL   GFR calc non Af Amer >60 >60 mL/min   GFR calc Af Amer >60 >60 mL/min    Comment: (NOTE) The eGFR has been calculated using the CKD EPI equation. This calculation has not been validated in all clinical situations. eGFR's persistently <60 mL/min signify possible Chronic Kidney Disease.    Anion gap 6 5 - 15    MDM Reactive fetal  tracing Unable to collect FFN d/t recent intercourse IV fluids x 2 bags (D5LR & LR) -- ctx resolved & patient sleeping SVE unchanged during time in MAU Pt + chlamydia earlier in pregnancy & needs TOC -- GC/CT & wet  pre collected Assessment and Plan  A:  1. Preterm uterine contractions in third trimester, antepartum   2. Dehydration   3. BV (bacterial vaginosis)   4. Hx of maternal Chlamydia infection, currently pregnant, third trimester    P: Discharge home Rx flagyl Discussed reasons to return to MAU Keep follow up appointment with OB/PCP  GC/CT pending   [name] 02/14/2017, 2:17 AM

## 2017-02-14 NOTE — Discharge Instructions (Signed)
. °Preterm Labor and Birth Information °The normal length of a pregnancy is 39-41 weeks. Preterm labor is when labor starts before 37 completed weeks of pregnancy. °What are the risk factors for preterm labor? °Preterm labor is more likely to occur in women who: °· Have certain infections during pregnancy such as a bladder infection, sexually transmitted infection, or infection inside the uterus (chorioamnionitis). °· Have a shorter-than-normal cervix. °· Have gone into preterm labor before. °· Have had surgery on their cervix. °· Are younger than age 17 or older than age 35. °· Are African American. °· Are pregnant with twins or multiple babies (multiple gestation). °· Take street drugs or smoke while pregnant. °· Do not gain enough weight while pregnant. °· Became pregnant shortly after having been pregnant. °What are the symptoms of preterm labor? °Symptoms of preterm labor include: °· Cramps similar to those that can happen during a menstrual period. The cramps may happen with diarrhea. °· Pain in the abdomen or lower back. °· Regular uterine contractions that may feel like tightening of the abdomen. °· A feeling of increased pressure in the pelvis. °· Increased watery or bloody mucus discharge from the vagina. °· Water breaking (ruptured amniotic sac). °Why is it important to recognize signs of preterm labor? °It is important to recognize signs of preterm labor because babies who are born prematurely may not be fully developed. This can put them at an increased risk for: °· Long-term (chronic) heart and lung problems. °· Difficulty immediately after birth with regulating body systems, including blood sugar, body temperature, heart rate, and breathing rate. °· Bleeding in the brain. °· Cerebral palsy. °· Learning difficulties. °· Death. °These risks are highest for babies who are born before 34 weeks of pregnancy. °How is preterm labor treated? °Treatment depends on the length of your pregnancy, your condition,  and the health of your baby. It may involve: °· Having a stitch (suture) placed in your cervix to prevent your cervix from opening too early (cerclage). °· Taking or being given medicines, such as: °¨ Hormone medicines. These may be given early in pregnancy to help support the pregnancy. °¨ Medicine to stop contractions. °¨ Medicines to help mature the baby’s lungs. These may be prescribed if the risk of delivery is high. °¨ Medicines to prevent your baby from developing cerebral palsy. °If the labor happens before 34 weeks of pregnancy, you may need to stay in the hospital. °What should I do if I think I am in preterm labor? °If you think that you are going into preterm labor, call your health care provider right away. °How can I prevent preterm labor in future pregnancies? °To increase your chance of having a full-term pregnancy: °· Do not use any tobacco products, such as cigarettes, chewing tobacco, and e-cigarettes. If you need help quitting, ask your health care provider. °· Do not use street drugs or medicines that have not been prescribed to you during your pregnancy. °· Talk with your health care provider before taking any herbal supplements, even if you have been taking them regularly. °· Make sure you gain a healthy amount of weight during your pregnancy. °· Watch for infection. If you think that you might have an infection, get it checked right away. °· Make sure to tell your health care provider if you have gone into preterm labor before. °This information is not intended to replace advice given to you by your health care provider. Make sure you discuss any questions you have with your   care provider. Document Released: 12/24/2003 Document Revised: 03/15/2016 Document Reviewed: 02/24/2016 Elsevier Interactive Patient Education  2017 Else[employer] Inc.    Bacterial Vaginosis Bacterial vaginosis is a vaginal infection that occurs when the normal balance of bacteria in the vagina is disrupted. It  results from an overgrowth of certain bacteria. This is the most common vaginal infection among women ages 10-44. Because bacterial vaginosis increases your risk for STIs (sexually transmitted infections), getting treated can help reduce your risk for chlamydia, gonorrhea, herpes, and HIV (human immunodeficiency virus). Treatment is also important for preventing complications in pregnant women, because this condition can cause an early (premature) delivery. What are the causes? This condition is caused by an increase in harmful bacteria that are normally present in small amounts in the vagina. However, the reason that the condition develops is not fully understood. What increases the risk? The following factors may make you more likely to develop this condition:  Having a new sexual partner or multiple sexual partners.  Having unprotected sex.  Douching.  Having an intrauterine device (IUD).  Smoking.  Drug and alcohol abuse.  Taking certain antibiotic medicines.  Being pregnant. You cannot get bacterial vaginosis from toilet seats, bedding, swimming pools, or contact with objects around you. What are the signs or symptoms? Symptoms of this condition include:  Grey or white vaginal discharge. The discharge can also be watery or foamy.  A fish-like odor with discharge, especially after sexual intercourse or during menstruation.  Itching in and around the vagina.  Burning or pain with urination. Some women with bacterial vaginosis have no signs or symptoms. How is this diagnosed? This condition is diagnosed based on:  Your medical history.  A physical exam of the vagina.  Testing a sample of vaginal fluid under a microscope to look for a large amount of bad bacteria or abnormal cells. Your health care provider may use a cotton swab or a small wooden spatula to collect the sample. How is this treated? This condition is treated with antibiotics. These may be given as a pill, a  vaginal cream, or a medicine that is put into the vagina (suppository). If the condition comes back after treatment, a second round of antibiotics may be needed. Follow these instructions at home: Medicines   Take over-the-counter and prescription medicines only as told by your health care provider.  Take or use your antibiotic as told by your health care provider. Do not stop taking or using the antibiotic even if you start to feel better. General instructions   If you have a female sexual partner, tell her that you have a vaginal infection. She should see her health care provider and be treated if she has symptoms. If you have a female sexual partner, he does not need treatment.  During treatment:  Avoid sexual activity until you finish treatment.  Do not douche.  Avoid alcohol as directed by your health care provider.  Avoid breastfeeding as directed by your health care provider.  Drink enough water and fluids to keep your urine clear or pale yellow.  Keep the area around your vagina and rectum clean.  Wash the area daily with warm water.  Wipe yourself from front to back after using the toilet.  Keep all follow-up visits as told by your health care provider. This is important. How is this prevented?  Do not douche.  Wash the outside of your vagina with warm water only.  Use protection when having sex. This includes latex condoms and dental  dams.  Limit how many sexual partners you have. To help prevent bacterial vaginosis, it is best to have sex with just one partner (monogamous).  Make sure you and your sexual partner are tested for STIs.  Wear cotton or cotton-lined underwear.  Avoid wearing tight pants and pantyhose, especially during summer.  Limit the amount of alcohol that you drink.  Do not use any products that contain nicotine or tobacco, such as cigarettes and e-cigarettes. If you need help quitting, ask your health care provider.  Do not use illegal  drugs. Where to find more information:  Centers for Disease Control and Prevention: SolutionApps.co.za  American Sexual Health Association (ASHA): www.ashastd.org  U.S. Department of Health and Health and safety inspector, Office on Women's Health: ConventionalMedicines.si or http://www.anderson-williamson.info/ Contact a health care provider if:  Your symptoms do not improve, even after treatment.  You have more discharge or pain when urinating.  You have a fever.  You have pain in your abdomen.  You have pain during sex.  You have vaginal bleeding between periods. Summary  Bacterial vaginosis is a vaginal infection that occurs when the normal balance of bacteria in the vagina is disrupted.  Because bacterial vaginosis increases your risk for STIs (sexually transmitted infections), getting treated can help reduce your risk for chlamydia, gonorrhea, herpes, and HIV (human immunodeficiency virus). Treatment is also important for preventing complications in pregnant women, because the condition can cause an early (premature) delivery.  This condition is treated with antibiotic medicines. These may be given as a pill, a vaginal cream, or a medicine that is put into the vagina (suppository). This information is not intended to replace advice given to you by your health care provider. Make sure you discuss any questions you have with your health care provider. Document Released: 10/03/2005 Document Revised: 06/18/2016 Document Reviewed: 06/18/2016 Elsevier Interactive Patient Education  2017 [employer].

## 2017-02-15 LAB — GC/CHLAMYDIA PROBE AMP (~~LOC~~) NOT AT ARMC
Chlamydia: NEGATIVE
Neisseria Gonorrhea: NEGATIVE

## 2017-03-01 ENCOUNTER — Ambulatory Visit (INDEPENDENT_AMBULATORY_CARE_PROVIDER_SITE_OTHER): Payer: Medicaid Other | Admitting: Obstetrics and Gynecology

## 2017-03-01 DIAGNOSIS — O0993 Supervision of high risk pregnancy, unspecified, third trimester: Secondary | ICD-10-CM

## 2017-03-01 DIAGNOSIS — Z3490 Encounter for supervision of normal pregnancy, unspecified, unspecified trimester: Secondary | ICD-10-CM

## 2017-03-01 NOTE — Progress Notes (Signed)
Subjective:  Lauren Mcclain is a 26 y.o. G3P2002 at 7318w2d being seen today for ongoing prenatal care.  She is currently monitored for the following issues for this low-risk pregnancy and has Supervision of normal pregnancy, antepartum; Low vitamin D level; and Mild tetrahydrocannabinol (THC) abuse on her problem list.  Patient reports no complaints.  Contractions: Irritability. Vag. Bleeding: None.  Movement: Present. Denies leaking of fluid.   The following portions of the patient's history were reviewed and updated as appropriate: allergies, current medications, past family history, past medical history, past social history, past surgical history and problem list. Problem list updated.  Objective:   Vitals:   03/01/17 1516  BP: (!) 113/59  Pulse: 83  Weight: 152 lb (68.9 kg)    Fetal Status:     Movement: Present     General:  Alert, oriented and cooperative. Patient is in no acute distress.  Skin: Skin is warm and dry. No rash noted.   Cardiovascular: Normal heart rate noted  Respiratory: Normal respiratory effort, no problems with respiration noted  Abdomen: Soft, gravid, appropriate for gestational age. Pain/Pressure: Present     Pelvic:  Cervical exam deferred        Extremities: Normal range of motion.  Edema: Trace  Mental Status: Normal mood and affect. Normal behavior. Normal judgment and thought content.   Urinalysis:      Assessment and Plan:  Pregnancy: G3P2002 at 4818w2d  1. Encounter for supervision of normal pregnancy, antepartum, unspecified gravidity Satble  Preterm labor symptoms and general obstetric precautions including but not limited to vaginal bleeding, contractions, leaking of fluid and fetal movement were reviewed in detail with the patient. Please refer to After Visit Summary for other counseling recommendations.  Return in about 2 weeks (around 03/15/2017) for OB visit.   Hermina StaggersErvin, Yaritza Leist L, MD

## 2017-03-01 NOTE — Progress Notes (Signed)
[facility]14/17

## 2017-03-15 ENCOUNTER — Encounter: Payer: Medicaid Other | Admitting: Obstetrics & Gynecology

## 2017-04-04 ENCOUNTER — Encounter (HOSPITAL_COMMUNITY): Payer: Self-pay

## 2017-04-04 ENCOUNTER — Inpatient Hospital Stay (HOSPITAL_COMMUNITY)
Admission: AD | Admit: 2017-04-04 | Discharge: 2017-04-04 | Discharge status: Against Medical Advice | Payer: Medicaid Other | Source: Ambulatory Visit | Attending: Family Medicine | Admitting: Family Medicine

## 2017-04-04 ENCOUNTER — Inpatient Hospital Stay (HOSPITAL_COMMUNITY): Payer: Medicaid Other | Admitting: Anesthesiology

## 2017-04-04 ENCOUNTER — Encounter: Payer: Medicaid Other | Admitting: Certified Nurse Midwife

## 2017-04-04 ENCOUNTER — Inpatient Hospital Stay (HOSPITAL_COMMUNITY)
Admission: AD | Admit: 2017-04-04 | Discharge: 2017-04-05 | DRG: 775 | Disposition: A | Discharge status: Home / Self Care | Payer: Medicaid Other | Source: Ambulatory Visit | Attending: Family Medicine | Admitting: Family Medicine

## 2017-04-04 ENCOUNTER — Encounter (HOSPITAL_COMMUNITY): Payer: Self-pay | Admitting: *Deleted

## 2017-04-04 DIAGNOSIS — D649 Anemia, unspecified: Secondary | ICD-10-CM | POA: Diagnosis present

## 2017-04-04 DIAGNOSIS — Z3A37 37 weeks gestation of pregnancy: Secondary | ICD-10-CM

## 2017-04-04 DIAGNOSIS — Z87891 Personal history of nicotine dependence: Secondary | ICD-10-CM

## 2017-04-04 DIAGNOSIS — Z3493 Encounter for supervision of normal pregnancy, unspecified, third trimester: Secondary | ICD-10-CM | POA: Diagnosis present

## 2017-04-04 DIAGNOSIS — F1210 Cannabis abuse, uncomplicated: Secondary | ICD-10-CM | POA: Diagnosis present

## 2017-04-04 DIAGNOSIS — Z3490 Encounter for supervision of normal pregnancy, unspecified, unspecified trimester: Secondary | ICD-10-CM

## 2017-04-04 DIAGNOSIS — O9902 Anemia complicating childbirth: Secondary | ICD-10-CM | POA: Diagnosis present

## 2017-04-04 DIAGNOSIS — O479 False labor, unspecified: Secondary | ICD-10-CM

## 2017-04-04 DIAGNOSIS — O99324 Drug use complicating childbirth: Secondary | ICD-10-CM | POA: Diagnosis present

## 2017-04-04 LAB — CBC
HCT: 30 % — ABNORMAL LOW (ref 36.0–46.0)
Hemoglobin: 10 g/dL — ABNORMAL LOW (ref 12.0–15.0)
MCH: 28.7 pg (ref 26.0–34.0)
MCHC: 33.3 g/dL (ref 30.0–36.0)
MCV: 86.2 fL (ref 78.0–100.0)
PLATELETS: 152 10*3/uL (ref 150–400)
RBC: 3.48 MIL/uL — AB (ref 3.87–5.11)
RDW: 14.3 % (ref 11.5–15.5)
WBC: 10.1 10*3/uL (ref 4.0–10.5)

## 2017-04-04 LAB — RAPID HIV SCREEN (HIV 1/2 AB+AG)
HIV 1/2 Antibodies: NONREACTIVE
HIV-1 P24 ANTIGEN - HIV24: NONREACTIVE

## 2017-04-04 LAB — OB RESULTS CONSOLE GBS: GBS: NEGATIVE

## 2017-04-04 LAB — RAPID URINE DRUG SCREEN, HOSP PERFORMED
AMPHETAMINES: NOT DETECTED
BENZODIAZEPINES: NOT DETECTED
Barbiturates: NOT DETECTED
Cocaine: NOT DETECTED
Opiates: NOT DETECTED
TETRAHYDROCANNABINOL: POSITIVE — AB

## 2017-04-04 LAB — TYPE AND SCREEN
ABO/RH(D): B POS
Antibody Screen: NEGATIVE

## 2017-04-04 LAB — GROUP B STREP BY PCR: Group B strep by PCR: NEGATIVE

## 2017-04-04 LAB — RPR: RPR Ser Ql: NONREACTIVE

## 2017-04-04 MED ORDER — FENTANYL 2.5 MCG/ML BUPIVACAINE 1/10 % EPIDURAL INFUSION (WH - ANES): 14.0000 mL/h | INTRAMUSCULAR | Status: DC | PRN

## 2017-04-04 MED ORDER — FENTANYL CITRATE (PF) 100 MCG/2ML IJ SOLN
100.0000 ug | INTRAMUSCULAR | Status: DC | PRN
  Administered 2017-04-04: 100 ug via INTRAVENOUS
  Filled 2017-04-04: qty 2

## 2017-04-04 MED ORDER — LACTATED RINGERS IV SOLN: 500.0000 mL | Freq: Once | INTRAVENOUS | Status: DC

## 2017-04-04 MED ORDER — ONDANSETRON HCL 4 MG/2ML IJ SOLN: 4.0000 mg | Freq: Four times a day (QID) | INTRAMUSCULAR | Status: DC | PRN

## 2017-04-04 MED ORDER — ACETAMINOPHEN 325 MG PO TABS: 650.0000 mg | ORAL_TABLET | ORAL | Status: DC | PRN

## 2017-04-04 MED ORDER — ONDANSETRON HCL 4 MG/2ML IJ SOLN: 4.0000 mg | INTRAMUSCULAR | Status: DC | PRN

## 2017-04-04 MED ORDER — BENZOCAINE-MENTHOL 20-0.5 % EX AERO
1.0000 "application " | INHALATION_SPRAY | CUTANEOUS | Status: DC | PRN
  Administered 2017-04-04: 1 via TOPICAL
  Filled 2017-04-04: qty 56

## 2017-04-04 MED ORDER — PHENYLEPHRINE 40 MCG/ML (10ML) SYRINGE FOR IV PUSH (FOR BLOOD PRESSURE SUPPORT): 80.0000 ug | PREFILLED_SYRINGE | INTRAVENOUS | Status: DC | PRN

## 2017-04-04 MED ORDER — OXYTOCIN 40 UNITS IN LACTATED RINGERS INFUSION - SIMPLE MED
2.5000 [IU]/h | INTRAVENOUS | Status: DC
  Filled 2017-04-04: qty 1000

## 2017-04-04 MED ORDER — EPHEDRINE 5 MG/ML INJ: 10.0000 mg | INTRAVENOUS | Status: DC | PRN

## 2017-04-04 MED ORDER — PHENYLEPHRINE 40 MCG/ML (10ML) SYRINGE FOR IV PUSH (FOR BLOOD PRESSURE SUPPORT)
80.0000 ug | PREFILLED_SYRINGE | INTRAVENOUS | Status: DC | PRN
Start: 2017-04-04 — End: 2017-04-04
  Filled 2017-04-04: qty 10

## 2017-04-04 MED ORDER — TETANUS-DIPHTH-ACELL PERTUSSIS 5-2.5-18.5 LF-MCG/0.5 IM SUSP: 0.5000 mL | Freq: Once | INTRAMUSCULAR | Status: DC

## 2017-04-04 MED ORDER — SENNOSIDES-DOCUSATE SODIUM 8.6-50 MG PO TABS
2.0000 | ORAL_TABLET | ORAL | Status: DC
  Administered 2017-04-05: 2 via ORAL
  Filled 2017-04-04: qty 2

## 2017-04-04 MED ORDER — OXYCODONE-ACETAMINOPHEN 5-325 MG PO TABS: 2.0000 | ORAL_TABLET | ORAL | Status: DC | PRN

## 2017-04-04 MED ORDER — LACTATED RINGERS IV SOLN
INTRAVENOUS | Status: DC
  Administered 2017-04-04 (×2): via INTRAVENOUS

## 2017-04-04 MED ORDER — DIPHENHYDRAMINE HCL 50 MG/ML IJ SOLN: 12.5000 mg | INTRAMUSCULAR | Status: DC | PRN

## 2017-04-04 MED ORDER — LACTATED RINGERS IV SOLN
500.0000 mL | Freq: Once | INTRAVENOUS | Status: AC
  Administered 2017-04-04: 500 mL via INTRAVENOUS

## 2017-04-04 MED ORDER — PRENATAL MULTIVITAMIN CH
1.0000 | ORAL_TABLET | Freq: Every day | ORAL | Status: DC
  Administered 2017-04-05: 1 via ORAL
  Filled 2017-04-04: qty 1

## 2017-04-04 MED ORDER — SOD CITRATE-CITRIC ACID 500-334 MG/5ML PO SOLN: 30.0000 mL | ORAL | Status: DC | PRN

## 2017-04-04 MED ORDER — OXYCODONE-ACETAMINOPHEN 5-325 MG PO TABS: 1.0000 | ORAL_TABLET | ORAL | Status: DC | PRN

## 2017-04-04 MED ORDER — ONDANSETRON HCL 4 MG PO TABS: 4.0000 mg | ORAL_TABLET | ORAL | Status: DC | PRN

## 2017-04-04 MED ORDER — LIDOCAINE HCL (PF) 1 % IJ SOLN
INTRAMUSCULAR | Status: DC | PRN
  Administered 2017-04-04 (×2): 5 mL via EPIDURAL

## 2017-04-04 MED ORDER — IBUPROFEN 600 MG PO TABS
600.0000 mg | ORAL_TABLET | Freq: Four times a day (QID) | ORAL | Status: DC
  Administered 2017-04-04 – 2017-04-05 (×4): 600 mg via ORAL
  Filled 2017-04-04 (×4): qty 1

## 2017-04-04 MED ORDER — ZOLPIDEM TARTRATE 5 MG PO TABS: 5.0000 mg | ORAL_TABLET | Freq: Every evening | ORAL | Status: DC | PRN

## 2017-04-04 MED ORDER — LIDOCAINE HCL (PF) 1 % IJ SOLN
30.0000 mL | INTRAMUSCULAR | Status: DC | PRN
  Filled 2017-04-04: qty 30

## 2017-04-04 MED ORDER — FLEET ENEMA 7-19 GM/118ML RE ENEM: 1.0000 | ENEMA | RECTAL | Status: DC | PRN

## 2017-04-04 MED ORDER — WITCH HAZEL-GLYCERIN EX PADS: 1.0000 "application " | MEDICATED_PAD | CUTANEOUS | Status: DC | PRN

## 2017-04-04 MED ORDER — LACTATED RINGERS IV SOLN: 500.0000 mL | INTRAVENOUS | Status: DC | PRN

## 2017-04-04 MED ORDER — SIMETHICONE 80 MG PO CHEW: 80.0000 mg | CHEWABLE_TABLET | ORAL | Status: DC | PRN

## 2017-04-04 MED ORDER — DIPHENHYDRAMINE HCL 25 MG PO CAPS: 25.0000 mg | ORAL_CAPSULE | Freq: Four times a day (QID) | ORAL | Status: DC | PRN

## 2017-04-04 MED ORDER — FENTANYL 2.5 MCG/ML BUPIVACAINE 1/10 % EPIDURAL INFUSION (WH - ANES)
14.0000 mL/h | INTRAMUSCULAR | Status: DC | PRN
  Administered 2017-04-04: 14 mL/h via EPIDURAL
  Filled 2017-04-04: qty 100

## 2017-04-04 MED ORDER — OXYTOCIN BOLUS FROM INFUSION
500.0000 mL | Freq: Once | INTRAVENOUS | Status: AC
  Administered 2017-04-04: 500 mL via INTRAVENOUS

## 2017-04-04 MED ORDER — COCONUT OIL OIL: 1.0000 "application " | TOPICAL_OIL | Status: DC | PRN

## 2017-04-04 MED ORDER — DIBUCAINE 1 % RE OINT: 1.0000 "application " | TOPICAL_OINTMENT | RECTAL | Status: DC | PRN

## 2017-04-04 NOTE — MAU Note (Signed)
Was here earlier, contractions have gotten much worse. Still intact

## 2017-04-04 NOTE — H&P (Signed)
LABOR AND DELIVERY ADMISSION HISTORY AND PHYSICAL NOTE  FYNLEY CHRYSTAL is a 26 y.o. female G79P2002 with IUP at [redacted]w[redacted]d by 5 wk 2d [place] presenting for SOL.   She reports positive fetal movement. She denies leakage of fluid or vaginal bleeding.  Prenatal History/Complications:  Past Medical History: Past Medical History:  Diagnosis Date  . Chlamydia   . Trichomonas contact, treated     Past Surgical History: Past Surgical History:  Procedure Laterality Date  . NO PAST SURGERIES      Obstetrical History: OB History    Gravida Para Term Preterm AB Living   3 2 2     2    SAB TAB Ectopic Multiple Live Births           2      Social History: Social History   Social History  . Marital status: Single    Spouse name: N/A  . Number of children: N/A  . Years of education: N/A   Social History Main Topics  . Smoking status: Former Smoker    Years: 7.00    Types: Cigars    Quit date: 05/05/2013  . Smokeless tobacco: Never Used     Comment: black and mild; 1/d  . Alcohol use No     Comment: Not currently  . Drug use: Yes    Frequency: 28.0 times per week    Types: Marijuana     Comment: last smoked today  . Sexual activity: Yes    Birth control/ protection: None   Other Topics Concern  . None   Social History Narrative  . None    Family History: Family History  Problem Relation Age of Onset  . Drug abuse Mother   . Drug abuse Father     Allergies: No Known Allergies  Prescriptions Prior to Admission  Medication Sig Dispense Refill Last Dose  . Prenatal Vit-Fe Phos-FA-Omega (VITAFOL GUMMIES) 3.33-0.333-34.8 MG CHEW Chew 3 tablets by mouth at bedtime. 90 tablet 12 Past Week at Unknown time  . Vitamin D, Ergocalciferol, (DRISDOL) 50000 units CAPS capsule Take 1 capsule (50,000 Units total) by mouth every 7 (seven) days. (Patient not taking: Reported on 04/04/2017) 30 capsule 2 Not Taking at Unknown time     Review of Systems   All systems reviewed and negative  except as stated in HPI  Blood pressure 117/76, pulse 78, temperature 97.6 F (36.4 C), temperature source Oral, resp. rate 18, height 5\' 9"  (1.753 m), weight 160 lb (72.6 kg), SpO2 98 %, unknown if currently breastfeeding. General appearance: alert, cooperative and appears stated age Lungs: clear to auscultation bilaterally Heart: regular rate and rhythm Abdomen: soft, non-tender; bowel sounds normal Extremities: No calf swelling or tenderness Presentation: cephalic by nursing exam Fetal monitoring: category 1 Uterine activity: contractions q3 minutes Dilation:  (pt refusing to be checked ) Effacement (%): 100 Station: 0   Prenatal labs: ABO, Rh: --/--/B POS (06/19 0725) Antibody: NEG (06/19 0725) Rubella: immune RPR: Non Reactive (02/08 1700)  HBsAg: Negative (02/08 1700)  HIV: Non Reactive (02/08 1700)  GBS: Negative (06/19 0000)  A1c 4.8 Genetic screening:  mat21 normal Anatomy US: normal  Prenatal Transfer Tool  Maternal Diabetes: No Genetic Screening: Normal Maternal Ultrasounds/Referrals: Normal Fetal Ultrasounds or other Referrals:  None Maternal Substance Abuse:  No Significant Maternal Medications:  None Significant Maternal Lab Results: None  Results for orders placed or performed during the hospital encounter of 04/04/17 (from the past 24 hour(s))  OB RESULT CONSOLE  Group B Strep   Collection Time: 04/04/17 12:00 AM  Result Value Ref Range   GBS Negative   CBC   Collection Time: 04/04/17  7:25 AM  Result Value Ref Range   WBC 10.1 4.0 - 10.5 K/uL   RBC 3.48 (L) 3.87 - 5.11 MIL/uL   Hemoglobin 10.0 (L) 12.0 - 15.0 g/dL   HCT 40.930.0 (L) 81.136.0 - 91.446.0 %   MCV 86.2 78.0 - 100.0 fL   MCH 28.7 26.0 - 34.0 pg   MCHC 33.3 30.0 - 36.0 g/dL   RDW 78.214.3 95.611.5 - 21.315.5 %   Platelets 152 150 - 400 K/uL  Rapid HIV screen (HIV 1/2 Ab+Ag)   Collection Time: 04/04/17  7:25 AM  Result Value Ref Range   HIV-1 P24 Antigen - HIV24 NON REACTIVE NON REACTIVE   HIV 1/2  Antibodies NON REACTIVE NON REACTIVE   Interpretation (HIV Ag Ab)      A non reactive test result means that HIV 1 or HIV 2 antibodies and HIV 1 p24 antigen were not detected in the specimen.  Type and screen [facility]   Collection Time: 04/04/17  7:25 AM  Result Value Ref Range   ABO/RH(D) B POS    Antibody Screen NEG    Sample Expiration 04/07/2017   Urine rapid drug screen (hosp performed)   Collection Time: 04/04/17  7:45 AM  Result Value Ref Range   Opiates NONE DETECTED NONE DETECTED   Cocaine NONE DETECTED NONE DETECTED   Benzodiazepines NONE DETECTED NONE DETECTED   Amphetamines NONE DETECTED NONE DETECTED   Tetrahydrocannabinol POSITIVE (A) NONE DETECTED   Barbiturates NONE DETECTED NONE DETECTED  Results for orders placed or performed during the hospital encounter of 04/04/17 (from the past 24 hour(s))  Group B strep by PCR   Collection Time: 04/04/17  1:40 AM  Result Value Ref Range   Group B strep by PCR NEGATIVE NEGATIVE    Patient Active Problem List   Diagnosis Date Noted  . Normal labor 04/04/2017  . Low vitamin D level 11/30/2016  . Mild tetrahydrocannabinol (THC) abuse 11/30/2016  . Supervision of normal pregnancy, antepartum 11/24/2016    Assessment: Oletta Cohnbony N Jupin is a 26 y.o. G3P2002 at 7284w1d here for  SOL  #Labor:expectant managment #Pain: Epidural in place #FWB:   #ID:  Category 1 #MOF:  Breast and bottle #MOC:none possibly tubal  #Circ:  Out patient  Ernestina Pennaicholas Ariadna Setter 04/04/2017, 9:19 AM

## 2017-04-04 NOTE — MAU Note (Signed)
Pt reports uc's since 10pm. + FM. Denies LOF or vaginal bleeding

## 2017-04-04 NOTE — Lactation Note (Signed)
This note was copied from a baby's chart. Lactation Consultation Note  Patient Name: Lauren Mcclain ZOXWR'UToday's Date: 04/04/2017  LC attempted visit at 10 hours of age.  Mom asleep and baby asleep in crib.   LC discussed with Dr, Ivonne AndrewNagapan (face to face) mom history of THC + this admission and chart indicating mom admitting to use of Marijuana 28x/week.  Dr. [facility] AndrewNagapan [facility] and [facility] RN Marcelino DusterMichelle to discourage breastfeeding due to substance use although mom may choose to continue breastfeeding.   LC discussed with RN concerns regarding minimal breast feedings at 10 hours of age with baby 144w1d at 6#0oz.   Lc reported to RN to share late preterm feeding guidelines and to encouraged mom to offer baby increased volume with feedings.  LC to follow when mom is awake and LC services available       Maternal Data    Feeding Feeding Type: Breast Fed Length of feed: 15 min  LATCH Score/Interventions                      Lactation Tools Discussed/Used     Consult Status      Shoptaw, Arvella MerlesJana Lynn 04/04/2017, 10:09 PM

## 2017-04-04 NOTE — MAU Note (Signed)
Pt. demands to see doctor and wants something for pain. Explain to patient the labor evaluation process, pt. crying and in pain.  Nurse explained to patient if she is given something for pain, that she would have to stay until medication is out of system.  Pt. drove herself to the hospital.  Also informed patient that if she could have someone come and pick her up, that we can medicate her. Dr. Omer JackMumaw notified patient wants to see her. Dr. Omer JackMumaw  currently in a delivery and asked to have the MAU provider see the patient.  MAU provider called.

## 2017-04-04 NOTE — Anesthesia Preprocedure Evaluation (Signed)
Anesthesia Evaluation  Patient identified by MRN, date of birth, ID band Patient awake    Reviewed: Allergy & Precautions, H&P , NPO status , Patient's Chart, lab work & pertinent test results  Airway Mallampati: II   Neck ROM: full    Dental   Pulmonary former smoker,    breath sounds clear to auscultation       Cardiovascular negative cardio ROS   Rhythm:regular Rate:Normal     Neuro/Psych    GI/Hepatic   Endo/Other    Renal/GU      Musculoskeletal   Abdominal   Peds  Hematology   Anesthesia Other Findings   Reproductive/Obstetrics (+) Pregnancy                             Anesthesia Physical Anesthesia Plan  ASA: II  Anesthesia Plan: Epidural   Post-op Pain Management:    Induction: Intravenous  PONV Risk Score and Plan: 2 and Treatment may vary due to age or medical condition  Airway Management Planned: Natural Airway  Additional Equipment:   Intra-op Plan:   Post-operative Plan:   Informed Consent: I have reviewed the patients History and Physical, chart, labs and discussed the procedure including the risks, benefits and alternatives for the proposed anesthesia with the patient or authorized representative who has indicated his/her understanding and acceptance.       Plan Discussed with: Anesthesiologist  Anesthesia Plan Comments:         Anesthesia Quick Evaluation  

## 2017-04-04 NOTE — Progress Notes (Signed)
Dr. Omer JackMumaw updated on Pt status. Pt offered something for pain, but refused. Pt tearful. Breathing with UC's encouraged. Pt having difficulty coping with UC's

## 2017-04-04 NOTE — MAU Provider Note (Signed)
Lauren Mcclain is a 26 y.o. [id number] at 7246w1d who presents today for a labor evaluation. She has been here for several hours without cervical change. Patient rechecked at [phone] and she was 3.5/70/-2. This is unchanged from previous exams. I offered patient therapeutic rest as she is contracting on a regular basis. Advised patient that she would need to stay here if she got pain medication as she drove herself here. She was very tearful. She states that she does not want to stay for pain medication. She wants to stay to have a baby. Advised patient that she could stay and be rechecked again. She did not want this either. She stated she was going to leave. She left the unit in stable condition.  FHT: 120, moderate with 15x15 accels, no decels Toco: about every 2-5 mins   Thressa ShellerHeather Lisseth Brazeau 4:42 AM 04/04/17

## 2017-04-04 NOTE — MAU Note (Signed)
Nurse and Provider Mathews Robinsons(Hogan, CNM) at the bedside for SVE.  No cervical change. Pt. Gets out of bed, puts on her shorts, gathers her belongings and leaves the building.

## 2017-04-04 NOTE — MAU Note (Signed)
Pt reports contractions that started at 10pm that are every 5 mins. Pt denies LOF. Reports some bloody show. States cervix has not been examined. Reports good fetal movement.

## 2017-04-04 NOTE — Anesthesia Procedure Notes (Signed)
Epidural Patient location during procedure: OB Start time: 04/04/2017 7:57 AM End time: 04/04/2017 9:06 AM  Staffing Anesthesiologist: Chaney MallingHODIERNE, Danney Bungert Performed: anesthesiologist   Preanesthetic Checklist Completed: patient identified, site marked, pre-op evaluation, timeout performed, IV checked, risks and benefits discussed and monitors and equipment checked  Epidural Patient position: sitting Prep: DuraPrep Patient monitoring: heart rate, cardiac monitor, continuous pulse ox and blood pressure Approach: midline Location: L2-L3 Injection technique: LOR saline  Needle:  Needle type: Tuohy  Needle gauge: 17 G Needle length: 9 cm Needle insertion depth: 6 cm Catheter type: closed end flexible Catheter size: 19 Gauge Catheter at skin depth: 11 cm Test dose: negative and Other  Assessment Events: blood not aspirated, injection not painful, no injection resistance and negative IV test  Additional Notes Informed consent obtained prior to proceeding including risk of failure, 1% risk of PDPH, risk of minor discomfort and bruising.  Discussed rare but serious complications including epidural abscess, permanent nerve injury, epidural hematoma.  Discussed alternatives to epidural analgesia and patient desires to proceed.  Timeout performed pre-procedure verifying patient name, procedure, and platelet count.  Patient tolerated procedure well. Reason for block:procedure for pain

## 2017-04-05 ENCOUNTER — Telehealth: Payer: Self-pay

## 2017-04-05 LAB — CBC
HCT: 27.6 % — ABNORMAL LOW (ref 36.0–46.0)
Hemoglobin: 9.1 g/dL — ABNORMAL LOW (ref 12.0–15.0)
MCH: 28.7 pg (ref 26.0–34.0)
MCHC: 33 g/dL (ref 30.0–36.0)
MCV: 87.1 fL (ref 78.0–100.0)
PLATELETS: 134 10*3/uL — AB (ref 150–400)
RBC: 3.17 MIL/uL — AB (ref 3.87–5.11)
RDW: 14.4 % (ref 11.5–15.5)
WBC: 12.9 10*3/uL — AB (ref 4.0–10.5)

## 2017-04-05 MED ORDER — IRON 28 MG PO TABS: 1.0000 | ORAL_TABLET | Freq: Two times a day (BID) | ORAL | 2 refills | Status: DC

## 2017-04-05 MED ORDER — IBUPROFEN 600 MG PO TABS: 600.0000 mg | ORAL_TABLET | Freq: Four times a day (QID) | ORAL | 2 refills | Status: DC

## 2017-04-05 NOTE — Telephone Encounter (Signed)
Contacted pharmacy and advised of rx change to ferrous sulfate 325mg , per provider.

## 2017-04-05 NOTE — Discharge Summary (Signed)
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 yo G3P3003 at 2464w1d was admitted in Latent Labor on 04/04/2017. Patient had an uncomplicated labor course as follows:  Membrane Rupture Time/Date: 10:42 AM ,04/04/2017   Intrapartum Procedures: Episiotomy: None [1]                                         Lacerations:  Periurethral [8]  Patient had a delivery of a Viable infant. 04/04/2017  Information for the patient's newborn:  Biagio QuintHolsey, Boy Jazara [[id number]][[id number]]  Delivery Method: Vaginal, Spontaneous Delivery (Filed from Delivery Summary)    Pateint had an uncomplicated postpartum course.  She is ambulating, tolerating a regular diet, passing flatus, and urinating well. Patient is discharged home in stable condition on 04/05/17.   Physical exam  Vitals:   04/04/17 1315 04/04/17 1433 04/04/17 1921 04/05/17 0445  BP: 109/68 (!) 121/54 110/68 (!) 102/58  Pulse: 71 70 68 64  Resp: 18 18 18 18   Temp: 99.4 F (37.4 C) 99.1 F (37.3 C) 98.6 F (37 C) 98.3 F (36.8 C)  TempSrc:   Oral Oral  SpO2: 100% 100% 100%   Weight:      Height:       General: alert, cooperative and no distress Lochia: appropriate Uterine Fundus: firm Incision: N/A DVT Evaluation: No evidence of DVT seen on physical exam. No cords or calf tenderness. No significant calf/ankle edema. Labs: Lab Results  Component Value  Date   WBC 12.9 (H) 04/05/2017   HGB 9.1 (L) 04/05/2017   HCT 27.6 (L) 04/05/2017   MCV 87.1 04/05/2017   PLT 134 (L) 04/05/2017   CMP Latest Ref Rng & Units 02/14/2017  Glucose 65 - 99 mg/dL 782(N102(H)  BUN 6 - 20 mg/dL 8  Creatinine 5.620.44 - 1.301.00 mg/dL 8.65(H0.43(L)  Sodium 846135 - 962145 mmol/L 134(L)  Potassium 3.5 - 5.1 mmol/L 3.6  Chloride 101 - 111 mmol/L 108  CO2 22 - 32 mmol/L 20(L)  Calcium 8.9 - 10.3 mg/dL 8.3(L)  Total Protein 6.5 - 8.1 g/dL 6.4(L)  Total Bilirubin 0.3 - 1.2 mg/dL 0.4  Alkaline Phos 38 - 126 U/L 90  AST 15 - 41 U/L 15  ALT 14 - 54 U/L 9(L)    Discharge instruction: per After Visit Summary and "Baby and Me Booklet".  After visit  meds:  Allergies as of 04/05/2017   No Known Allergies     Medication List    TAKE these medications   ibuprofen 600 MG tablet Commonly known as:  ADVIL,MOTRIN Take 1 tablet (600 mg total) by mouth every 6 (six) hours.   VITAFOL GUMMIES 3.33-0.333-34.8 MG Chew Chew 3 tablets by mouth at bedtime.   Vitamin D (Ergocalciferol) 50000 units Caps capsule Commonly known as:  DRISDOL Take 1 capsule (50,000 Units total) by mouth every 7 (seven) days.       Diet: routine diet  Activity: Advance as tolerated. Pelvic rest for 6 weeks.   Outpatient follow up:4 weeks Follow up Appt:No future appointments. Follow up Visit:No Follow-up on file.  Postpartum contraception: BTL planned  Newborn Data: Live born female  Birth Weight: 6 lb 0.7 oz (2741 g) APGAR: 8, 9  Baby Feeding: Bottle and Breast Disposition:home with mother   04/05/2017 Roe Coombs, CNM

## 2017-04-05 NOTE — Anesthesia Postprocedure Evaluation (Signed)
Anesthesia Post Note  Patient: Lauren Mcclain  Procedure(s) Performed: * No procedures listed *     Patient location during evaluation: Mother Baby Anesthesia Type: Epidural Level of consciousness: awake Pain management: satisfactory to patient Vital Signs Assessment: post-procedure vital signs reviewed and stable Respiratory status: spontaneous breathing Cardiovascular status: stable Anesthetic complications: no    Last Vitals:  Vitals:   04/04/17 1921 04/05/17 0445  BP: 110/68 (!) 102/58  Pulse: 68 64  Resp: 18 18  Temp: 37 C 36.8 C    Last Pain:  Vitals:   04/05/17 0501  TempSrc:   PainSc: 0-No pain   Pain Goal: Patients Stated Pain Goal: 3 (04/04/17 1319)               Cephus ShellingBURGER,Carlyne Keehan

## 2017-04-05 NOTE — Clinical Social Work Maternal (Addendum)
CLINICAL SOCIAL WORK MATERNAL/CHILD NOTE  Patient Details  Name: Lauren Mcclain MRN: [id number] Date of Birth: 07/28/1991  Date:  04/05/2017  Clinical Social Worker Initiating Note:  Lauren Mcclain Date/ Time Initiated:  04/05/17/1055     Child's Name:  Lauren Mcclain   Legal Guardian:  Mother (FOB is Lauren Mcclain 08/22/1989)   Need for Interpreter:  None   Date of Referral:  04/04/17     Reason for Referral:  Late or No Prenatal Care , Current Substance Use/Substance Use During Pregnancy  (hx of THC use and limited PNC. )   Referral Source:  Central Nursery   Address:  [address] [place] [address]  Phone number:  [phone]   Household Members:  Self, Minor Children (Lauren Mcclain 01/04/13 and Lauren Mcclain 06/14/14)   Natural Supports (not living in the home):  Extended Family, Friends, Immediate Family, Spouse/significant other   Professional Supports: None   Employment: Part-time   Type of Work: House Keeper   Education:      Financial Resources:  Medicaid   Other Resources:  Food Stamps , WIC   Cultural/Religious Considerations Which May Impact Care:  Per MOB's Face Sheet, MOB is Non-Denominational  Strengths:  Ability to meet basic needs , Home prepared for child , Pediatrician chosen    Risk Factors/Current Problems:  Substance Use    Cognitive State:  Alert , Able to Concentrate , Linear Thinking , Insightful    Mood/Affect:  Anxious , Comfortable , Interested , Happy    CSW Assessment: CSW met with MOB to complete an assessment for a consult for hx of THC in pregnancy.  When CSW arrived, MOB was watching TV in bed and infant was asleep in the bassinet. MOB was inviting, polite, and appeared interested in meeting with CSW.  During the entire assessment, MOB was appropriate to responding to infant's needs and appeared comfortable.  CSW inquired about MOB's SA use and hx.  MOB acknowledged the use of marijuana  during pregnancy.  MOB reported an increase in usage during pregnancy in effort to increase MOB's appetite and to assist MOB with sleeping. Per MOB, MOB's last use of marijuana was about 1 week ago. CSW will make a CPS report to [place] CPS, and CSW will follow the infant's CDS. CSW informed MOB of the hospital's drug screen policy, and informed MOB of the two screenings for the infant; MOB was understanding.  CSW thanked MOB for being honest, and informed MOB that the infant had a positive UDS for THC. CSW informed MOB that CSW will report the results to CPS.  CSW offered MOB resources and referrals for substance abuse; however, MOB declined all information. MOB stated that MOB does not have a SA problem.  MOB had several questions about CPS investigation and CSW explained the process.  MOB denied a hx of CPS involvement and the use of other illicit substance. CSW thanked MOB for meeting with CSW, and encouraged MOB to reach out to CSW if any questions arise.   CSW reviewed safe sleep and SIDS. MOB was knowledgeable and asked appropriate questions.   MOB communicated that she has a bassinet and a car seat for the baby.  MOB stated she feels prepared to care for her infant however, is anxious about CPS involvement.  CSW validated MOB's feelings and informed MOB that CPS will contact MOB within 48-72  hours.    MOB denied any barriers to follow up appointments for MOB and infant.     CPS report made with [place] CPS worker, Lauren Mcclain.  There are no barriers to d/c and CPS will follow-with family within 48-72 hours.   CSW Plan/Description:  Child Protective Service Report , Information/Referral to Community Resources , Patient/Family Education , No Further Intervention Required/No Barriers to Discharge    Lauren D BOYD-GILYARD, LCSW 04/05/2017, 11:13 AM  

## 2017-05-30 ENCOUNTER — Encounter (HOSPITAL_COMMUNITY): Payer: Self-pay | Admitting: Emergency Medicine

## 2017-05-30 ENCOUNTER — Encounter (HOSPITAL_COMMUNITY): Payer: Self-pay

## 2017-05-30 ENCOUNTER — Emergency Department (HOSPITAL_COMMUNITY): Payer: Medicaid Other

## 2017-05-30 ENCOUNTER — Emergency Department (HOSPITAL_COMMUNITY)
Admission: EM | Admit: 2017-05-30 | Discharge: 2017-05-30 | Disposition: A | Discharge status: Home / Self Care | Payer: Medicaid Other | Source: Home / Self Care | Attending: Emergency Medicine | Admitting: Emergency Medicine

## 2017-05-30 ENCOUNTER — Emergency Department (HOSPITAL_COMMUNITY)
Admission: EM | Admit: 2017-05-30 | Discharge: 2017-05-30 | Disposition: A | Discharge status: Home / Self Care | Payer: Medicaid Other | Attending: Emergency Medicine | Admitting: Emergency Medicine

## 2017-05-30 ENCOUNTER — Ambulatory Visit (HOSPITAL_COMMUNITY)
Admission: EM | Admit: 2017-05-30 | Discharge: 2017-05-30 | Disposition: A | Discharge status: Nursing Home (Includes ICF) | Payer: Medicaid Other

## 2017-05-30 ENCOUNTER — Ambulatory Visit: Payer: Medicaid Other | Admitting: Obstetrics & Gynecology

## 2017-05-30 DIAGNOSIS — R291 Meningismus: Secondary | ICD-10-CM

## 2017-05-30 DIAGNOSIS — Z87891 Personal history of nicotine dependence: Secondary | ICD-10-CM | POA: Insufficient documentation

## 2017-05-30 DIAGNOSIS — R509 Fever, unspecified: Secondary | ICD-10-CM | POA: Insufficient documentation

## 2017-05-30 DIAGNOSIS — R51 Headache: Secondary | ICD-10-CM | POA: Insufficient documentation

## 2017-05-30 DIAGNOSIS — Z79899 Other long term (current) drug therapy: Secondary | ICD-10-CM | POA: Insufficient documentation

## 2017-05-30 DIAGNOSIS — G4489 Other headache syndrome: Secondary | ICD-10-CM

## 2017-05-30 DIAGNOSIS — R519 Headache, unspecified: Secondary | ICD-10-CM

## 2017-05-30 LAB — COMPREHENSIVE METABOLIC PANEL
ALBUMIN: 3.6 g/dL (ref 3.5–5.0)
ALK PHOS: 55 U/L (ref 38–126)
ALT: 13 U/L — ABNORMAL LOW (ref 14–54)
ANION GAP: 11 (ref 5–15)
AST: 14 U/L — ABNORMAL LOW (ref 15–41)
BILIRUBIN TOTAL: 1.1 mg/dL (ref 0.3–1.2)
BUN: 22 mg/dL — ABNORMAL HIGH (ref 6–20)
CALCIUM: 9 mg/dL (ref 8.9–10.3)
CO2: 24 mmol/L (ref 22–32)
Chloride: 100 mmol/L — ABNORMAL LOW (ref 101–111)
Creatinine, Ser: 1.08 mg/dL — ABNORMAL HIGH (ref 0.44–1.00)
GLUCOSE: 124 mg/dL — AB (ref 65–99)
POTASSIUM: 3.7 mmol/L (ref 3.5–5.1)
Sodium: 135 mmol/L (ref 135–145)
TOTAL PROTEIN: 8 g/dL (ref 6.5–8.1)

## 2017-05-30 LAB — CBC WITH DIFFERENTIAL/PLATELET
Basophils Absolute: 0 10*3/uL (ref 0.0–0.1)
Basophils Relative: 0 %
EOS ABS: 0 10*3/uL (ref 0.0–0.7)
EOS PCT: 0 %
HCT: 39.5 % (ref 36.0–46.0)
HEMOGLOBIN: 13.6 g/dL (ref 12.0–15.0)
LYMPHS ABS: 1.2 10*3/uL (ref 0.7–4.0)
Lymphocytes Relative: 8 %
MCH: 29 pg (ref 26.0–34.0)
MCHC: 34.4 g/dL (ref 30.0–36.0)
MCV: 84.2 fL (ref 78.0–100.0)
MONOS PCT: 6 %
Monocytes Absolute: 0.9 10*3/uL (ref 0.1–1.0)
NEUTROS PCT: 86 %
Neutro Abs: 12.7 10*3/uL — ABNORMAL HIGH (ref 1.7–7.7)
Platelets: 174 10*3/uL (ref 150–400)
RBC: 4.69 MIL/uL (ref 3.87–5.11)
RDW: 15.9 % — ABNORMAL HIGH (ref 11.5–15.5)
WBC: 14.8 10*3/uL — ABNORMAL HIGH (ref 4.0–10.5)

## 2017-05-30 LAB — I-STAT CG4 LACTIC ACID, ED: Lactic Acid, Venous: 1.43 mmol/L (ref 0.5–1.9)

## 2017-05-30 MED ORDER — ONDANSETRON HCL 4 MG/2ML IJ SOLN
4.0000 mg | Freq: Once | INTRAMUSCULAR | Status: AC
  Administered 2017-05-30: 4 mg via INTRAVENOUS
  Filled 2017-05-30: qty 2

## 2017-05-30 MED ORDER — SODIUM CHLORIDE 0.9 % IV BOLUS (SEPSIS)
1000.0000 mL | Freq: Once | INTRAVENOUS | Status: AC
  Administered 2017-05-30: 1000 mL via INTRAVENOUS

## 2017-05-30 MED ORDER — PROCHLORPERAZINE EDISYLATE 5 MG/ML IJ SOLN
10.0000 mg | Freq: Once | INTRAMUSCULAR | Status: AC
  Administered 2017-05-30: 10 mg via INTRAVENOUS
  Filled 2017-05-30: qty 2

## 2017-05-30 MED ORDER — MORPHINE SULFATE (PF) 4 MG/ML IV SOLN: 4.0000 mg | Freq: Once | INTRAVENOUS | Status: DC

## 2017-05-30 MED ORDER — KETOROLAC TROMETHAMINE 30 MG/ML IJ SOLN
30.0000 mg | Freq: Once | INTRAMUSCULAR | Status: AC
  Administered 2017-05-30: 30 mg via INTRAVENOUS
  Filled 2017-05-30: qty 1

## 2017-05-30 MED ORDER — DEXAMETHASONE SODIUM PHOSPHATE 10 MG/ML IJ SOLN
10.0000 mg | Freq: Once | INTRAMUSCULAR | Status: AC
  Administered 2017-05-30: 10 mg via INTRAVENOUS
  Filled 2017-05-30: qty 1

## 2017-05-30 MED ORDER — DIPHENHYDRAMINE HCL 50 MG/ML IJ SOLN
25.0000 mg | Freq: Once | INTRAMUSCULAR | Status: AC
  Administered 2017-05-30: 25 mg via INTRAVENOUS
  Filled 2017-05-30: qty 1

## 2017-05-30 NOTE — ED Triage Notes (Signed)
Per Pt, Pt is coming from US with complaints of headache, back pain, neck stiffness x 4 days. Pt reports one episode of vomiting and one episode of fever during the four days. Pt was sent here by UC to rule out meningitis.

## 2017-05-30 NOTE — ED Triage Notes (Signed)
Per EMS: Pt has had NV, headache x 4 days.  States that NV has resolved but still has headache.  Pt spent entire ride here on the phone and when she got off the phone, began moaning.

## 2017-05-30 NOTE — ED Provider Notes (Signed)
  [facility]   [id number] 05/30/17 Arrival Time: 1613  ASSESSMENT & PLAN:  1. Nuchal rigidity   2. Other headache syndrome     No orders of the defined types were placed in this encounter.  Recommend she go to ED for higher level of care.  Reviewed expectations re: course of current medical issues. Questions answered. Outlined signs and symptoms indicating need for more acute intervention. Patient verbalized understanding. After Visit Summary given.   SUBJECTIVE:  Lauren Mcclain is a 26 y.o. female who presents with complaint of severe headache and severe neck and back pain.  She c/o having viral uri sx's and she is having worse headache of her life.  She is teary in the interview.  ROS: As per HPI.   OBJECTIVE:  Vitals:   05/30/17 1643  BP: 120/80  Pulse: 99  Temp: 98.6 F (37 C)  TempSrc: Oral  SpO2: 98%     General appearance: In pain HEENT: normocephalic; atraumatic; conjunctivae normal; TMs normal; nasal mucosa normal; oral mucosa normal Neck: nuchal rigidity Lungs: clear to auscultation bilaterally Heart: regular rate and rhythm Abdomen: soft, non-tender; bowel sounds normal; no masses or organomegaly; no guarding or rebound tenderness Back: tender cervical and thoracic spine Extremities: no cyanosis or edema; symmetrical with no gross deformities Skin: warm and dry Neurologic: normal symmetric reflexes; normal gait     Labs Reviewed - No data to display  No results found.  No Known Allergies  PMHx, SurgHx, SocialHx, Medications, and Allergies were reviewed in the Visit Navigator and updated as appropriate.      Deatra CanterOxford, William J, FNP 05/30/17 1700

## 2017-05-30 NOTE — ED Notes (Signed)
Pt LWBS after triage d/t wait.

## 2017-05-30 NOTE — Discharge Instructions (Signed)
Go to ED

## 2017-05-30 NOTE — ED Triage Notes (Addendum)
Pt came here from the ED and is complaining of headache, that shoots down her neck and into her back.  Pt answering phone while being triaged.  She reports a fever of 102 yesterday and N&V 2 days ago.  She states she took 4 Aleve at 0700 this morning.

## 2017-05-30 NOTE — ED Notes (Signed)
Declined W/C at D/C and was escorted to lobby by RN. 

## 2017-05-30 NOTE — ED Provider Notes (Addendum)
Medical screening examination/treatment/procedure(s) were conducted as a shared visit with non-physician practitioner(s) and myself.  I personally evaluated the patient during the encounter.  26 year old female with 4 days of symptoms. Patient states that she had a headache starting 4 days ago and then 3 days ago had one fever of 102 she took Tylenol has not had a fever since then. She's had some back pain and muscle aches since that time. No other symptoms. She's had some neck pain. But the rest of her head is without pain. Hasn't taken anything else at home for the symptoms. [facility] and left without being seen. She then presented to urgent care who told her to come here to get worked up for meningitis. On my exam patient is afebrile with normal vital signs. She is moving her head freely without any palpable midline neck pain. She has tenderness at the occipital area. Her neuro exam is normal. Is no Kernig or Brudzinski sign.  She's not had a fever in over 2 days. I discussed that doing a lumbar puncture was appropriate way to diagnose meningitis. I told her I did not think bacterial meningitis was likely however she could have a viral meningitis which would be diagnosed off of cerebrospinal fluid. She did not want to have a lumbar puncture done just wanted help with her headache.  Her headache improved with a headache cocktail. Her sister is a Engineer, civil (consulting)nurse and will help watch over her and bring her back if any worsening symptoms or anything changes.     Ennis Heavner, Barbara CowerJason, MD 06/02/17 [phone]

## 2017-05-30 NOTE — ED Notes (Signed)
Pt went to Xray. They will bring pt to room when done.

## 2017-06-01 NOTE — ED Provider Notes (Signed)
[facility] Provider Note   CSN: [id number] Arrival date & time: 05/30/17  1710     History   Chief Complaint Chief Complaint  Patient presents with  . Headache  . Fever    HPI Lauren Mcclain is a 26 y.o. female with a chief complaint of constant, worsening, pressure-like HA x4 days. She describes the HA s posterior, near the based of the skull that radiated down the posterior neck and into the upper back. She reports an associated fever of 102 for one day that occurred three days ago prior to resolving after taking children's Tylenol and myalgias. She states at first she felt like she had the flu. No sick contacts.   She presented to UC earlier today and was sent to the ED for further evaluation and work up.   No pertinent PMH. No daily medications. She recently gave birth in 03/2017 and had an epidural, but has not had regular HAs since giving birth.   The history is provided by the patient. No language interpreter was used.    Past Medical History:  Diagnosis Date  . Chlamydia   . Trichomonas contact, treated     Patient Active Problem List   Diagnosis Date Noted  . Normal labor 04/04/2017  . Low vitamin D level 11/30/2016  . Mild tetrahydrocannabinol (THC) abuse 11/30/2016  . no 11/24/2016    Past Surgical History:  Procedure Laterality Date  . NO PAST SURGERIES      OB History    Gravida Para Term Preterm AB Living   3 3 3     3    SAB TAB Ectopic Multiple Live Births         0 3       Home Medications    Prior to Admission medications   Medication Sig Start Date End Date Taking? Authorizing Provider  acetaminophen (TYLENOL) 160 MG/5ML suspension Take 320-640 mg by mouth every 6 (six) hours as needed (for pain or fever).   Yes [provider]  ibuprofen (ADVIL,MOTRIN) 200 MG tablet Take 200-600 mg by mouth every 6 (six) hours as needed (for pain or fever).   Yes [provider]  naproxen sodium (ANAPROX) 220 MG tablet Take  440-880 mg by mouth daily as needed (for pain).   Yes [provider]  Prenatal Vit-Fe Phos-FA-Omega (VITAFOL GUMMIES) 3.33-0.333-34.8 MG CHEW Chew 3 tablets by mouth at bedtime. Patient taking differently: Chew 1-3 tablets by mouth at bedtime.  11/24/16  Yes Denney, Rodell Perna, CNM    Family History Family History  Problem Relation Age of Onset  . Drug abuse Mother   . Drug abuse Father     Social History Social History  Substance Use Topics  . Smoking status: Former Smoker    Years: 7.00    Types: Cigars    Quit date: 05/05/2013  . Smokeless tobacco: Never Used     Comment: black and mild; 1/d  . Alcohol use No     Comment: Not currently     Allergies   Patient has no known allergies.   Review of Systems Review of Systems  Constitutional: Positive for fever (resolved). Negative for activity change and chills.  HENT: Positive for ear pain. Negative for congestion, facial swelling and sore throat.   Eyes: Positive for photophobia.  Respiratory: Negative for shortness of breath.   Cardiovascular: Negative for chest pain.  Gastrointestinal: Negative for abdominal pain, diarrhea, nausea and vomiting.  Musculoskeletal: Positive for arthralgias, back pain,  myalgias, neck pain and neck stiffness. Negative for gait problem.  Skin: Negative for rash.  Allergic/Immunologic: Negative for immunocompromised state.  Neurological: Positive for headaches. Negative for dizziness, weakness, light-headedness and numbness.     Physical Exam Updated Vital Signs BP (!) 105/55   Pulse 92   Temp 99.3 F (37.4 C) (Oral)   Resp (!) 23   Ht 5\' 9"  (1.753 m)   Wt 70.3 kg (155 lb)   LMP 05/16/2017 (Approximate)   SpO2 100%   BMI 22.89 kg/m   Physical Exam  Constitutional: She is oriented to person, place, and time. No distress.  HENT:  Head: Normocephalic and atraumatic.  Right Ear: A middle ear effusion is present.  Left Ear: A middle ear effusion is present.  Nose: Right  sinus exhibits frontal sinus tenderness. Right sinus exhibits no maxillary sinus tenderness. Left sinus exhibits frontal sinus tenderness. Left sinus exhibits no maxillary sinus tenderness.  Mouth/Throat: Uvula is midline, oropharynx is clear and moist and mucous membranes are normal.  Eyes: Conjunctivae are normal.  Neck: Neck supple.  No nuchal rigidity. Decreased flexion of the neck secondary to pain. Full ROM of the next with extension, rotation, and lateral flexion.  Cardiovascular: Normal rate, regular rhythm, normal heart sounds and intact distal pulses.  Exam reveals no gallop and no friction rub.   No murmur heard. Pulmonary/Chest: Effort normal. No respiratory distress. She has no wheezes. She has no rales. She exhibits no tenderness.  Abdominal: Soft. Bowel sounds are normal. She exhibits no distension. There is no tenderness. There is no rebound and no guarding.  Musculoskeletal: Normal range of motion. She exhibits tenderness. She exhibits no edema or deformity.  TTP of the posterior neck and right trapezius. No crepitus or step offs. Negative Kernig and Brudzinski.   Neurological: She is alert and oriented to person, place, and time.  Cranial nerves 2-12 intact. Finger-to-nose is normal. 5/5 motor strength of the bilateral upper and lower extremities. Moves all four extremities. Negative Romberg. Ambulatory without difficulty. NVI.    Skin: Skin is warm. No rash noted. She is not diaphoretic.  No overlying erythema, edema, warmth, or ecchymosis.   Psychiatric: Her behavior is normal.  Nursing note and vitals reviewed.    ED Treatments / Results  Labs (all labs ordered are listed, but only abnormal results are displayed) Labs Reviewed  COMPREHENSIVE METABOLIC PANEL - Abnormal; Notable for the following:       Result Value   Chloride 100 (*)    Glucose, Bld 124 (*)    BUN 22 (*)    Creatinine, Ser 1.08 (*)    AST 14 (*)    ALT 13 (*)    All other components within normal  limits  CBC WITH DIFFERENTIAL/PLATELET - Abnormal; Notable for the following:    WBC 14.8 (*)    RDW 15.9 (*)    Neutro Abs 12.7 (*)    All other components within normal limits  I-STAT CG4 LACTIC ACID, ED    EKG  EKG Interpretation None       Radiology Dg Chest 2 View  Result Date: 05/30/2017 CLINICAL DATA:  Fever EXAM: CHEST  2 VIEW COMPARISON:  None. FINDINGS: Lungs are clear. Heart size and pulmonary vascularity are normal. No adenopathy. No bone lesions. IMPRESSION: No abnormality noted. Electronically Signed   By: Bretta Bang III M.D.   On: 05/30/2017 18:05    Procedures Procedures (including critical care time)  Medications Ordered in ED Medications  sodium chloride 0.9 % bolus 1,000 mL (0 mLs Intravenous Stopped 05/30/17 2048)  ondansetron (ZOFRAN) injection 4 mg (4 mg Intravenous Given 05/30/17 1930)  ketorolac (TORADOL) 30 MG/ML injection 30 mg (30 mg Intravenous Given 05/30/17 1930)  prochlorperazine (COMPAZINE) injection 10 mg (10 mg Intravenous Given 05/30/17 2054)  diphenhydrAMINE (BENADRYL) injection 25 mg (25 mg Intravenous Given 05/30/17 2051)  dexamethasone (DECADRON) injection 10 mg (10 mg Intravenous Given 05/30/17 2151)  sodium chloride 0.9 % bolus 1,000 mL (0 mLs Intravenous Stopped 05/30/17 2232)     Initial Impression / Assessment and Plan / ED Course  I have reviewed the triage vital signs and the nursing notes.  Pertinent labs & imaging results that were available during my care of the patient were reviewed by me and considered in my medical decision making (see chart for details).     26 year old female presenting with constant, worsening HA x4 days with fever of 102 for one day (~48 hours ago) that has since resolved and myalgias. She stated she initially felt like she had the flu. No focal deficits on neuro exam. Toradol and zofran and fluids given with minimal improvement of HA. Discussed performing an LP with the patient who was insistent she  did not want the procedure performed. Discussed that bacterial meningitis was unlikely given her physical exam and history. The patient was also seen and evaluated by Dr. Clayborne DanaMesner, attending physician. Low suspicion for sinusitis, otitis media, SAH, ICH. Considered migraine and occipital neuralgia. Possibly viral meningitis. Discussed and evaluated the patient with Dr. Clayborne DanaMesner, attending physician. The patient's primary concern is treating her HA at this time. Will order decadron, compazine, and bendraryl cocktail and reassess for improvement. Patient care transferred to Dr. Clayborne DanaMesner at the end of my shift. Patient presentation, ED course, and plan of care discussed with review of all pertinent labs and imaging. Please see his/her note for further details regarding further ED course and disposition.   Final Clinical Impressions(s) / ED Diagnoses   Final diagnoses:  Fever, unspecified fever cause  Acute nonintractable headache, unspecified headache type    New Prescriptions Discharge Medication List as of 05/30/2017 10:14 PM       Barkley BoardsMcDonald, Ashyah Quizon A, PA-C 06/01/17 0050    Mesner, Barbara CowerJason, MD 06/02/17 1624

## 2017-06-12 ENCOUNTER — Encounter (HOSPITAL_COMMUNITY): Payer: Self-pay

## 2017-06-12 ENCOUNTER — Emergency Department (HOSPITAL_COMMUNITY)
Admission: EM | Admit: 2017-06-12 | Discharge: 2017-06-12 | Disposition: A | Discharge status: Home / Self Care | Payer: Medicaid Other | Attending: Emergency Medicine | Admitting: Emergency Medicine

## 2017-06-12 DIAGNOSIS — R21 Rash and other nonspecific skin eruption: Secondary | ICD-10-CM

## 2017-06-12 DIAGNOSIS — D72829 Elevated white blood cell count, unspecified: Secondary | ICD-10-CM | POA: Insufficient documentation

## 2017-06-12 DIAGNOSIS — Z79899 Other long term (current) drug therapy: Secondary | ICD-10-CM | POA: Diagnosis not present

## 2017-06-12 DIAGNOSIS — F17290 Nicotine dependence, other tobacco product, uncomplicated: Secondary | ICD-10-CM | POA: Diagnosis not present

## 2017-06-12 DIAGNOSIS — R509 Fever, unspecified: Secondary | ICD-10-CM | POA: Insufficient documentation

## 2017-06-12 DIAGNOSIS — R519 Headache, unspecified: Secondary | ICD-10-CM

## 2017-06-12 DIAGNOSIS — R51 Headache: Secondary | ICD-10-CM | POA: Insufficient documentation

## 2017-06-12 LAB — COMPREHENSIVE METABOLIC PANEL
ALK PHOS: 55 U/L (ref 38–126)
ALT: 11 U/L — AB (ref 14–54)
ANION GAP: 8 (ref 5–15)
AST: 11 U/L — ABNORMAL LOW (ref 15–41)
Albumin: 2.7 g/dL — ABNORMAL LOW (ref 3.5–5.0)
BUN: 5 mg/dL — ABNORMAL LOW (ref 6–20)
CALCIUM: 8.8 mg/dL — AB (ref 8.9–10.3)
CO2: 24 mmol/L (ref 22–32)
CREATININE: 0.86 mg/dL (ref 0.44–1.00)
Chloride: 106 mmol/L (ref 101–111)
Glucose, Bld: 108 mg/dL — ABNORMAL HIGH (ref 65–99)
Potassium: 3.8 mmol/L (ref 3.5–5.1)
SODIUM: 138 mmol/L (ref 135–145)
TOTAL PROTEIN: 7 g/dL (ref 6.5–8.1)
Total Bilirubin: 0.7 mg/dL (ref 0.3–1.2)

## 2017-06-12 LAB — URINALYSIS, ROUTINE W REFLEX MICROSCOPIC
Bilirubin Urine: NEGATIVE
GLUCOSE, UA: NEGATIVE mg/dL
HGB URINE DIPSTICK: NEGATIVE
KETONES UR: NEGATIVE mg/dL
Leukocytes, UA: NEGATIVE
NITRITE: NEGATIVE
Protein, ur: 30 mg/dL — AB
Specific Gravity, Urine: 1.015 (ref 1.005–1.030)
pH: 6 (ref 5.0–8.0)

## 2017-06-12 LAB — CBC WITH DIFFERENTIAL/PLATELET
BASOS ABS: 0 10*3/uL (ref 0.0–0.1)
BASOS PCT: 0 %
EOS ABS: 0 10*3/uL (ref 0.0–0.7)
Eosinophils Relative: 0 %
HCT: 33.6 % — ABNORMAL LOW (ref 36.0–46.0)
HEMOGLOBIN: 11.1 g/dL — AB (ref 12.0–15.0)
LYMPHS PCT: 7 %
Lymphs Abs: 2 10*3/uL (ref 0.7–4.0)
MCH: 28.8 pg (ref 26.0–34.0)
MCHC: 33 g/dL (ref 30.0–36.0)
MCV: 87 fL (ref 78.0–100.0)
MONO ABS: 0.9 10*3/uL (ref 0.1–1.0)
Monocytes Relative: 3 %
NEUTROS PCT: 90 %
Neutro Abs: 26 10*3/uL — ABNORMAL HIGH (ref 1.7–7.7)
PLATELETS: 371 10*3/uL (ref 150–400)
RBC: 3.86 MIL/uL — AB (ref 3.87–5.11)
RDW: 17 % — ABNORMAL HIGH (ref 11.5–15.5)
WBC: 28.9 10*3/uL — AB (ref 4.0–10.5)

## 2017-06-12 LAB — I-STAT CG4 LACTIC ACID, ED
Lactic Acid, Venous: 0.85 mmol/L (ref 0.5–1.9)
Lactic Acid, Venous: 1.63 mmol/L (ref 0.5–1.9)

## 2017-06-12 MED ORDER — DOXYCYCLINE HYCLATE 100 MG PO CAPS: 100.0000 mg | ORAL_CAPSULE | Freq: Two times a day (BID) | ORAL | 0 refills | Status: DC

## 2017-06-12 NOTE — ED Notes (Signed)
Pt afebrile at this time. States she has been running fever every other day and has been having headaches. States she has never had headaches like this.  No issues moving neck.

## 2017-06-12 NOTE — ED Triage Notes (Signed)
Pt presents to the ed with complaints of fever and rash.  States that she was diagnosed with viral meningitis two weeks ago and now has a fever that comes and goes and a rash on her legs. edp notified about pt.

## 2017-06-12 NOTE — ED Provider Notes (Signed)
[facility]EMER[facility]ENCY DEPT Provider Note   CSN: [id number] Arrival date & time: 06/12/17  1052  History   Chief Complaint Chief Complaint  Patient presents with  . Rash  . Fever    HPI Lauren Mcclain is a 26 y.o. female.  Presented with 2 weeks of periodic headaches associated with fever as well on bilateral lower extremity rash. She's has a past medical history that is negative for significant illness other than a recent clinical diagnosis of viral meningitis. Patient states that since discharge on August 14 for possible viral meningitis she has had recurrent headaches associated with fever to relieved by Tylenol and/or Advil. She had no nuchal rigidity, photophobia, visual disturbances, muscle aches, diarrhea, constipation, or visual changes. Patient attested to joint pain and rash, as well as generalized weakness and pain in her right foot and knee when walking. Patient said that she presented today due to frustration secondarily to failed resolution of her symptoms. In addition she confirms elbow pain of the right arm. She denied worsening of her symptoms, recurrence of her symptoms prior to her ED visit on August 14.      Past Medical History:  Diagnosis Date  . Chlamydia   . Trichomonas contact, treated     Patient Active Problem List   Diagnosis Date Noted  . Normal labor 04/04/2017  . Low vitamin D level 11/30/2016  . Mild tetrahydrocannabinol (THC) abuse 11/30/2016  . no 11/24/2016    Past Surgical History:  Procedure Laterality Date  . NO PAST SURGERIES      OB History    Gravida Para Term Preterm AB Living   3 3 3     3    SAB TAB Ectopic Multiple Live Births         0 3       Home Medications    Prior to Admission medications   Medication Sig Start Date End Date Taking? Authorizing Provider  acetaminophen (TYLENOL) 160 MG/5ML suspension Take 320-640 mg by mouth every 6 (six) hours as needed (for pain or fever).    [provider]  doxycycline  (VIBRAMYCIN) 100 MG capsule Take 1 capsule (100 mg total) by mouth 2 (two) times daily. 06/12/17   Lanelle Bal, MD  ibuprofen (ADVIL,MOTRIN) 200 MG tablet Take 200-600 mg by mouth every 6 (six) hours as needed (for pain or fever).    [provider]  naproxen sodium (ANAPROX) 220 MG tablet Take 440-880 mg by mouth daily as needed (for pain).    [provider]  Prenatal Vit-Fe Phos-FA-Omega (VITAFOL GUMMIES) 3.33-0.333-34.8 MG CHEW Chew 3 tablets by mouth at bedtime. Patient taking differently: Chew 1-3 tablets by mouth at bedtime.  11/24/16   Roe Coombs, CNM    Family History Family History  Problem Relation Age of Onset  . Drug abuse Mother   . Drug abuse Father     Social History Social History  Substance Use Topics  . Smoking status: Former Smoker    Years: 7.00    Types: Cigars    Quit date: 05/05/2013  . Smokeless tobacco: Never Used     Comment: black and mild; 1/d  . Alcohol use No     Comment: Not currently     Allergies   Patient has no known allergies.   Review of Systems Review of Systems  Constitutional: Positive for fever (Occurs with her headache). Negative for chills.  HENT: Negative for ear pain and sore throat.   Eyes: Negative for pain  and visual disturbance.  Respiratory: Negative for cough and shortness of breath.   Cardiovascular: Negative for chest pain and palpitations.  Gastrointestinal: Negative for abdominal pain and vomiting.  Genitourinary: Negative for dysuria and hematuria.  Musculoskeletal: Negative for arthralgias, back pain, gait problem, joint swelling, myalgias, neck pain and neck stiffness.       Joint Pain as in HPI  Skin: Positive for rash. Negative for color change.  Neurological: Positive for headaches (Associated with fever). Negative for seizures and syncope.  Psychiatric/Behavioral: The patient is nervous/anxious.   All other systems reviewed and are negative.  Physical Exam Updated Vital  Signs BP 110/77   Pulse 97   Temp 98.6 F (37 C) (Oral)   Resp 16   LMP 05/16/2017 (Approximate)   SpO2 100%   Physical Exam  Constitutional: She appears well-developed and well-nourished. No distress.  HENT:  Head: Normocephalic and atraumatic.  Eyes: Conjunctivae are normal.  Neck: Neck supple.  Cardiovascular: Normal rate and regular rhythm.   No murmur heard. Pulmonary/Chest: Effort normal and breath sounds normal. No respiratory distress.  Abdominal: Soft. There is no tenderness.  Musculoskeletal: She exhibits no edema or tenderness.  Neurological: She is alert.  Skin: Skin is warm and dry. Capillary refill takes less than 2 seconds. Rash (Multiple small randomly distributed petichia, bilateral lower extremities) noted.  Psychiatric: She has a normal mood and affect. Her behavior is normal. Judgment normal.  Nursing note and vitals reviewed.   ED Treatments / Results  Labs (all labs ordered are listed, but only abnormal results are displayed) Labs Reviewed  COMPREHENSIVE METABOLIC PANEL - Abnormal; Notable for the following:       Result Value   Glucose, Bld 108 (*)    BUN 5 (*)    Calcium 8.8 (*)    Albumin 2.7 (*)    AST 11 (*)    ALT 11 (*)    All other components within normal limits  CBC WITH DIFFERENTIAL/PLATELET - Abnormal; Notable for the following:    WBC 28.9 (*)    RBC 3.86 (*)    Hemoglobin 11.1 (*)    HCT 33.6 (*)    RDW 17.0 (*)    Neutro Abs 26.0 (*)    All other components within normal limits  URINALYSIS, ROUTINE W REFLEX MICROSCOPIC - Abnormal; Notable for the following:    APPearance HAZY (*)    Protein, ur 30 (*)    Bacteria, UA RARE (*)    Squamous Epithelial / LPF TOO NUMEROUS TO COUNT (*)    All other components within normal limits  CULTURE, BLOOD (ROUTINE X 2)  CULTURE, BLOOD (ROUTINE X 2)  ROCKY MTN SPOTTED FVR ABS PNL(IGG+IGM)  B. BURGDORFI ANTIBODIES  I-STAT CG4 LACTIC ACID, ED  I-STAT CG4 LACTIC ACID, ED    EKG  EKG  Interpretation None       Radiology No results found.  Procedures Procedures (including critical care time)  Medications Ordered in ED Medications - No data to display   Initial Impression / Assessment and Plan / ED Course  I have reviewed the triage vital signs and the nursing notes.  Pertinent labs & imaging results that were available during my care of the patient were reviewed by me and considered in my medical decision making (see chart for details).    Sala Draney is a 26 year old female who presented today with recurrent headaches and fevers. She states that approximately 2 weeks prior she visited to [facility] ED and  was tentatively diagnosed with viral meningitis. She was discharged home with advice to take acetaminophen and ibuprofen as needed for headache. Her pain had resolved with a migraine cocktail as per previous ED note. Patient stated concern with new onset rash, periodic fever associated with headache. She was noted to have severe leukocytosis of 29,000 up from 14,000 two weeks prior. Patient was concerned that she also is experiences joint pain of her right knee and right elbow that inhibits her and her daily activities.  1:15 PM, Patient seen and evaluated, complains of rash, headache associated with fever, but denies other symptoms.  2:43 PM, Patient re-evaluated. Vitals remain stable. Denied Pain, fever or chills at this time.   3:20 PM, patient seen and evaluated by Dr. Crista Elliot, patient states she feels fine, however still concerned with what was causing her periodic headache, fever and rash. She was informed that given her symptoms and leukocytosis, blood cultures had been drawn. She was further advised that her symptoms were potentially the result of a tickborne illness versus incomplete resolution of her meningitis. Labs have been drawn and will be followed up either by primary care physician by the ED. She was advised to call [facility] to  schedule an appointment within 3 days, if they were unable to schedule her appointment she is to return to the ED for evaluation. She was additionally informed that if at any point her symptoms worsen, she develops ongoing fever, headache, visual changes, diaphoresis, palpitations, or other concerning symptoms she should return to the ED immediately. She was discharged with doxycycline 100 mg twice a day for 10 days with directions to the complete medication as prescribed for to discontinue if advised to do so by another physician.   Final Clinical Impressions(s) / ED Diagnoses   Final diagnoses:  Rash  Nonintractable headache, unspecified chronicity pattern, unspecified headache type  Febrile illness  Leukocytosis, unspecified type    New Prescriptions Discharge Medication List as of 06/12/2017  3:17 PM    START taking these medications   Details  doxycycline (VIBRAMYCIN) 100 MG capsule Take 1 capsule (100 mg total) by mouth 2 (two) times daily., Starting Mon 06/12/2017, Print         Lanelle Bal, MD 06/13/17 1013    Cathren Laine, MD 06/13/17 [phone]

## 2017-06-12 NOTE — Discharge Instructions (Signed)
If they are unable to schedule you an appointment within 2-3 days at [facility], please return to the ED for evaluation if your symptoms persist.   If at any time your fever should persist, or is accompanied by nausea as well as neck pain, visual disturbances, unrelenting headache, or other concerning symptoms between now and your follow-up visit with your primary care doctor, please return to the emergency department.  You have been given a prescription for doxycycline 100mg  twice daily for 10 days. Please continue to take this until it runs out or you are told to stop by another physician.   Thank you for your visit to the [facility] ED.

## 2017-06-12 NOTE — ED Notes (Signed)
Per edp pt can sit in lobby with basic surgical mask and to get basic labs on her.

## 2017-06-13 LAB — B. BURGDORFI ANTIBODIES

## 2017-06-14 LAB — ROCKY MTN SPOTTED FVR ABS PNL(IGG+IGM)
RMSF IGG: NEGATIVE
RMSF IgM: 0.29 index (ref 0.00–0.89)

## 2017-06-17 LAB — CULTURE, BLOOD (ROUTINE X 2)
CULTURE: NO GROWTH
CULTURE: NO GROWTH
SPECIAL REQUESTS: ADEQUATE
Special Requests: ADEQUATE

## 2017-08-27 ENCOUNTER — Inpatient Hospital Stay (HOSPITAL_COMMUNITY)
Admission: AD | Admit: 2017-08-27 | Discharge: 2017-08-28 | Disposition: A | Discharge status: Home / Self Care | Payer: Medicaid Other | Source: Ambulatory Visit | Attending: Obstetrics & Gynecology | Admitting: Obstetrics & Gynecology

## 2017-08-27 DIAGNOSIS — O23591 Infection of other part of genital tract in pregnancy, first trimester: Secondary | ICD-10-CM | POA: Insufficient documentation

## 2017-08-27 DIAGNOSIS — R102 Pelvic and perineal pain: Secondary | ICD-10-CM | POA: Insufficient documentation

## 2017-08-27 DIAGNOSIS — Z87891 Personal history of nicotine dependence: Secondary | ICD-10-CM | POA: Insufficient documentation

## 2017-08-27 DIAGNOSIS — O26899 Other specified pregnancy related conditions, unspecified trimester: Secondary | ICD-10-CM

## 2017-08-27 DIAGNOSIS — B9689 Other specified bacterial agents as the cause of diseases classified elsewhere: Secondary | ICD-10-CM | POA: Insufficient documentation

## 2017-08-27 DIAGNOSIS — N760 Acute vaginitis: Secondary | ICD-10-CM

## 2017-08-27 DIAGNOSIS — Z3A01 Less than 8 weeks gestation of pregnancy: Secondary | ICD-10-CM | POA: Insufficient documentation

## 2017-08-28 ENCOUNTER — Inpatient Hospital Stay (HOSPITAL_COMMUNITY): Payer: Medicaid Other

## 2017-08-28 ENCOUNTER — Encounter (HOSPITAL_COMMUNITY): Payer: Self-pay

## 2017-08-28 DIAGNOSIS — O26891 Other specified pregnancy related conditions, first trimester: Secondary | ICD-10-CM

## 2017-08-28 DIAGNOSIS — R102 Pelvic and perineal pain: Secondary | ICD-10-CM

## 2017-08-28 DIAGNOSIS — Z3A01 Less than 8 weeks gestation of pregnancy: Secondary | ICD-10-CM | POA: Diagnosis not present

## 2017-08-28 DIAGNOSIS — Z87891 Personal history of nicotine dependence: Secondary | ICD-10-CM | POA: Diagnosis not present

## 2017-08-28 DIAGNOSIS — B9689 Other specified bacterial agents as the cause of diseases classified elsewhere: Secondary | ICD-10-CM

## 2017-08-28 DIAGNOSIS — N760 Acute vaginitis: Secondary | ICD-10-CM

## 2017-08-28 DIAGNOSIS — O23591 Infection of other part of genital tract in pregnancy, first trimester: Secondary | ICD-10-CM | POA: Diagnosis not present

## 2017-08-28 LAB — WET PREP, GENITAL
Sperm: NONE SEEN
Trich, Wet Prep: NONE SEEN
Yeast Wet Prep HPF POC: NONE SEEN

## 2017-08-28 LAB — CBC
HEMATOCRIT: 31.1 % — AB (ref 36.0–46.0)
Hemoglobin: 10.8 g/dL — ABNORMAL LOW (ref 12.0–15.0)
MCH: 30.7 pg (ref 26.0–34.0)
MCHC: 34.7 g/dL (ref 30.0–36.0)
MCV: 88.4 fL (ref 78.0–100.0)
Platelets: 157 10*3/uL (ref 150–400)
RBC: 3.52 MIL/uL — AB (ref 3.87–5.11)
RDW: 12.6 % (ref 11.5–15.5)
WBC: 7.4 10*3/uL (ref 4.0–10.5)

## 2017-08-28 LAB — POCT PREGNANCY, URINE: PREG TEST UR: POSITIVE — AB

## 2017-08-28 LAB — URINALYSIS, ROUTINE W REFLEX MICROSCOPIC
BILIRUBIN URINE: NEGATIVE
GLUCOSE, UA: NEGATIVE mg/dL
HGB URINE DIPSTICK: NEGATIVE
Ketones, ur: NEGATIVE mg/dL
Leukocytes, UA: NEGATIVE
Nitrite: NEGATIVE
Protein, ur: NEGATIVE mg/dL
SPECIFIC GRAVITY, URINE: 1.021 (ref 1.005–1.030)
pH: 6 (ref 5.0–8.0)

## 2017-08-28 LAB — HCG, QUANTITATIVE, PREGNANCY: HCG, BETA CHAIN, QUANT, S: 55120 m[IU]/mL — AB (ref <5)

## 2017-08-28 LAB — HIV ANTIBODY (ROUTINE TESTING W REFLEX): HIV Screen 4th Generation wRfx: NONREACTIVE

## 2017-08-28 MED ORDER — METRONIDAZOLE 500 MG PO TABS: 500.0000 mg | ORAL_TABLET | Freq: Two times a day (BID) | ORAL | 0 refills | Status: DC

## 2017-08-28 NOTE — Discharge Instructions (Signed)
First Trimester of Pregnancy °The first trimester of pregnancy is from week 1 until the end of week 13 (months 1 through 3). During this time, your baby will begin to develop inside you. At 6-8 weeks, the eyes and face are formed, and the heartbeat can be seen on ultrasound. At the end of 12 weeks, all the baby's organs are formed. Prenatal care is all the medical care you receive before the birth of your baby. Make sure you get good prenatal care and follow all of your doctor's instructions. °Follow these instructions at home: °Medicines °· Take over-the-counter and prescription medicines only as told by your doctor. Some medicines are safe and some medicines are not safe during pregnancy. °· Take a prenatal vitamin that contains at least 600 micrograms (mcg) of folic acid. °· If you have trouble pooping (constipation), take medicine that will make your stool soft (stool softener) if your doctor approves. °Eating and drinking °· Eat regular, healthy meals. °· Your doctor will tell you the amount of weight gain that is right for you. °· Avoid raw meat and uncooked cheese. °· If you feel sick to your stomach (nauseous) or throw up (vomit): °? Eat 4 or 5 small meals a day instead of 3 large meals. °? Try eating a few soda crackers. °? Drink liquids between meals instead of during meals. °· To prevent constipation: °? Eat foods that are high in fiber, like fresh fruits and vegetables, whole grains, and beans. °? Drink enough fluids to keep your pee (urine) clear or pale yellow. °Activity °· Exercise only as told by your doctor. Stop exercising if you have cramps or pain in your lower belly (abdomen) or low back. °· Do not exercise if it is too hot, too humid, or if you are in a place of great height (high altitude). °· Try to avoid standing for long periods of time. Move your legs often if you must stand in one place for a long time. °· Avoid heavy lifting. °· Wear low-heeled shoes. Sit and stand up straight. °· You  can have sex unless your doctor tells you not to. °Relieving pain and discomfort °· Wear a good support bra if your breasts are sore. °· Take warm water baths (sitz baths) to soothe pain or discomfort caused by hemorrhoids. Use hemorrhoid cream if your doctor says it is okay. °· Rest with your legs raised if you have leg cramps or low back pain. °· If you have puffy, bulging veins (varicose veins) in your legs: °? Wear support hose or compression stockings as told by your doctor. °? Raise (elevate) your feet for 15 minutes, 3-4 times a day. °? Limit salt in your food. °Prenatal care °· Schedule your prenatal visits by the twelfth week of pregnancy. °· Write down your questions. Take them to your prenatal visits. °· Keep all your prenatal visits as told by your doctor. This is important. °Safety °· Wear your seat belt at all times when driving. °· Make a list of emergency phone numbers. The list should include numbers for family, friends, the hospital, and police and fire departments. °General instructions °· Ask your doctor for a referral to a local prenatal class. Begin classes no later than at the start of month 6 of your pregnancy. °· Ask for help if you need counseling or if you need help with nutrition. Your doctor can give you advice or tell you where to go for help. °· Do not use hot tubs, steam rooms, or   saunas. °· Do not douche or use tampons or scented sanitary pads. °· Do not cross your legs for long periods of time. °· Avoid all herbs and alcohol. Avoid drugs that are not approved by your doctor. °· Do not use any tobacco products, including cigarettes, chewing tobacco, and electronic cigarettes. If you need help quitting, ask your doctor. You may get counseling or other support to help you quit. °· Avoid cat litter boxes and soil used by cats. These carry germs that can cause birth defects in the baby and can cause a loss of your baby (miscarriage) or stillbirth. °· Visit your dentist. At home, brush  your teeth with a soft toothbrush. Be gentle when you floss. °Contact a doctor if: °· You are dizzy. °· You have mild cramps or pressure in your lower belly. °· You have a nagging pain in your belly area. °· You continue to feel sick to your stomach, you throw up, or you have watery poop (diarrhea). °· You have a bad smelling fluid coming from your vagina. °· You have pain when you pee (urinate). °· You have increased puffiness (swelling) in your face, hands, legs, or ankles. °Get help right away if: °· You have a fever. °· You are leaking fluid from your vagina. °· You have spotting or bleeding from your vagina. °· You have very bad belly cramping or pain. °· You gain or lose weight rapidly. °· You throw up blood. It may look like coffee grounds. °· You are around people who have German measles, fifth disease, or chickenpox. °· You have a very bad headache. °· You have shortness of breath. °· You have any kind of trauma, such as from a fall or a car accident. °Summary °· The first trimester of pregnancy is from week 1 until the end of week 13 (months 1 through 3). °· To take care of yourself and your unborn baby, you will need to eat healthy meals, take medicines only if your doctor tells you to do so, and do activities that are safe for you and your baby. °· Keep all follow-up visits as told by your doctor. This is important as your doctor will have to ensure that your baby is healthy and growing well. °This information is not intended to replace advice given to you by your health care provider. Make sure you discuss any questions you have with your health care provider. °Document Released: 03/21/2008 Document Revised: 10/11/2016 Document Reviewed: 10/11/2016 °Elsevier Interactive Patient Education © 2017 [employer] °Bacterial Vaginosis °Bacterial vaginosis is a vaginal infection that occurs when the normal balance of bacteria in the vagina is disrupted. It results from an overgrowth of certain bacteria. This  is the most common vaginal infection among women ages 15-44. °Because bacterial vaginosis increases your risk for STIs (sexually transmitted infections), getting treated can help reduce your risk for chlamydia, gonorrhea, herpes, and HIV (human immunodeficiency virus). Treatment is also important for preventing complications in pregnant women, because this condition can cause an early (premature) delivery. °What are the causes? °This condition is caused by an increase in harmful bacteria that are normally present in small amounts in the vagina. However, the reason that the condition develops is not fully understood. °What increases the risk? °The following factors may make you more likely to develop this condition: °· Having a new sexual partner or multiple sexual partners. °· Having unprotected sex. °· Douching. °· Having an intrauterine device (IUD). °· Smoking. °· Drug and alcohol abuse. °· Taking   certain antibiotic medicines. °· Being pregnant. ° °You cannot get bacterial vaginosis from toilet seats, bedding, swimming pools, or contact with objects around you. °What are the signs or symptoms? °Symptoms of this condition include: °· Grey or white vaginal discharge. The discharge can also be watery or foamy. °· A fish-like odor with discharge, especially after sexual intercourse or during menstruation. °· Itching in and around the vagina. °· Burning or pain with urination. ° °Some women with bacterial vaginosis have no signs or symptoms. °How is this diagnosed? °This condition is diagnosed based on: °· Your medical history. °· A physical exam of the vagina. °· Testing a sample of vaginal fluid under a microscope to look for a large amount of bad bacteria or abnormal cells. Your health care provider may use a cotton swab or a small wooden spatula to collect the sample. ° °How is this treated? °This condition is treated with antibiotics. These may be given as a pill, a vaginal cream, or a medicine that is put into  the vagina (suppository). If the condition comes back after treatment, a second round of antibiotics may be needed. °Follow these instructions at home: °Medicines °· Take over-the-counter and prescription medicines only as told by your health care provider. °· Take or use your antibiotic as told by your health care provider. Do not stop taking or using the antibiotic even if you start to feel better. °General instructions °· If you have a female sexual partner, tell her that you have a vaginal infection. She should see her health care provider and be treated if she has symptoms. If you have a female sexual partner, he does not need treatment. °· During treatment: °? Avoid sexual activity until you finish treatment. °? Do not douche. °? Avoid alcohol as directed by your health care provider. °? Avoid breastfeeding as directed by your health care provider. °· Drink enough water and fluids to keep your urine clear or pale yellow. °· Keep the area around your vagina and rectum clean. °? Wash the area daily with warm water. °? Wipe yourself from front to back after using the toilet. °· Keep all follow-up visits as told by your health care provider. This is important. °How is this prevented? °· Do not douche. °· Wash the outside of your vagina with warm water only. °· Use protection when having sex. This includes latex condoms and dental dams. °· Limit how many sexual partners you have. To help prevent bacterial vaginosis, it is best to have sex with just one partner (monogamous). °· Make sure you and your sexual partner are tested for STIs. °· Wear cotton or cotton-lined underwear. °· Avoid wearing tight pants and pantyhose, especially during summer. °· Limit the amount of alcohol that you drink. °· Do not use any products that contain nicotine or tobacco, such as cigarettes and e-cigarettes. If you need help quitting, ask your health care provider. °· Do not use illegal drugs. °Where to find more information: °· Centers  for Disease Control and Prevention: www.cdc.gov/std °· American Sexual Health Association (ASHA): www.ashastd.org °· U.S. Department of Health and Human Services, Office on Women's Health: www.womenshealth.gov/ or https://www.womenshealth.gov/a-z-topics/bacterial-vaginosis °Contact a health care provider if: °· Your symptoms do not improve, even after treatment. °· You have more discharge or pain when urinating. °· You have a fever. °· You have pain in your abdomen. °· You have pain during sex. °· You have vaginal bleeding between periods. °Summary °· Bacterial vaginosis is a vaginal infection that occurs when   the normal balance of bacteria in the vagina is disrupted. °· Because bacterial vaginosis increases your risk for STIs (sexually transmitted infections), getting treated can help reduce your risk for chlamydia, gonorrhea, herpes, and HIV (human immunodeficiency virus). Treatment is also important for preventing complications in pregnant women, because the condition can cause an early (premature) delivery. °· This condition is treated with antibiotic medicines. These may be given as a pill, a vaginal cream, or a medicine that is put into the vagina (suppository). °This information is not intended to replace advice given to you by your health care provider. Make sure you discuss any questions you have with your health care provider. °Document Released: 10/03/2005 Document Revised: 06/18/2016 Document Reviewed: 06/18/2016 °Elsevier Interactive Patient Education © 2017 [employer]. ° °

## 2017-08-28 NOTE — MAU Note (Signed)
Pt here with c/o nausea, fatigue, and cramping. Denies any bleeding currently.

## 2017-08-28 NOTE — MAU Provider Note (Signed)
Chief Complaint: Nausea; Abdominal Pain; and Fatigue   First Provider Initiated Contact with Patient 08/28/17 0117        SUBJECTIVE HPI: Lauren Mcclain is a 26 y.o. [id number] at 632w0d by LMP who presents to maternity admissions reporting nausea and intermittent lower pelvic cramping.  Just had a baby in June.  Was not using contraception "it scares me". She denies vaginal bleeding, vaginal itching/burning,  urinary symptoms, h/a, dizziness, n/v, or fever/chills.    Abdominal Pain  This is a new problem. The current episode started today. The onset quality is gradual. The problem occurs intermittently. The problem has been unchanged. The pain is located in the LLQ and RLQ. The quality of the pain is cramping. The abdominal pain does not radiate. Associated symptoms include nausea. Pertinent negatives include no constipation, diarrhea, dysuria, fever, headaches, myalgias or vomiting. Nothing aggravates the pain. The pain is relieved by nothing. She has tried nothing for the symptoms.    RN Note: Pt here with c/o nausea, fatigue, and cramping. Denies any bleeding currently.     Past Medical History:  Diagnosis Date  . Chlamydia   . Trichomonas contact, treated    Past Surgical History:  Procedure Laterality Date  . NO PAST SURGERIES     Social History   Socioeconomic History  . Marital status: Single    Spouse name: Not on file  . Number of children: Not on file  . Years of education: Not on file  . Highest education level: Not on file  Social Needs  . Financial resource strain: Not on file  . Food insecurity - worry: Not on file  . Food insecurity - inability: Not on file  . Transportation needs - medical: Not on file  . Transportation needs - non-medical: Not on file  Occupational History  . Not on file  Tobacco Use  . Smoking status: Former Smoker    Years: 7.00    Types: Cigars    Last attempt to quit: 05/05/2013    Years since quitting: 4.3  . Smokeless tobacco: Never  Used  . Tobacco comment: black and mild; 1/d  Substance and Sexual Activity  . Alcohol use: No    Comment: Not currently  . Drug use: Yes    Frequency: 28.0 times per week    Types: Marijuana    Comment: used last month  . Sexual activity: Yes    Birth control/protection: None  Other Topics Concern  . Not on file  Social History Narrative  . Not on file   No current facility-administered medications on file prior to encounter.    Current Outpatient Medications on File Prior to Encounter  Medication Sig Dispense Refill  . acetaminophen (TYLENOL) 160 MG/5ML suspension Take 320-640 mg by mouth every 6 (six) hours as needed (for pain or fever).    Marland Kitchen. doxycycline (VIBRAMYCIN) 100 MG capsule Take 1 capsule (100 mg total) by mouth 2 (two) times daily. 20 capsule 0  . ibuprofen (ADVIL,MOTRIN) 200 MG tablet Take 200-600 mg by mouth every 6 (six) hours as needed (for pain or fever).    . naproxen sodium (ANAPROX) 220 MG tablet Take 440-880 mg by mouth daily as needed (for pain).    . Prenatal Vit-Fe Phos-FA-Omega (VITAFOL GUMMIES) 3.33-0.333-34.8 MG CHEW Chew 3 tablets by mouth at bedtime. (Patient taking differently: Chew 1-3 tablets by mouth at bedtime. ) 90 tablet 12   No Known Allergies  I have reviewed patient's Past Medical Hx, Surgical Hx, Family  Hx, Social Hx, medications and allergies.   ROS:  Review of Systems  Constitutional: Negative for fever.  Gastrointestinal: Positive for abdominal pain and nausea. Negative for constipation, diarrhea and vomiting.  Genitourinary: Negative for dysuria.  Musculoskeletal: Negative for myalgias.  Neurological: Negative for headaches.    Other systems negative   Physical Exam  Physical Exam Patient Vitals for the past 24 hrs:  BP Temp Temp src Pulse Resp SpO2 Height Weight  08/28/17 0020 113/63 98.9 F (37.2 C) Oral 62 18 100 % 5\' 9"  (1.753 m) 141 lb (64 kg)   Constitutional: Well-developed, well-nourished female in no acute distress.   Cardiovascular: normal rate Respiratory: normal effort GI: Abd soft, non-tender. Pos BS x 4 MS: Extremities nontender, no edema, normal ROM Neurologic: Alert and oriented x 4.  GU: Neg CVAT.  PELVIC EXAM: Cervix pink, visually closed, without lesion, scant white creamy discharge, vaginal walls and external genitalia normal Bimanual exam: Cervix 0/long/high, firm, anterior, neg CMT, uterus nontender, nonenlarged, adnexa without tenderness, enlargement, or mass   LAB RESULTS Results for orders placed or performed during the hospital encounter of 08/27/17 (from the past 24 hour(s))  Urinalysis, Routine w reflex microscopic     Status: None   Collection Time: 08/28/17 12:25 AM  Result Value Ref Range   Color, Urine YELLOW YELLOW   APPearance CLEAR CLEAR   Specific Gravity, Urine 1.021 1.005 - 1.030   pH 6.0 5.0 - 8.0   Glucose, UA NEGATIVE NEGATIVE mg/dL   Hgb urine dipstick NEGATIVE NEGATIVE   Bilirubin Urine NEGATIVE NEGATIVE   Ketones, ur NEGATIVE NEGATIVE mg/dL   Protein, ur NEGATIVE NEGATIVE mg/dL   Nitrite NEGATIVE NEGATIVE   Leukocytes, UA NEGATIVE NEGATIVE  Pregnancy, urine POC     Status: Abnormal   Collection Time: 08/28/17 12:35 AM  Result Value Ref Range   Preg Test, Ur POSITIVE (A) NEGATIVE  Wet prep, genital     Status: Abnormal   Collection Time: 08/28/17  1:25 AM  Result Value Ref Range   Yeast Wet Prep HPF POC NONE SEEN NONE SEEN   Trich, Wet Prep NONE SEEN NONE SEEN   Clue Cells Wet Prep HPF POC PRESENT (A) NONE SEEN   WBC, Wet Prep HPF POC FEW (A) NONE SEEN   Sperm NONE SEEN   hCG, quantitative, pregnancy     Status: Abnormal   Collection Time: 08/28/17  1:26 AM  Result Value Ref Range   hCG, Beta Chain, Quant, S 55,120 (H) <5 mIU/mL  CBC     Status: Abnormal   Collection Time: 08/28/17  1:26 AM  Result Value Ref Range   WBC 7.4 4.0 - 10.5 K/uL   RBC 3.52 (L) 3.87 - 5.11 MIL/uL   Hemoglobin 10.8 (L) 12.0 - 15.0 g/dL   HCT 81.1 (L) 91.4 - 78.2 %   MCV  88.4 78.0 - 100.0 fL   MCH 30.7 26.0 - 34.0 pg   MCHC 34.7 30.0 - 36.0 g/dL   RDW 95.6 21.3 - 08.6 %   Platelets 157 150 - 400 K/uL    --/--/B POS (06/19 0725)  IMAGING US Ob Comp Less 14 Wks  Result Date: 08/28/2017 CLINICAL DATA:  Acute onset of pelvic cramping. EXAM: OBSTETRIC <14 WK [place] AND TRANSVAGINAL OB US TECHNIQUE: Both transabdominal and transvaginal ultrasound examinations were performed for complete evaluation of the gestation as well as the maternal uterus, adnexal regions, and pelvic cul-de-sac. Transvaginal technique was performed to assess early pregnancy. COMPARISON:  Pelvic  ultrasound performed 11/28/2016 FINDINGS: Intrauterine gestational sac: Single; visualized and normal in shape. Yolk sac:  Yes Embryo:  Yes Cardiac Activity: Yes Heart Rate: 135  bpm CRL:  8.8 mm   6 w   5 d                  US EDC: 04/18/2018 Subchorionic hemorrhage:  None visualized. Maternal uterus/adnexae: The uterus is otherwise unremarkable. The ovaries are within normal limits. The right ovary measures 3.3 x 1.9 x 2.5 cm, while the left ovary measures 2.3 x 1.1 x 1.7 cm. No suspicious adnexal masses are seen; there is no evidence for ovarian torsion. No free fluid is seen within the pelvic cul-de-sac. IMPRESSION: Single live intrauterine pregnancy noted, with a crown-rump length of 9 mm, corresponding to a gestational age of [redacted] weeks 5 days. This reflects an estimated date of delivery of April 18, 2018. Electronically Signed   By: Roanna RaiderJeffery  Chang M.D.   On: 08/28/2017 02:04   [place] Ob Transvaginal  Result Date: 08/28/2017 CLINICAL DATA:  Acute onset of pelvic cramping. EXAM: OBSTETRIC <14 WK US AND TRANSVAGINAL OB US TECHNIQUE: Both transabdominal and transvaginal ultrasound examinations were performed for complete evaluation of the gestation as well as the maternal uterus, adnexal regions, and pelvic cul-de-sac. Transvaginal technique was performed to assess early pregnancy. COMPARISON:  Pelvic ultrasound  performed 11/28/2016 FINDINGS: Intrauterine gestational sac: Single; visualized and normal in shape. Yolk sac:  Yes Embryo:  Yes Cardiac Activity: Yes Heart Rate: 135  bpm CRL:  8.8 mm   6 w   5 d                  US EDC: 04/18/2018 Subchorionic hemorrhage:  None visualized. Maternal uterus/adnexae: The uterus is otherwise unremarkable. The ovaries are within normal limits. The right ovary measures 3.3 x 1.9 x 2.5 cm, while the left ovary measures 2.3 x 1.1 x 1.7 cm. No suspicious adnexal masses are seen; there is no evidence for ovarian torsion. No free fluid is seen within the pelvic cul-de-sac. IMPRESSION: Single live intrauterine pregnancy noted, with a crown-rump length of 9 mm, corresponding to a gestational age of [redacted] weeks 5 days. This reflects an estimated date of delivery of April 18, 2018. Electronically Signed   By: Roanna RaiderJeffery  Chang M.D.   On: 08/28/2017 02:04     MAU Management/MDM: Ordered usual first trimester r/o ectopic labs.   Pelvic exam and cultures done Will check baseline Ultrasound to rule out ectopic. This bleeding/pain can represent a normal pregnancy with bleeding, spontaneous abortion or even an ectopic which can be life-threatening.  The process as listed above helps to determine which of these is present.  Reviewed results Discussed no ectopic and pregnancy is viable Encouraged to seek prenatal care  ASSESSMENT 1. Pelvic pain affecting pregnancy   2. Pelvic pain affecting pregnancy   3.    Bacterial vaginosis  PLAN Discharge home Rx Flagyl for BV Pelvic rest Followup in office for prenatal care  Pt stable at time of discharge. Encouraged to return here or to other Urgent Care/ED if she develops worsening of symptoms, increase in pain, fever, or other concerning symptoms.    Wynelle BourgeoisMarie Williams CNM, MSN Certified Nurse-Midwife 08/28/2017  1:29 AM

## 2017-08-29 LAB — GC/CHLAMYDIA PROBE AMP (~~LOC~~) NOT AT ARMC
CHLAMYDIA, DNA PROBE: NEGATIVE
NEISSERIA GONORRHEA: NEGATIVE

## 2017-10-17 NOTE — L&D Delivery Note (Addendum)
Patient is 27 y.o. [id number] 734w6d admitted for SOL. S/P AROM 7 min prior to delivery.  Delivery Note At 6:25 PM a viable female was delivered via Vaginal, Vacuum (Extractor) (Presentation: ROP).  APGAR 9, 9 .weight pending Placenta status: delivered intact, appearance of partial abruption, sent to path.  Cord: 3V  Anesthesia:  Epidural Episiotomy: None Lacerations:  None Suture Repair: None Est. Blood Loss (mL):  100 ML  Mom to postpartum.  Baby to Couplet care / Skin to Skin.  Called to room due to fetus having prolonged late decel. Upon arrival patient was complete. AROM done with clear fluid, and she was instructed to push. She pushed and brought baby down to +2 sation. During pushing with contractions, fetal HR dropped to 70s, briefly improved with contractions, but would then drop back to the 70s. Vacuum Assistance with KIWI device was applied. Baby delivered and had good tone and place on maternal abdomen for oral suctioning, drying and stimulation. Delayed cord clamping performed. Placenta delivered intact with appearnce of partial abruption. This may have been the cause of the rapid decel in fetal HR. It had 3V cord. Vaginal canal and perineum was inspected and was hemostatic. Pitocin was started and uterus massaged until bleeding slowed. Counts of sharps, instruments, and lap pads were all correct.   Garnette GunnerAaron B Thompson, MD PGY-1 6/25/20196:37 PM  OB FELLOW DELIVERY ATTESTATION  I was gloved and present for the delivery in its entirety, and I agree with the above resident's note.    VAVD done by me d/t deep prolonged decelerations to the 70s. Patient was quickly verbally consented. Fetal station was +2.  The soft vacuum soft cup was positioned over the sagittal suture 3 cm anterior to posterior fontanelle.  Pressure was then increased to 500 mmHg, and the patient was instructed to push.  Pulling was administered along the pelvic curve.  2 pulls was administered during one push, no  popoffs.  The infant was then delivered atraumatically, noted to be a viable female/female infant, Apgars of 9 and 9.  There was spontaneous placental delivery, intact with three-vessel cord. A moderate size clot was noted behind placenta, suggestive of placental abruption.  Raynelle FanningJulie P. Brainard Highfill, MD 04/10/2018, 7:08 PM

## 2017-12-11 ENCOUNTER — Encounter: Payer: Medicaid Other | Admitting: Obstetrics & Gynecology

## 2017-12-13 ENCOUNTER — Encounter (HOSPITAL_COMMUNITY): Payer: Self-pay | Admitting: *Deleted

## 2017-12-13 ENCOUNTER — Inpatient Hospital Stay (HOSPITAL_COMMUNITY)
Admission: AD | Admit: 2017-12-13 | Discharge: 2017-12-13 | Disposition: A | Discharge status: Home / Self Care | Payer: Medicaid Other | Source: Ambulatory Visit | Attending: Obstetrics & Gynecology | Admitting: Obstetrics & Gynecology

## 2017-12-13 DIAGNOSIS — O99119 Other diseases of the blood and blood-forming organs and certain disorders involving the immune mechanism complicating pregnancy, unspecified trimester: Secondary | ICD-10-CM

## 2017-12-13 DIAGNOSIS — O26892 Other specified pregnancy related conditions, second trimester: Secondary | ICD-10-CM | POA: Insufficient documentation

## 2017-12-13 DIAGNOSIS — O99112 Other diseases of the blood and blood-forming organs and certain disorders involving the immune mechanism complicating pregnancy, second trimester: Secondary | ICD-10-CM | POA: Diagnosis not present

## 2017-12-13 DIAGNOSIS — Z87891 Personal history of nicotine dependence: Secondary | ICD-10-CM | POA: Diagnosis not present

## 2017-12-13 DIAGNOSIS — M5442 Lumbago with sciatica, left side: Secondary | ICD-10-CM | POA: Diagnosis not present

## 2017-12-13 DIAGNOSIS — R1032 Left lower quadrant pain: Secondary | ICD-10-CM | POA: Diagnosis present

## 2017-12-13 DIAGNOSIS — D696 Thrombocytopenia, unspecified: Secondary | ICD-10-CM | POA: Diagnosis not present

## 2017-12-13 DIAGNOSIS — Z3A22 22 weeks gestation of pregnancy: Secondary | ICD-10-CM | POA: Insufficient documentation

## 2017-12-13 DIAGNOSIS — O09892 Supervision of other high risk pregnancies, second trimester: Secondary | ICD-10-CM | POA: Insufficient documentation

## 2017-12-13 DIAGNOSIS — O99012 Anemia complicating pregnancy, second trimester: Secondary | ICD-10-CM | POA: Diagnosis not present

## 2017-12-13 DIAGNOSIS — M5432 Sciatica, left side: Secondary | ICD-10-CM | POA: Diagnosis not present

## 2017-12-13 LAB — CBC
HEMATOCRIT: 31.4 % — AB (ref 36.0–46.0)
HEMOGLOBIN: 10.8 g/dL — AB (ref 12.0–15.0)
MCH: 31.3 pg (ref 26.0–34.0)
MCHC: 34.4 g/dL (ref 30.0–36.0)
MCV: 91 fL (ref 78.0–100.0)
Platelets: 142 10*3/uL — ABNORMAL LOW (ref 150–400)
RBC: 3.45 MIL/uL — ABNORMAL LOW (ref 3.87–5.11)
RDW: 13.5 % (ref 11.5–15.5)
WBC: 7.9 10*3/uL (ref 4.0–10.5)

## 2017-12-13 LAB — URINALYSIS, ROUTINE W REFLEX MICROSCOPIC
BILIRUBIN URINE: NEGATIVE
Glucose, UA: NEGATIVE mg/dL
HGB URINE DIPSTICK: NEGATIVE
Ketones, ur: NEGATIVE mg/dL
Leukocytes, UA: NEGATIVE
Nitrite: NEGATIVE
PROTEIN: NEGATIVE mg/dL
Specific Gravity, Urine: 1.026 (ref 1.005–1.030)
pH: 6 (ref 5.0–8.0)

## 2017-12-13 LAB — WET PREP, GENITAL
Sperm: NONE SEEN
Trich, Wet Prep: NONE SEEN
YEAST WET PREP: NONE SEEN

## 2017-12-13 MED ORDER — COMFORT FIT MATERNITY SUPP MED MISC: 1.0000 | Freq: Every day | 0 refills | Status: DC

## 2017-12-13 NOTE — MAU Provider Note (Addendum)
History     CSN: [id number]  Arrival date and time: 12/13/17 [id number]   First Provider Initiated Contact with Patient 12/13/17 1004      Chief Complaint  Patient presents with  . Abdominal Pain   G4P3003 @22 .0 wks here with LLQ pain, thigh pain, and buttock pain. Pain started 3 days ago. Describes as intermittent and shooting from abdomen into thigh and buttock, and down back pf thigh. Worse with upright positions and going up stairs. No numbness but tingles at times. Denies VB or discharge. No urinary sx. Nml BMs. No fevers. Reports +FM. Has not started care yet, just got Medicaid, planning to make appt next week.   OB History    Gravida Para Term Preterm AB Living   4 3 3     3    SAB TAB Ectopic Multiple Live Births         0 3      Past Medical History:  Diagnosis Date  . Chlamydia   . Trichomonas contact, treated     Past Surgical History:  Procedure Laterality Date  . NO PAST SURGERIES      Family History  Problem Relation Age of Onset  . Drug abuse Mother   . Drug abuse Father     Social History   Tobacco Use  . Smoking status: Former Smoker    Years: 7.00    Types: Cigars    Last attempt to quit: 05/05/2013    Years since quitting: 4.6  . Smokeless tobacco: Never Used  . Tobacco comment: black and mild; 1/d  Substance Use Topics  . Alcohol use: No    Comment: Not currently  . Drug use: Yes    Frequency: 28.0 times per week    Types: Marijuana    Allergies: No Known Allergies  Medications Prior to Admission  Medication Sig Dispense Refill Last Dose  . acetaminophen (TYLENOL) 500 MG tablet Take 1,000 mg by mouth every 6 (six) hours as needed for headache.   Past Week at Unknown time  . Prenatal Vit-Fe Phos-FA-Omega (VITAFOL GUMMIES) 3.33-0.333-34.8 MG CHEW Chew 3 tablets by mouth at bedtime. (Patient not taking: Reported on 12/13/2017) 90 tablet 12 Not Taking at Unknown time    Review of Systems  Constitutional: Negative for fever.   Gastrointestinal: Positive for abdominal pain. Negative for constipation, nausea and vomiting.  Genitourinary: Negative for dysuria, vaginal bleeding and vaginal discharge.   Physical Exam   Blood pressure (!) 112/54, pulse 93, temperature 98.6 F (37 C), temperature source Oral, resp. rate 15, height 5\' 9"  (1.753 m), weight 157 lb (71.2 kg), last menstrual period 07/24/2017, SpO2 100 %, unknown if currently breastfeeding.  Physical Exam  Nursing note and vitals reviewed. Constitutional: She is oriented to person, place, and time. She appears well-developed and well-nourished. No distress.  HENT:  Head: Normocephalic and atraumatic.  Neck: Normal range of motion.  Cardiovascular: Normal rate.  Respiratory: Effort normal. No respiratory distress.  GI: Soft. She exhibits no distension and no mass. There is no tenderness. There is no rebound and no guarding.  gravid  Genitourinary:  Genitourinary Comments: External: no lesions or erythema Vagina: rugated, pink, moist, thin white discharge Cervix closed/thick   Musculoskeletal: Normal range of motion.       Thoracic back: Normal. She exhibits no tenderness.       Lumbar back: Normal. She exhibits no tenderness.  Neurological: She is alert and oriented to person, place, and time.  Skin: Skin is warm  and dry.  Psychiatric: She has a normal mood and affect.  FHT 160  Results for orders placed or performed during the hospital encounter of 12/13/17 (from the past 24 hour(s))  Wet prep, genital     Status: Abnormal   Collection Time: 12/13/17 10:11 AM  Result Value Ref Range   Yeast Wet Prep HPF POC NONE SEEN NONE SEEN   Trich, Wet Prep NONE SEEN NONE SEEN   Clue Cells Wet Prep HPF POC PRESENT (A) NONE SEEN   WBC, Wet Prep HPF POC MANY (A) NONE SEEN   Sperm NONE SEEN   CBC     Status: Abnormal   Collection Time: 12/13/17 10:16 AM  Result Value Ref Range   WBC 7.9 4.0 - 10.5 K/uL   RBC 3.45 (L) 3.87 - 5.11 MIL/uL   Hemoglobin 10.8  (L) 12.0 - 15.0 g/dL   HCT 16.1 (L) 09.6 - 04.5 %   MCV 91.0 78.0 - 100.0 fL   MCH 31.3 26.0 - 34.0 pg   MCHC 34.4 30.0 - 36.0 g/dL   RDW 40.9 81.1 - 91.4 %   Platelets 142 (L) 150 - 400 K/uL  Urinalysis, Routine w reflex microscopic     Status: Abnormal   Collection Time: 12/13/17 10:56 AM  Result Value Ref Range   Color, Urine YELLOW YELLOW   APPearance HAZY (A) CLEAR   Specific Gravity, Urine 1.026 1.005 - 1.030   pH 6.0 5.0 - 8.0   Glucose, UA NEGATIVE NEGATIVE mg/dL   Hgb urine dipstick NEGATIVE NEGATIVE   Bilirubin Urine NEGATIVE NEGATIVE   Ketones, ur NEGATIVE NEGATIVE mg/dL   Protein, ur NEGATIVE NEGATIVE mg/dL   Nitrite NEGATIVE NEGATIVE   Leukocytes, UA NEGATIVE NEGATIVE   MAU Course  Procedures  MDM Labs ordered and reviewed. Mild thrombocytopenia and anemia noted on labs> follow up in office. Pt admits to eating ice, recommend to stop and start PNV with Fe. No evidence of UTI or PTL. Rx maternity belt for RL pain an sciatica. Stable for discharge home.  Assessment and Plan   1. [redacted] weeks gestation of pregnancy   2. Sciatica of left side   3. Thrombocytopenia affecting pregnancy (HCC)   4.      Anemia in pregnancy  Discharge home [facility] to [facility] care Pregnancy verification letter provided Start PNV Rx maternity belt PTL precautions  Allergies as of 12/13/2017   No Known Allergies     Medication List    TAKE these medications   acetaminophen 500 MG tablet Commonly known as:  TYLENOL Take 1,000 mg by mouth every 6 (six) hours as needed for headache.   COMFORT FIT MATERNITY SUPP MED Misc 1 Device by Does not apply route daily.   VITAFOL GUMMIES 3.33-0.333-34.8 MG Chew Chew 3 tablets by mouth at bedtime.      Donette Larry, CNM 12/13/2017, 12:02 PM

## 2017-12-13 NOTE — Discharge Instructions (Signed)
Abdominal Pain During Pregnancy °Belly (abdominal) pain is common during pregnancy. Most of the time, it is not a serious problem. Other times, it can be a sign that something is wrong with the pregnancy. Always tell your doctor if you have belly pain. °Follow these instructions at home: °Monitor your belly pain for any changes. The following actions may help you feel better: °· Do not have sex (intercourse) or put anything in your vagina until you feel better. °· Rest until your pain stops. °· Drink clear fluids if you feel sick to your stomach (nauseous). Do not eat solid food until you feel better. °· Only take medicine as told by your doctor. °· Keep all doctor visits as told. ° °Get help right away if: °· You are bleeding, leaking fluid, or pieces of tissue come out of your vagina. °· You have more pain or cramping. °· You keep throwing up (vomiting). °· You have pain when you pee (urinate) or have blood in your pee. °· You have a fever. °· You do not feel your baby moving as much. °· You feel very weak or feel like passing out. °· You have trouble breathing, with or without belly pain. °· You have a very bad headache and belly pain. °· You have fluid leaking from your vagina and belly pain. °· You keep having watery poop (diarrhea). °· Your belly pain does not go away after resting, or the pain gets worse. °This information is not intended to replace advice given to you by your health care provider. Make sure you discuss any questions you have with your health care provider. °Document Released: 09/21/2009 Document Revised: 05/11/2016 Document Reviewed: 05/02/2013 °Elsevier Interactive Patient Education © 2018 [employer] °Sciatica °Sciatica is pain, numbness, weakness, or tingling along your sciatic nerve. The sciatic nerve starts in the lower back and goes down the back of each leg. Sciatica happens when this nerve is pinched or has pressure put on it. Sciatica usually goes away on its own or with  treatment. Sometimes, sciatica may keep coming back (recur). °Follow these instructions at home: °Medicines °· Take over-the-counter and prescription medicines only as told by your doctor. °· Do not drive or use heavy machinery while taking prescription pain medicine. °Managing pain °· If directed, put ice on the affected area. °? Put ice in a plastic bag. °? Place a towel between your skin and the bag. °? Leave the ice on for 20 minutes, 2-3 times a day. °· After icing, apply heat to the affected area before you exercise or as often as told by your doctor. Use the heat source that your doctor tells you to use, such as a moist heat pack or a heating pad. °? Place a towel between your skin and the heat source. °? Leave the heat on for 20-30 minutes. °? Remove the heat if your skin turns bright red. This is especially important if you are unable to feel pain, heat, or cold. You may have a greater risk of getting burned. °Activity °· Return to your normal activities as told by your doctor. Ask your doctor what activities are safe for you. °? Avoid activities that make your sciatica worse. °· Take short rests during the day. Rest in a lying or standing position. This is usually better than sitting to rest. °? When you rest for a long time, do some physical activity or stretching between periods of rest. °? Avoid sitting for a long time without moving. Get up and move   around at least one time each hour. °· Exercise and stretch regularly, as told by your doctor. °· Do not lift anything that is heavier than 10 lb (4.5 kg) while you have symptoms of sciatica. °? Avoid lifting heavy things even when you do not have symptoms. °? Avoid lifting heavy things over and over. °· When you lift objects, always lift in a way that is safe for your body. To do this, you should: °? Bend your knees. °? Keep the object close to your body. °? Avoid twisting. °General instructions °· Use good posture. °? Avoid leaning forward when you are  sitting. °? Avoid hunching over when you are standing. °· Stay at a healthy weight. °· Wear comfortable shoes that support your feet. Avoid wearing high heels. °· Avoid sleeping on a mattress that is too soft or too hard. You might have less pain if you sleep on a mattress that is firm enough to support your back. °· Keep all follow-up visits as told by your doctor. This is important. °Contact a doctor if: °· You have pain that: °? Wakes you up when you are sleeping. °? Gets worse when you lie down. °? Is worse than the pain you have had in the past. °? Lasts longer than 4 weeks. °· You lose weight for without trying. °Get help right away if: °· You cannot control when you pee (urinate) or poop (have a bowel movement). °· You have weakness in any of these areas and it gets worse. °? Lower back. °? Lower belly (pelvis). °? Butt (buttocks). °? Legs. °· You have redness or swelling of your back. °· You have a burning feeling when you pee. °This information is not intended to replace advice given to you by your health care provider. Make sure you discuss any questions you have with your health care provider. °Document Released: 07/12/2008 Document Revised: 03/10/2016 Document Reviewed: 06/12/2015 °Elsevier Interactive Patient Education © 2018 [employer] ° °

## 2017-12-13 NOTE — MAU Note (Signed)
Pt reports repeated cramping in left lower abd , pain radiates down her left leg. Denies bleeding or dysuria. Has not started care yet , [facility]

## 2017-12-14 LAB — GC/CHLAMYDIA PROBE AMP (~~LOC~~) NOT AT ARMC
Chlamydia: NEGATIVE
Neisseria Gonorrhea: NEGATIVE

## 2017-12-15 ENCOUNTER — Encounter: Payer: Self-pay | Admitting: Advanced Practice Midwife

## 2017-12-15 ENCOUNTER — Ambulatory Visit (INDEPENDENT_AMBULATORY_CARE_PROVIDER_SITE_OTHER): Payer: Medicaid Other | Admitting: Advanced Practice Midwife

## 2017-12-15 DIAGNOSIS — O0932 Supervision of pregnancy with insufficient antenatal care, second trimester: Secondary | ICD-10-CM | POA: Diagnosis not present

## 2017-12-15 DIAGNOSIS — Z3480 Encounter for supervision of other normal pregnancy, unspecified trimester: Secondary | ICD-10-CM

## 2017-12-15 DIAGNOSIS — Z3482 Encounter for supervision of other normal pregnancy, second trimester: Secondary | ICD-10-CM | POA: Diagnosis not present

## 2017-12-15 DIAGNOSIS — N898 Other specified noninflammatory disorders of vagina: Secondary | ICD-10-CM

## 2017-12-15 NOTE — Progress Notes (Signed)
  Subjective:    Lauren Mcclain is being seen today for her first obstetrical visit.  This is not a planned pregnancy. She is at 4331w2d gestation. Her obstetrical history is significant for none . Relationship with FOB: he is involoved, but they are not in a relationship . Patient unsure about her intention to breast feed. Pregnancy history fully reviewed.  Patient reports no complaints.  Patient is late to care. She was unsure about keeping this pregnancy, and now she is considering adoption.   Review of Systems:   Review of Systems  All other systems reviewed and are negative.   Objective:     BP 107/67   Pulse 91   Wt 156 lb 4.8 oz (70.9 kg)   LMP 07/24/2017 (Exact Date)   BMI 23.08 kg/m  Physical Exam  Nursing note and vitals reviewed. Constitutional: She is oriented to person, place, and time. She appears well-developed and well-nourished. No distress.  HENT:  Head: Normocephalic.  Cardiovascular: Normal rate.  Respiratory: Effort normal.  GI: Soft. There is no tenderness. There is no rebound.  Genitourinary:  Genitourinary Comments:  External: no lesion Vagina: small amount of white discharge Cervix: pink, smooth, no CMT Uterus: AGA   Neurological: She is alert and oriented to person, place, and time.  Skin: Skin is warm and dry.  Psychiatric: She has a normal mood and affect.    Maternal Exam:  Abdomen: Fundal height is 22.       Fetal Exam Fetal Monitor Review: Mode: hand-held doppler probe.   Baseline rate: 152.      Assessment:    Pregnancy: [id number] Patient Active Problem List   Diagnosis Date Noted  . Supervision of other normal pregnancy, antepartum 12/15/2017  . Late prenatal care affecting pregnancy in second trimester 12/15/2017  . Low vitamin D level 11/30/2016  . Mild tetrahydrocannabinol [facility](THC) abuse 11/30/2016       Plan:     Initial labs drawn. Does not want panorama SMA, CF carrier testing today Has had hgb elec with prior  pregnancy Pap done 11/2016 and was normal, not indicated today  Prenatal vitamins. Problem list reviewed and updated. AFP3 discussed: too late to care . Role of ultrasound in pregnancy discussed; fetal survey: ordered. Amniocentesis discussed: not indicated. Follow up in 4 weeks. 50% of 45 min visit spent on counseling and coordination of care.     Thressa ShellerHeather Hogan 12/15/2017

## 2017-12-15 NOTE — Patient Instructions (Signed)

## 2017-12-15 NOTE — Progress Notes (Signed)
Patient is in the office for initial ob visit, unplanned pregnancy, pt states that fob is involved but they are not in a relationship. Pt states she previously planned to get an abortion but, changed her mind, which is why she is late to care. Pt states that she is considering adoption.

## 2017-12-16 LAB — OBSTETRIC PANEL, INCLUDING HIV
ANTIBODY SCREEN: NEGATIVE
BASOS: 0 %
Basophils Absolute: 0 10*3/uL (ref 0.0–0.2)
EOS (ABSOLUTE): 0.5 10*3/uL — ABNORMAL HIGH (ref 0.0–0.4)
EOS: 7 %
HEMOGLOBIN: 11.1 g/dL (ref 11.1–15.9)
HEP B S AG: NEGATIVE
HIV SCREEN 4TH GENERATION: NONREACTIVE
Hematocrit: 32.7 % — ABNORMAL LOW (ref 34.0–46.6)
IMMATURE GRANS (ABS): 0 10*3/uL (ref 0.0–0.1)
Immature Granulocytes: 0 %
Lymphocytes Absolute: 2.1 10*3/uL (ref 0.7–3.1)
Lymphs: 26 %
MCH: 31.3 pg (ref 26.6–33.0)
MCHC: 33.9 g/dL (ref 31.5–35.7)
MCV: 92 fL (ref 79–97)
MONOCYTES: 6 %
Monocytes Absolute: 0.5 10*3/uL (ref 0.1–0.9)
NEUTROS ABS: 4.9 10*3/uL (ref 1.4–7.0)
Neutrophils: 61 %
Platelets: 155 10*3/uL (ref 150–379)
RBC: 3.55 x10E6/uL — ABNORMAL LOW (ref 3.77–5.28)
RDW: 13.8 % (ref 12.3–15.4)
RPR: NONREACTIVE
RUBELLA: 2.58 {index} (ref >0.99)
Rh Factor: POSITIVE
WBC: 8 10*3/uL (ref 3.4–10.8)

## 2017-12-17 ENCOUNTER — Other Ambulatory Visit: Payer: Self-pay | Admitting: Advanced Practice Midwife

## 2017-12-17 DIAGNOSIS — Z3480 Encounter for supervision of other normal pregnancy, unspecified trimester: Secondary | ICD-10-CM

## 2017-12-18 ENCOUNTER — Other Ambulatory Visit: Payer: Self-pay | Admitting: Advanced Practice Midwife

## 2017-12-18 LAB — URINE CULTURE, OB REFLEX

## 2017-12-18 LAB — CERVICOVAGINAL ANCILLARY ONLY
Bacterial vaginitis: POSITIVE — AB
CANDIDA VAGINITIS: NEGATIVE
TRICH (WINDOWPATH): NEGATIVE

## 2017-12-18 LAB — CULTURE, OB URINE

## 2017-12-18 MED ORDER — METRONIDAZOLE 500 MG PO TABS: 500.0000 mg | ORAL_TABLET | Freq: Two times a day (BID) | ORAL | 0 refills | Status: DC

## 2017-12-23 ENCOUNTER — Other Ambulatory Visit: Payer: Self-pay | Admitting: Advanced Practice Midwife

## 2017-12-23 DIAGNOSIS — Z3480 Encounter for supervision of other normal pregnancy, unspecified trimester: Secondary | ICD-10-CM

## 2017-12-23 LAB — CYSTIC FIBROSIS MUTATION 97: GENE DIS ANAL CARRIER INTERP BLD/T-IMP: NOT DETECTED

## 2017-12-25 ENCOUNTER — Encounter (HOSPITAL_COMMUNITY): Payer: Self-pay | Admitting: Advanced Practice Midwife

## 2017-12-25 LAB — SMN1 COPY NUMBER ANALYSIS (SMA CARRIER SCREENING)

## 2018-01-01 ENCOUNTER — Ambulatory Visit (HOSPITAL_COMMUNITY)
Admission: RE | Admit: 2018-01-01 | Discharge: 2018-01-01 | Disposition: A | Discharge status: Home / Self Care | Payer: Medicaid Other | Source: Ambulatory Visit | Attending: Advanced Practice Midwife | Admitting: Advanced Practice Midwife

## 2018-01-01 ENCOUNTER — Ambulatory Visit (HOSPITAL_COMMUNITY): Payer: Medicaid Other

## 2018-01-01 ENCOUNTER — Other Ambulatory Visit: Payer: Self-pay | Admitting: Advanced Practice Midwife

## 2018-01-01 DIAGNOSIS — Z3A24 24 weeks gestation of pregnancy: Secondary | ICD-10-CM | POA: Diagnosis not present

## 2018-01-01 DIAGNOSIS — Z363 Encounter for antenatal screening for malformations: Secondary | ICD-10-CM

## 2018-01-01 DIAGNOSIS — Z3480 Encounter for supervision of other normal pregnancy, unspecified trimester: Secondary | ICD-10-CM

## 2018-01-12 ENCOUNTER — Encounter: Payer: Self-pay | Admitting: Advanced Practice Midwife

## 2018-01-19 ENCOUNTER — Encounter: Payer: Self-pay | Admitting: Certified Nurse Midwife

## 2018-01-19 ENCOUNTER — Ambulatory Visit (INDEPENDENT_AMBULATORY_CARE_PROVIDER_SITE_OTHER): Payer: Medicaid Other | Admitting: Certified Nurse Midwife

## 2018-01-19 VITALS — BP 122/78 | HR 90 | Wt 160.4 lb

## 2018-01-19 DIAGNOSIS — Z23 Encounter for immunization: Secondary | ICD-10-CM

## 2018-01-19 DIAGNOSIS — Z3480 Encounter for supervision of other normal pregnancy, unspecified trimester: Secondary | ICD-10-CM

## 2018-01-19 DIAGNOSIS — R7989 Other specified abnormal findings of blood chemistry: Secondary | ICD-10-CM

## 2018-01-19 DIAGNOSIS — Z3482 Encounter for supervision of other normal pregnancy, second trimester: Secondary | ICD-10-CM

## 2018-01-19 MED ORDER — VITAMIN D (ERGOCALCIFEROL) 1.25 MG (50000 UNIT) PO CAPS: 50000.0000 [IU] | ORAL_CAPSULE | ORAL | 2 refills | Status: DC

## 2018-01-19 NOTE — Progress Notes (Signed)
   PRENATAL VISIT NOTE  Subjective:  Lauren Mcclain is a 27 y.o. [phone] at 8150w2d being seen today for ongoing prenatal care.  She is currently monitored for the following issues for this low-risk pregnancy and has Low vitamin D level; Mild tetrahydrocannabinol (THC) abuse; Supervision of other normal pregnancy, antepartum; and Late prenatal care affecting pregnancy in second trimester on their problem list.  Patient reports no complaints.  Contractions: Irregular. Vag. Bleeding: None.  Movement: Present. Denies leaking of fluid.   The following portions of the patient's history were reviewed and updated as appropriate: allergies, current medications, past family history, past medical history, past social history, past surgical history and problem list. Problem list updated.  Objective:   Vitals:   01/19/18 1022  BP: 122/78  Pulse: 90  Weight: 160 lb 6.4 oz (72.8 kg)    Fetal Status: Fetal Heart Rate (bpm): 147; doppler Fundal Height: 27 cm Movement: Present     General:  Alert, oriented and cooperative. Patient is in no acute distress.  Skin: Skin is warm and dry. No rash noted.   Cardiovascular: Normal heart rate noted  Respiratory: Normal respiratory effort, no problems with respiration noted  Abdomen: Soft, gravid, appropriate for gestational age.  Pain/Pressure: Absent     Pelvic: Cervical exam deferred        Extremities: Normal range of motion.  Edema: None  Mental Status: Normal mood and affect. Normal behavior. Normal judgment and thought content.   Assessment and Plan:  Pregnancy: G4P3003 at 5550w2d  1. Supervision of other normal pregnancy, antepartum     Doing well - US MFM OB FOLLOW UP; Future  2. Low vitamin D level     Taking weekly vitamin D.   Preterm labor symptoms and general obstetric precautions including but not limited to vaginal bleeding, contractions, leaking of fluid and fetal movement were reviewed in detail with the patient. Please refer to After  Visit Summary for other counseling recommendations.  Return in about 2 weeks (around 02/02/2018) for ROB, Needs lab visit 2 hour early next week.  Future Appointments  Date Time Provider Department Center  02/02/2018  9:45 AM [facility] US 2 [facility]    Roe Coombsachelle A Karenna Romanoff, CNM

## 2018-01-19 NOTE — Patient Instructions (Signed)
Before Baby Comes Home Before your baby arrives it is important to:  Have all of the supplies that you will need to care for your baby.  Know where to go if there is an emergency.  Discuss the baby's arrival with other family members.  What supplies will I need?  It is recommended that you have the following supplies: Large Items  Crib.  Crib mattress.  Rear-facing infant car seat. If possible, have a trained professional check to make sure that it is installed correctly.  Feeding  6-8 bottles that are 4-5 oz in size.  6-8 nipples.  Bottle brush.  Sterilizer, or a large pan or kettle with a lid.  A way to boil and cool water.  If you will be breastfeeding: ? Breast pump. ? Nipple cream. ? Nursing bra. ? Breast pads. ? Breast shields.  If you will be formula feeding: ? Formula. ? Measuring cups. ? Measuring spoons.  Bathing  Mild baby soap and baby shampoo.  Petroleum jelly.  Soft cloth towel and washcloth.  Hooded towel.  Cotton balls.  Bath basin.  Other Supplies  Rectal thermometer.  Bulb syringe.  Baby wipes or washcloths for diaper changes.  Diaper bag.  Changing pad.  Clothing, including one-piece outfits and pajamas.  Baby nail clippers.  Receiving blankets.  Mattress pad and sheets for the crib.  Night-light for the baby's room.  Baby monitor.  2 or 3 pacifiers.  Either 24-36 cloth diapers and waterproof diaper covers or a box of disposable diapers. You may need to use as many as 10-12 diapers per day.  How do I prepare for an emergency? Prepare for an emergency by:  Knowing how to get to the nearest hospital.  Listing the phone numbers of your baby's health care providers near your home phone and in your cell phone.  How do I prepare my family?  Decide how to handle visitors.  If you have other children: ? Talk with them about the baby coming home. Ask them how they feel about it. ? Read a book together about  being a new big brother or sister. ? Find ways to let them help you prepare for the new baby. ? Have someone ready to care for them while you are in the hospital. This information is not intended to replace advice given to you by your health care provider. Make sure you discuss any questions you have with your health care provider. Document Released: 09/15/2008 Document Revised: 03/10/2016 Document Reviewed: 09/10/2014 Elsevier Interactive Patient Education  2018 Elsevier Inc[employer]  Braxton Hicks Contractions Contractions of the uterus can occur throughout pregnancy, but they are not always a sign that you are in labor. You may have practice contractions called Braxton Hicks contractions. These false labor contractions are sometimes confused with true labor. What are Braxton Hicks contractions? Braxton Hicks contractions are tightening movements that occur in the muscles of the uterus before labor. Unlike true labor contractions, these contractions do not result in opening (dilation) and thinning of the cervix. Toward the end of pregnancy (32-34 weeks), Braxton Hicks contractions can happen more often and may become stronger. These contractions are sometimes difficult to tell apart from true labor because they can be very uncomfortable. You should not feel embarrassed if you go to the hospital with false labor. Sometimes, the only way to tell if you are in true labor is for your health care provider to look for changes in the cervix. The health care provider will do a   physical exam and may monitor your contractions. If you are not in true labor, the exam should show that your cervix is not dilating and your water has not broken. If there are other health problems associated with your pregnancy, it is completely safe for you to be sent home with false labor. You may continue to have Braxton Hicks contractions until you go into true labor. How to tell the difference between true labor and false labor True  labor  Contractions last 30-70 seconds.  Contractions become very regular.  Discomfort is usually felt in the top of the uterus, and it spreads to the lower abdomen and low back.  Contractions do not go away with walking.  Contractions usually become more intense and increase in frequency.  The cervix dilates and gets thinner. False labor  Contractions are usually shorter and not as strong as true labor contractions.  Contractions are usually irregular.  Contractions are often felt in the front of the lower abdomen and in the groin.  Contractions may go away when you walk around or change positions while lying down.  Contractions get weaker and are shorter-lasting as time goes on.  The cervix usually does not dilate or become thin. Follow these instructions at home:  Take over-the-counter and prescription medicines only as told by your health care provider.  Keep up with your usual exercises and follow other instructions from your health care provider.  Eat and drink lightly if you think you are going into labor.  If Braxton Hicks contractions are making you uncomfortable: ? Change your position from lying down or resting to walking, or change from walking to resting. ? Sit and rest in a tub of warm water. ? Drink enough fluid to keep your urine pale yellow. Dehydration may cause these contractions. ? Do slow and deep breathing several times an hour.  Keep all follow-up prenatal visits as told by your health care provider. This is important. Contact a health care provider if:  You have a fever.  You have continuous pain in your abdomen. Get help right away if:  Your contractions become stronger, more regular, and closer together.  You have fluid leaking or gushing from your vagina.  You pass blood-tinged mucus (bloody show).  You have bleeding from your vagina.  You have low back pain that you never had before.  You feel your baby's head pushing down and  causing pelvic pressure.  Your baby is not moving inside you as much as it used to. Summary  Contractions that occur before labor are called Braxton Hicks contractions, false labor, or practice contractions.  Braxton Hicks contractions are usually shorter, weaker, farther apart, and less regular than true labor contractions. True labor contractions usually become progressively stronger and regular and they become more frequent.  Manage discomfort from Braxton Hicks contractions by changing position, resting in a warm bath, drinking plenty of water, or practicing deep breathing. This information is not intended to replace advice given to you by your health care provider. Make sure you discuss any questions you have with your health care provider. Document Released: 02/16/2017 Document Revised: 02/16/2017 Document Reviewed: 02/16/2017 Elsevier Interactive Patient Education  2018 Elsevier Inc.  Third Trimester of Pregnancy The third trimester is from week 29 through week 42, months 7 through 9. This trimester is when your unborn baby (fetus) is growing very fast. At the end of the ninth month, the unborn baby is about 20 inches in length. It weighs about 6-10 pounds. Follow   these instructions at home:  Avoid all smoking, herbs, and alcohol. Avoid drugs not approved by your doctor.  Do not use any tobacco products, including cigarettes, chewing tobacco, and electronic cigarettes. If you need help quitting, ask your doctor. You may get counseling or other support to help you quit.  Only take medicine as told by your doctor. Some medicines are safe and some are not during pregnancy.  Exercise only as told by your doctor. Stop exercising if you start having cramps.  Eat regular, healthy meals.  Wear a good support bra if your breasts are tender.  Do not use hot tubs, steam rooms, or saunas.  Wear your seat belt when driving.  Avoid raw meat, uncooked cheese, and liter boxes and soil used  by cats.  Take your prenatal vitamins.  Take 1500-2000 milligrams of calcium daily starting at the 20th week of pregnancy until you deliver your baby.  Try taking medicine that helps you poop (stool softener) as needed, and if your doctor approves. Eat more fiber by eating fresh fruit, vegetables, and whole grains. Drink enough fluids to keep your pee (urine) clear or pale yellow.  Take warm water baths (sitz baths) to soothe pain or discomfort caused by hemorrhoids. Use hemorrhoid cream if your doctor approves.  If you have puffy, bulging veins (varicose veins), wear support hose. Raise (elevate) your feet for 15 minutes, 3-4 times a day. Limit salt in your diet.  Avoid heavy lifting, wear low heels, and sit up straight.  Rest with your legs raised if you have leg cramps or low back pain.  Visit your dentist if you have not gone during your pregnancy. Use a soft toothbrush to brush your teeth. Be gentle when you floss.  You can have sex (intercourse) unless your doctor tells you not to.  Do not travel far distances unless you must. Only do so with your doctor's approval.  Take prenatal classes.  Practice driving to the hospital.  Pack your hospital bag.  Prepare the baby's room.  Go to your doctor visits. Get help if:  You are not sure if you are in labor or if your water has broken.  You are dizzy.  You have mild cramps or pressure in your lower belly (abdominal).  You have a nagging pain in your belly area.  You continue to feel sick to your stomach (nauseous), throw up (vomit), or have watery poop (diarrhea).  You have bad smelling fluid coming from your vagina.  You have pain with peeing (urination). Get help right away if:  You have a fever.  You are leaking fluid from your vagina.  You are spotting or bleeding from your vagina.  You have severe belly cramping or pain.  You lose or gain weight rapidly.  You have trouble catching your breath and have  chest pain.  You notice sudden or extreme puffiness (swelling) of your face, hands, ankles, feet, or legs.  You have not felt the baby move in over an hour.  You have severe headaches that do not go away with medicine.  You have vision changes. This information is not intended to replace advice given to you by your health care provider. Make sure you discuss any questions you have with your health care provider. Document Released: 12/28/2009 Document Revised: 03/10/2016 Document Reviewed: 12/04/2012 Elsevier Interactive Patient Education  2017 Elsevier Inc.  

## 2018-01-23 ENCOUNTER — Other Ambulatory Visit: Payer: Medicaid Other

## 2018-01-25 ENCOUNTER — Ambulatory Visit (HOSPITAL_COMMUNITY)
Admission: EM | Admit: 2018-01-25 | Discharge: 2018-01-25 | Disposition: A | Discharge status: Home / Self Care | Payer: Medicaid Other
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
## 2018-01-26 ENCOUNTER — Other Ambulatory Visit: Payer: Self-pay

## 2018-01-26 ENCOUNTER — Encounter (HOSPITAL_COMMUNITY): Payer: Self-pay | Admitting: Emergency Medicine

## 2018-01-26 ENCOUNTER — Emergency Department (HOSPITAL_COMMUNITY)
Admission: EM | Admit: 2018-01-26 | Discharge: 2018-01-27 | Disposition: A | Discharge status: Home / Self Care | Payer: Medicaid Other | Attending: Emergency Medicine | Admitting: Emergency Medicine

## 2018-01-26 DIAGNOSIS — Z5321 Procedure and treatment not carried out due to patient leaving prior to being seen by health care provider: Secondary | ICD-10-CM | POA: Insufficient documentation

## 2018-01-26 DIAGNOSIS — M549 Dorsalgia, unspecified: Secondary | ICD-10-CM | POA: Insufficient documentation
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
## 2018-01-26 NOTE — ED Notes (Signed)
Pt speaking with charge nurse, security and [employer] at bedside. Pt verbally aggressive with staff

## 2018-01-26 NOTE — ED Notes (Signed)
No answer in waiting room 

## 2018-01-27 NOTE — ED Notes (Signed)
Per boyfriend pt left and he is going to stay

## 2018-01-27 NOTE — ED Notes (Signed)
Pt returned and states they decided they are not leaving since they waited so long

## 2018-01-27 NOTE — ED Notes (Signed)
Pt very loud in waiting area regarding wait times after advising pt she would go to a room next, pt observed leaving the ED with partner, pt rolled her eyes at the RN and stated "What the f**k you looking at"

## 2018-02-01 ENCOUNTER — Encounter: Payer: Medicaid Other | Admitting: Certified Nurse Midwife

## 2018-02-01 ENCOUNTER — Other Ambulatory Visit: Payer: Medicaid Other

## 2018-02-02 ENCOUNTER — Ambulatory Visit (HOSPITAL_COMMUNITY): Payer: Medicaid Other | Attending: Certified Nurse Midwife

## 2018-02-05 ENCOUNTER — Encounter: Payer: Medicaid Other | Admitting: Certified Nurse Midwife

## 2018-02-05 ENCOUNTER — Other Ambulatory Visit: Payer: Medicaid Other

## 2018-02-06 ENCOUNTER — Encounter: Payer: Self-pay | Admitting: Certified Nurse Midwife

## 2018-02-06 ENCOUNTER — Other Ambulatory Visit: Payer: Medicaid Other

## 2018-02-06 ENCOUNTER — Ambulatory Visit (INDEPENDENT_AMBULATORY_CARE_PROVIDER_SITE_OTHER): Payer: Medicaid Other | Admitting: Certified Nurse Midwife

## 2018-02-06 ENCOUNTER — Other Ambulatory Visit: Payer: Self-pay

## 2018-02-06 VITALS — BP 112/62 | HR 91 | Wt 160.0 lb

## 2018-02-06 DIAGNOSIS — Z3480 Encounter for supervision of other normal pregnancy, unspecified trimester: Secondary | ICD-10-CM

## 2018-02-06 DIAGNOSIS — R7989 Other specified abnormal findings of blood chemistry: Secondary | ICD-10-CM

## 2018-02-06 NOTE — Progress Notes (Signed)
   PRENATAL VISIT NOTE  Subjective:  Lauren Mcclain is a 27 y.o. [phone] at [redacted]w[redacted]d being seen today for ongoing prenatal care.  She is currently monitored for the following issues for this low-risk pregnancy and has Low vitamin D level; Mild tetrahydrocannabinol (THC) abuse; Supervision of other normal pregnancy, antepartum; and Late prenatal care affecting pregnancy in second trimester on their problem list.  Patient reports no complaints.  Contractions: Irritability. Vag. Bleeding: None.  Movement: Present. Denies leaking of fluid.   The following portions of the patient's history were reviewed and updated as appropriate: allergies, current medications, past family history, past medical history, past social history, past surgical history and problem list. Problem list updated.  Objective:   Vitals:   02/06/18 0948  BP: 112/62  Pulse: 91  Weight: 160 lb (72.6 kg)    Fetal Status: Fetal Heart Rate (bpm): 152; doppler Fundal Height: 29 cm Movement: Present     General:  Alert, oriented and cooperative. Patient is in no acute distress.  Skin: Skin is warm and dry. No rash noted.   Cardiovascular: Normal heart rate noted  Respiratory: Normal respiratory effort, no problems with respiration noted  Abdomen: Soft, gravid, appropriate for gestational age.  Pain/Pressure: Absent     Pelvic: Cervical exam deferred        Extremities: Normal range of motion.  Edema: None  Mental Status: Normal mood and affect. Normal behavior. Normal judgment and thought content.   Assessment and Plan:  Pregnancy: G4P3003 at 482w6d  1. Supervision of other normal pregnancy, antepartum     Doing well.  F/U US previously ordered.  - Glucose Tolerance, 2 Hours w/1 Hour - CBC - HIV antibody (with reflex) - RPR  2. Low vitamin D level     Taking weekly vitamin D  Preterm labor symptoms and general obstetric precautions including but not limited to vaginal bleeding, contractions, leaking of fluid and fetal  movement were reviewed in detail with the patient. Please refer to After Visit Summary for other counseling recommendations.  Return in about 2 weeks (around 02/20/2018) for ROB.  No future appointments.  Roe Coombsachelle A Vladislav Axelson, CNM

## 2018-02-06 NOTE — Progress Notes (Signed)
ROB/GTT. 

## 2018-02-07 ENCOUNTER — Other Ambulatory Visit: Payer: Self-pay | Admitting: Certified Nurse Midwife

## 2018-02-07 DIAGNOSIS — O99019 Anemia complicating pregnancy, unspecified trimester: Secondary | ICD-10-CM | POA: Insufficient documentation

## 2018-02-07 DIAGNOSIS — Z3480 Encounter for supervision of other normal pregnancy, unspecified trimester: Secondary | ICD-10-CM

## 2018-02-07 DIAGNOSIS — O99013 Anemia complicating pregnancy, third trimester: Secondary | ICD-10-CM

## 2018-02-07 LAB — CBC
HEMATOCRIT: 32.9 % — AB (ref 34.0–46.6)
HEMOGLOBIN: 10.4 g/dL — AB (ref 11.1–15.9)
MCH: 29 pg (ref 26.6–33.0)
MCHC: 31.6 g/dL (ref 31.5–35.7)
MCV: 92 fL (ref 79–97)
Platelets: 163 10*3/uL (ref 150–379)
RBC: 3.59 x10E6/uL — ABNORMAL LOW (ref 3.77–5.28)
RDW: 13.3 % (ref 12.3–15.4)
WBC: 8 10*3/uL (ref 3.4–10.8)

## 2018-02-07 LAB — HIV ANTIBODY (ROUTINE TESTING W REFLEX): HIV Screen 4th Generation wRfx: NONREACTIVE

## 2018-02-07 LAB — GLUCOSE TOLERANCE, 2 HOURS W/ 1HR
GLUCOSE, 2 HOUR: 98 mg/dL (ref 65–152)
Glucose, 1 hour: 155 mg/dL (ref 65–179)
Glucose, Fasting: 83 mg/dL (ref 65–91)

## 2018-02-07 LAB — RPR: RPR: NONREACTIVE

## 2018-02-07 MED ORDER — CITRANATAL BLOOM 90-1 MG PO TABS: 1.0000 | ORAL_TABLET | Freq: Every day | ORAL | 12 refills | Status: DC

## 2018-03-06 ENCOUNTER — Encounter: Payer: Self-pay | Admitting: Certified Nurse Midwife

## 2018-03-06 ENCOUNTER — Ambulatory Visit (INDEPENDENT_AMBULATORY_CARE_PROVIDER_SITE_OTHER): Payer: Medicaid Other | Admitting: Certified Nurse Midwife

## 2018-03-06 VITALS — BP 119/65 | HR 93 | Wt 164.0 lb

## 2018-03-06 DIAGNOSIS — O99013 Anemia complicating pregnancy, third trimester: Secondary | ICD-10-CM

## 2018-03-06 DIAGNOSIS — Z3483 Encounter for supervision of other normal pregnancy, third trimester: Secondary | ICD-10-CM

## 2018-03-06 DIAGNOSIS — R7989 Other specified abnormal findings of blood chemistry: Secondary | ICD-10-CM

## 2018-03-06 DIAGNOSIS — Z3480 Encounter for supervision of other normal pregnancy, unspecified trimester: Secondary | ICD-10-CM

## 2018-03-06 MED ORDER — CITRANATAL BLOOM 90-1 MG PO TABS: 1.0000 | ORAL_TABLET | Freq: Every day | ORAL | 12 refills | Status: DC

## 2018-03-06 NOTE — Progress Notes (Signed)
   PRENATAL VISIT NOTE  Subjective:  Lauren Mcclain is a 27 y.o. [phone] at [redacted]w[redacted]d being seen today for ongoing prenatal care.  She is currently monitored for the following issues for this low-risk pregnancy and has Low vitamin D level; Mild tetrahydrocannabinol (THC) abuse; Supervision of other normal pregnancy, antepartum; Late prenatal care affecting pregnancy in second trimester; and Anemia in pregnancy on their problem list.  Patient reports no complaints.  Contractions: Irregular. Vag. Bleeding: None.  Movement: Present. Denies leaking of fluid.   The following portions of the patient's history were reviewed and updated as appropriate: allergies, current medications, past family history, past medical history, past social history, past surgical history and problem list. Problem list updated.  Objective:   Vitals:   03/06/18 1322  BP: 119/65  Pulse: 93  Weight: 164 lb (74.4 kg)    Fetal Status: Fetal Heart Rate (bpm): 145; doppler Fundal Height: 32 cm Movement: Present     General:  Alert, oriented and cooperative. Patient is in no acute distress.  Skin: Skin is warm and dry. No rash noted.   Cardiovascular: Normal heart rate noted  Respiratory: Normal respiratory effort, no problems with respiration noted  Abdomen: Soft, gravid, appropriate for gestational age.  Pain/Pressure: Present     Pelvic: Cervical exam deferred        Extremities: Normal range of motion.     Mental Status: Normal mood and affect. Normal behavior. Normal judgment and thought content.   Assessment and Plan:  Pregnancy: G4P3003 at [redacted]w[redacted]d  1. Supervision of other normal pregnancy, antepartum     Doing well.  [place] rescheduled.  Hx of breech presentation this pregnancy  2. Low vitamin D level     Taking weekly vitamin D  3. Anemia during pregnancy in third trimester     HgB: 10.4 at 28 wks.  - Prenatal-DSS-FeCb-FeGl-FA (CITRANATAL BLOOM) 90-1 MG TABS; Take 1 tablet by mouth daily.  Dispense: 30 tablet;  Refill: 12  Preterm labor symptoms and general obstetric precautions including but not limited to vaginal bleeding, contractions, leaking of fluid and fetal movement were reviewed in detail with the patient. Please refer to After Visit Summary for other counseling recommendations.  Return in about 2 weeks (around 03/20/2018) for ROB, GBS.  Future Appointments  Date Time Provider Department Center  03/19/2018 11:30 AM [facility] [place] 1 [facility]    Roe Coombs, CNM

## 2018-03-07 NOTE — Addendum Note (Signed)
Addended by: Marya Landry D on: 03/07/2018 04:28 PM   Modules accepted: Orders

## 2018-03-19 ENCOUNTER — Other Ambulatory Visit: Payer: Self-pay | Admitting: Certified Nurse Midwife

## 2018-03-19 ENCOUNTER — Ambulatory Visit (INDEPENDENT_AMBULATORY_CARE_PROVIDER_SITE_OTHER): Payer: Medicaid Other | Admitting: Certified Nurse Midwife

## 2018-03-19 ENCOUNTER — Ambulatory Visit (HOSPITAL_COMMUNITY)
Admission: RE | Admit: 2018-03-19 | Discharge: 2018-03-19 | Disposition: A | Discharge status: Home / Self Care | Payer: Medicaid Other | Source: Ambulatory Visit | Attending: Certified Nurse Midwife | Admitting: Certified Nurse Midwife

## 2018-03-19 ENCOUNTER — Encounter: Payer: Self-pay | Admitting: Certified Nurse Midwife

## 2018-03-19 ENCOUNTER — Other Ambulatory Visit: Payer: Self-pay

## 2018-03-19 ENCOUNTER — Other Ambulatory Visit (HOSPITAL_COMMUNITY)
Admission: RE | Admit: 2018-03-19 | Discharge: 2018-03-19 | Disposition: A | Discharge status: Home / Self Care | Payer: Medicaid Other | Source: Ambulatory Visit | Attending: Certified Nurse Midwife | Admitting: Certified Nurse Midwife

## 2018-03-19 VITALS — BP 111/69 | HR 76 | Wt 162.0 lb

## 2018-03-19 DIAGNOSIS — Z362 Encounter for other antenatal screening follow-up: Secondary | ICD-10-CM | POA: Diagnosis not present

## 2018-03-19 DIAGNOSIS — Z3A35 35 weeks gestation of pregnancy: Secondary | ICD-10-CM | POA: Diagnosis not present

## 2018-03-19 DIAGNOSIS — Z3480 Encounter for supervision of other normal pregnancy, unspecified trimester: Secondary | ICD-10-CM

## 2018-03-19 DIAGNOSIS — R7989 Other specified abnormal findings of blood chemistry: Secondary | ICD-10-CM

## 2018-03-19 LAB — OB RESULTS CONSOLE GBS: GBS: NEGATIVE

## 2018-03-19 LAB — OB RESULTS CONSOLE GC/CHLAMYDIA: GC PROBE AMP, GENITAL: NEGATIVE

## 2018-03-19 NOTE — Progress Notes (Signed)
ROB GBS 

## 2018-03-19 NOTE — Progress Notes (Signed)
   PRENATAL VISIT NOTE  Subjective:  Lauren Mcclain is a 27 y.o. [id number] at 9358w5d being seen today for ongoing prenatal care.  She is currently monitored for the following issues for this low-risk pregnancy and has Low vitamin D level; Mild tetrahydrocannabinol (THC) abuse; Supervision of other normal pregnancy, antepartum; Late prenatal care affecting pregnancy in second trimester; and Anemia in pregnancy on their problem list.  Patient reports no complaints.  Contractions: Irritability. Vag. Bleeding: None.  Movement: Present. Denies leaking of fluid.   The following portions of the patient's history were reviewed and updated as appropriate: allergies, current medications, past family history, past medical history, past social history, past surgical history and problem list. Problem list updated.  Objective:   Vitals:   03/19/18 1025  BP: 111/69  Pulse: 76  Weight: 162 lb (73.5 kg)    Fetal Status: Fetal Heart Rate (bpm): 135-145; doppler Fundal Height: 33 cm Movement: Present  Presentation: Vertex  General:  Alert, oriented and cooperative. Patient is in no acute distress.  Skin: Skin is warm and dry. No rash noted.   Cardiovascular: Normal heart rate noted  Respiratory: Normal respiratory effort, no problems with respiration noted  Abdomen: Soft, gravid, appropriate for gestational age.  Pain/Pressure: Present     Pelvic: Cervical exam performed Dilation: Closed Effacement (%): Thick Station: -3  Extremities: Normal range of motion.  Edema: None  Mental Status: Normal mood and affect. Normal behavior. Normal judgment and thought content.   Assessment and Plan:  Pregnancy: G4P3003 at 3858w5d  1. Supervision of other normal pregnancy, antepartum    Doing well.  Has f/u anatomy scan scheduled for today 03/19/18.  - Cervicovaginal ancillary only - Culture, beta strep (group b only)  2. Low vitamin D level    Taking weekly vitamin D.  Preterm labor symptoms and general obstetric  precautions including but not limited to vaginal bleeding, contractions, leaking of fluid and fetal movement were reviewed in detail with the patient. Please refer to After Visit Summary for other counseling recommendations.  Return in about 1 week (around 03/26/2018) for ROB.  Future Appointments  Date Time Provider Department Center  03/19/2018 11:30 AM [facility] US 1 [facility]    Roe Coombsachelle A Tyreik Delahoussaye, CNM

## 2018-03-20 ENCOUNTER — Other Ambulatory Visit: Payer: Self-pay | Admitting: Certified Nurse Midwife

## 2018-03-20 ENCOUNTER — Encounter: Payer: Medicaid Other | Admitting: Certified Nurse Midwife

## 2018-03-20 DIAGNOSIS — Z3480 Encounter for supervision of other normal pregnancy, unspecified trimester: Secondary | ICD-10-CM

## 2018-03-20 LAB — CERVICOVAGINAL ANCILLARY ONLY
CHLAMYDIA, DNA PROBE: NEGATIVE
Neisseria Gonorrhea: NEGATIVE

## 2018-03-22 ENCOUNTER — Inpatient Hospital Stay (HOSPITAL_COMMUNITY)
Admission: AD | Admit: 2018-03-22 | Discharge: 2018-03-22 | Disposition: A | Discharge status: Home / Self Care | Payer: Medicaid Other | Source: Ambulatory Visit | Attending: Obstetrics & Gynecology | Admitting: Obstetrics & Gynecology

## 2018-03-22 ENCOUNTER — Encounter (HOSPITAL_COMMUNITY): Payer: Self-pay | Admitting: *Deleted

## 2018-03-22 DIAGNOSIS — Z813 Family history of other psychoactive substance abuse and dependence: Secondary | ICD-10-CM | POA: Diagnosis not present

## 2018-03-22 DIAGNOSIS — O4703 False labor before 37 completed weeks of gestation, third trimester: Secondary | ICD-10-CM | POA: Insufficient documentation

## 2018-03-22 DIAGNOSIS — Z79899 Other long term (current) drug therapy: Secondary | ICD-10-CM | POA: Insufficient documentation

## 2018-03-22 DIAGNOSIS — Z3689 Encounter for other specified antenatal screening: Secondary | ICD-10-CM

## 2018-03-22 DIAGNOSIS — Z3A36 36 weeks gestation of pregnancy: Secondary | ICD-10-CM | POA: Diagnosis not present

## 2018-03-22 DIAGNOSIS — O479 False labor, unspecified: Secondary | ICD-10-CM

## 2018-03-22 DIAGNOSIS — Z87891 Personal history of nicotine dependence: Secondary | ICD-10-CM | POA: Diagnosis not present

## 2018-03-22 DIAGNOSIS — O4693 Antepartum hemorrhage, unspecified, third trimester: Secondary | ICD-10-CM | POA: Diagnosis present

## 2018-03-22 NOTE — MAU Provider Note (Signed)
History     CSN: [id number]  Arrival date and time: 03/22/18 1018   First Provider Initiated Contact with Patient 03/22/18 1059      Chief Complaint  Patient presents with  . Vaginal Bleeding   HPI  Lauren Mcclain is a 27 y.o. [phone] at 25168w1d who presents with vaginal bleeding. Noted pink/red mucoid discharge this morning when she wiped after voiding. Bleeding has not continued. Has had some braxton hicks irregularly contractions for the last few days. Cervix was checked at her prenatal visit on Monday; was closed. Denies LOF, vaginal discharge, abdominal pain, or recent intercourse. Positive fetal movement.   OB History    Gravida  4   Para  3   Term  3   Preterm      AB      Living  3     SAB      TAB      Ectopic      Multiple  0   Live Births  3           Past Medical History:  Diagnosis Date  . Chlamydia   . Trichomonas contact, treated     Past Surgical History:  Procedure Laterality Date  . NO PAST SURGERIES      Family History  Problem Relation Age of Onset  . Drug abuse Mother   . Drug abuse Father     Social History   Tobacco Use  . Smoking status: Former Smoker    Years: 7.00    Types: Cigars    Last attempt to quit: 05/05/2013    Years since quitting: 4.8  . Smokeless tobacco: Never Used  . Tobacco comment: black and mild; 1/d  Substance Use Topics  . Alcohol use: No    Comment: Not currently  . Drug use: Not Currently    Frequency: 28.0 times per week    Types: Marijuana    Allergies: No Known Allergies  Medications Prior to Admission  Medication Sig Dispense Refill Last Dose  . acetaminophen (TYLENOL) 500 MG tablet Take 1,000 mg by mouth every 6 (six) hours as needed for headache.   Not Taking  . Elastic Bandages & Supports (COMFORT FIT MATERNITY SUPP MED) MISC 1 Device by Does not apply route daily. (Patient not taking: Reported on 12/15/2017) 1 each 0 Not Taking  . Prenatal Vit-Fe Phos-FA-Omega (VITAFOL GUMMIES)  3.33-0.333-34.8 MG CHEW Chew 3 tablets by mouth at bedtime. 90 tablet 12 Taking  . Prenatal-DSS-FeCb-FeGl-FA (CITRANATAL BLOOM) 90-1 MG TABS Take 1 tablet by mouth daily. 30 tablet 12   . Vitamin D, Ergocalciferol, (DRISDOL) 50000 units CAPS capsule Take 1 capsule (50,000 Units total) by mouth every 7 (seven) days. 30 capsule 2     Review of Systems  Constitutional: Negative.   Gastrointestinal: Negative.   Genitourinary: Positive for vaginal bleeding. Negative for vaginal discharge.   Physical Exam   Blood pressure (!) 111/56, pulse 82, temperature 98.5 F (36.9 C), temperature source Oral, resp. rate 16, height 5\' 8"  (1.727 m), weight 162 lb (73.5 kg), last menstrual period 07/24/2017, SpO2 100 %, unknown if currently breastfeeding.  Physical Exam  Nursing note and vitals reviewed. Constitutional: She is oriented to person, place, and time. She appears well-developed and well-nourished. No distress.  HENT:  Head: Normocephalic and atraumatic.  Eyes: Conjunctivae are normal. Right eye exhibits no discharge. Left eye exhibits no discharge. No scleral icterus.  Neck: Normal range of motion.  Respiratory: Effort normal. No respiratory  distress.  GI: Soft. There is no tenderness.  Genitourinary: No bleeding in the vagina.  Genitourinary Comments: Dilation: 1 Effacement (%): 40 Station: -3 Presentation: Vertex Exam by:: Estanislado Spire NP   Neurological: She is alert and oriented to person, place, and time.  Skin: Skin is warm and dry. She is not diaphoretic.  Psychiatric: She has a normal mood and affect. Her behavior is normal. Judgment and thought content normal.    MAU Course  Procedures No results found for this or any previous visit (from the past 24 hour(s)).  MDM NST:  Baseline: 135 bpm, Variability: Good {> 6 bpm), Accelerations: Reactive and Decelerations: Absent  Cervix 1/40/-3. No blood noted on glove. Bloody show likely due to contractions and cervical change since  Monday. Pt reassured.   Assessment and Plan  A: 1. Braxton Hicks contractions   2. NST (non-stress test) reactive   3. [redacted] weeks gestation of pregnancy    P: Discharge home Labor precautions Bleeding precautions Keep f/u with OB  Judeth Horn 03/22/2018, 10:59 AM

## 2018-03-22 NOTE — Discharge Instructions (Signed)
Braxton Hicks Contractions Contractions of the uterus can occur throughout pregnancy, but they are not always a sign that you are in labor. You may have practice contractions called Braxton Hicks contractions. These false labor contractions are sometimes confused with true labor. What are Lauren Mcclain contractions? Braxton Hicks contractions are tightening movements that occur in the muscles of the uterus before labor. Unlike true labor contractions, these contractions do not result in opening (dilation) and thinning of the cervix. Toward the end of pregnancy (32-34 weeks), Braxton Hicks contractions can happen more often and may become stronger. These contractions are sometimes difficult to tell apart from true labor because they can be very uncomfortable. You should not feel embarrassed if you go to the hospital with false labor. Sometimes, the only way to tell if you are in true labor is for your health care provider to look for changes in the cervix. The health care provider will do a physical exam and may monitor your contractions. If you are not in true labor, the exam should show that your cervix is not dilating and your water has not broken. If there are other health problems associated with your pregnancy, it is completely safe for you to be sent home with false labor. You may continue to have Braxton Hicks contractions until you go into true labor. How to tell the difference between true labor and false labor True labor  Contractions last 30-70 seconds.  Contractions become very regular.  Discomfort is usually felt in the top of the uterus, and it spreads to the lower abdomen and low back.  Contractions do not go away with walking.  Contractions usually become more intense and increase in frequency.  The cervix dilates and gets thinner. False labor  Contractions are usually shorter and not as strong as true labor contractions.  Contractions are usually irregular.  Contractions  are often felt in the front of the lower abdomen and in the groin.  Contractions may go away when you walk around or change positions while lying down.  Contractions get weaker and are shorter-lasting as time goes on.  The cervix usually does not dilate or become thin. Follow these instructions at home:  Take over-the-counter and prescription medicines only as told by your health care provider.  Keep up with your usual exercises and follow other instructions from your health care provider.  Eat and drink lightly if you think you are going into labor.  If Braxton Hicks contractions are making you uncomfortable: ? Change your position from lying down or resting to walking, or change from walking to resting. ? Sit and rest in a tub of warm water. ? Drink enough fluid to keep your urine pale yellow. Dehydration may cause these contractions. ? Do slow and deep breathing several times an hour.  Keep all follow-up prenatal visits as told by your health care provider. This is important. Contact a health care provider if:  You have a fever.  You have continuous pain in your abdomen. Get help right away if:  Your contractions become stronger, more regular, and closer together.  You have fluid leaking or gushing from your vagina.  You have bleeding from your vagina.  You have low back pain that you never had before.  Your baby is not moving inside you as much as it used to. Summary  Contractions that occur before labor are called Braxton Hicks contractions, false labor, or practice contractions.  Braxton Hicks contractions are usually shorter, weaker, farther apart, and less  regular than true labor contractions. True labor contractions usually become progressively stronger and regular and they become more frequent.  Manage discomfort from [facility] contractions by changing position, resting in a warm bath, drinking plenty of water, or practicing deep breathing. This information  is not intended to replace advice given to you by your health care provider. Make sure you discuss any questions you have with your health care provider. Document Released: 02/16/2017 Document Revised: 02/16/2017 Document Reviewed: 02/16/2017 Elsevier Interactive Patient Education  2018 Elsevier Inc.    Fetal Movement Counts Patient Name: ________________________________________________ Patient Due Date: ____________________ What is a fetal movement count? A fetal movement count is the number of times that you feel your baby move during a certain amount of time. This may also be called a fetal kick count. A fetal movement count is recommended for every pregnant woman. You may be asked to start counting fetal movements as early as week 28 of your pregnancy. Pay attention to when your baby is most active. You may notice your baby's sleep and wake cycles. You may also notice things that make your baby move more. You should do a fetal movement count:  When your baby is normally most active.  At the same time each day.  A good time to count movements is while you are resting, after having something to eat and drink. How do I count fetal movements? 1. Find a quiet, comfortable area. Sit, or lie down on your side. 2. Write down the date, the start time and stop time, and the number of movements that you felt between those two times. Take this information with you to your health care visits. 3. For 2 hours, count kicks, flutters, swishes, rolls, and jabs. You should feel at least 10 movements during 2 hours. 4. You may stop counting after you have felt 10 movements. 5. If you do not feel 10 movements in 2 hours, have something to eat and drink. Then, keep resting and counting for 1 hour. If you feel at least 4 movements during that hour, you may stop counting. Contact a health care provider if:  You feel fewer than 4 movements in 2 hours.  Your baby is not moving like he or she usually  does. Date: ____________ Start time: ____________ Stop time: ____________ Movements: ____________ Date: ____________ Start time: ____________ Stop time: ____________ Movements: ____________ Date: ____________ Start time: ____________ Stop time: ____________ Movements: ____________ Date: ____________ Start time: ____________ Stop time: ____________ Movements: ____________ Date: ____________ Start time: ____________ Stop time: ____________ Movements: ____________ Date: ____________ Start time: ____________ Stop time: ____________ Movements: ____________ Date: ____________ Start time: ____________ Stop time: ____________ Movements: ____________ Date: ____________ Start time: ____________ Stop time: ____________ Movements: ____________ Date: ____________ Start time: ____________ Stop time: ____________ Movements: ____________ This information is not intended to replace advice given to you by your health care provider. Make sure you discuss any questions you have with your health care provider. Document Released: 11/02/2006 Document Revised: 06/01/2016 Document Reviewed: 11/12/2015 Elsevier Interactive Patient Education  [employer]2018 Elsevier Inc.

## 2018-03-22 NOTE — MAU Note (Signed)
Urine in lab 

## 2018-03-22 NOTE — MAU Note (Signed)
Pt reports some blood on tissue this am , some contractions.

## 2018-03-23 ENCOUNTER — Other Ambulatory Visit: Payer: Self-pay | Admitting: Certified Nurse Midwife

## 2018-03-23 DIAGNOSIS — Z3480 Encounter for supervision of other normal pregnancy, unspecified trimester: Secondary | ICD-10-CM

## 2018-03-23 LAB — CULTURE, BETA STREP (GROUP B ONLY): Strep Gp B Culture: NEGATIVE

## 2018-03-26 ENCOUNTER — Encounter: Payer: Self-pay | Admitting: Certified Nurse Midwife

## 2018-03-26 ENCOUNTER — Ambulatory Visit (INDEPENDENT_AMBULATORY_CARE_PROVIDER_SITE_OTHER): Payer: Medicaid Other | Admitting: Certified Nurse Midwife

## 2018-03-26 VITALS — BP 111/73 | HR 98 | Wt 161.2 lb

## 2018-03-26 DIAGNOSIS — O99013 Anemia complicating pregnancy, third trimester: Secondary | ICD-10-CM

## 2018-03-26 DIAGNOSIS — R7989 Other specified abnormal findings of blood chemistry: Secondary | ICD-10-CM

## 2018-03-26 DIAGNOSIS — Z3480 Encounter for supervision of other normal pregnancy, unspecified trimester: Secondary | ICD-10-CM

## 2018-03-26 NOTE — Progress Notes (Signed)
   PRENATAL VISIT NOTE  Subjective:  Lauren Mcclain is a 27 y.o. [phone] at 561w5d being seen today for ongoing prenatal care.  She is currently monitored for the following issues for this low-risk pregnancy and has Low vitamin D level; Mild tetrahydrocannabinol (THC) abuse; Supervision of other normal pregnancy, antepartum; Late prenatal care affecting pregnancy in second trimester; and Anemia in pregnancy on their problem list.  Patient reports no complaints.  Contractions: Not present. Vag. Bleeding: None.  Movement: Present. Denies leaking of fluid.   The following portions of the patient's history were reviewed and updated as appropriate: allergies, current medications, past family history, past medical history, past social history, past surgical history and problem list. Problem list updated.  Objective:   Vitals:   03/26/18 1036  BP: 111/73  Pulse: 98  Weight: 161 lb 3.2 oz (73.1 kg)    Fetal Status: Fetal Heart Rate (bpm): 145; doppler Fundal Height: 35 cm Movement: Present     General:  Alert, oriented and cooperative. Patient is in no acute distress.  Skin: Skin is warm and dry. No rash noted.   Cardiovascular: Normal heart rate noted  Respiratory: Normal respiratory effort, no problems with respiration noted  Abdomen: Soft, gravid, appropriate for gestational age.  Pain/Pressure: Absent     Pelvic: Cervical exam deferred        Extremities: Normal range of motion.  Edema: None  Mental Status: Normal mood and affect. Normal behavior. Normal judgment and thought content.   Assessment and Plan:  Pregnancy: G4P3003 at 371w5d  1. Supervision of other normal pregnancy, antepartum     Doing well. EFW: at 35 wks was 60%, confirmed vertex on US  2. Low vitamin D level     Taking weekly vitamin D  3. Anemia during pregnancy in third trimester     Taking iron supplementation.  HGB was 10.5@28wks .   Preterm labor symptoms and general obstetric precautions including but not  limited to vaginal bleeding, contractions, leaking of fluid and fetal movement were reviewed in detail with the patient. Please refer to After Visit Summary for other counseling recommendations.  Return in about 1 week (around 04/02/2018) for ROB.  No future appointments.  Roe Coombsachelle A Denney, CNM

## 2018-03-26 NOTE — Progress Notes (Signed)
Patient reports good fetal movement, denies pain. 

## 2018-04-03 ENCOUNTER — Inpatient Hospital Stay (HOSPITAL_COMMUNITY)
Admission: AD | Admit: 2018-04-03 | Discharge: 2018-04-04 | Disposition: A | Discharge status: Home / Self Care | Payer: Medicaid Other | Source: Ambulatory Visit | Attending: Family Medicine | Admitting: Family Medicine

## 2018-04-03 ENCOUNTER — Ambulatory Visit (INDEPENDENT_AMBULATORY_CARE_PROVIDER_SITE_OTHER): Payer: Medicaid Other | Admitting: Certified Nurse Midwife

## 2018-04-03 ENCOUNTER — Encounter: Payer: Self-pay | Admitting: Certified Nurse Midwife

## 2018-04-03 ENCOUNTER — Encounter (HOSPITAL_COMMUNITY): Payer: Self-pay

## 2018-04-03 VITALS — BP 117/72 | HR 77 | Wt 164.0 lb

## 2018-04-03 DIAGNOSIS — Z3A38 38 weeks gestation of pregnancy: Secondary | ICD-10-CM | POA: Insufficient documentation

## 2018-04-03 DIAGNOSIS — Z3483 Encounter for supervision of other normal pregnancy, third trimester: Secondary | ICD-10-CM

## 2018-04-03 DIAGNOSIS — R7989 Other specified abnormal findings of blood chemistry: Secondary | ICD-10-CM

## 2018-04-03 DIAGNOSIS — Z3480 Encounter for supervision of other normal pregnancy, unspecified trimester: Secondary | ICD-10-CM

## 2018-04-03 DIAGNOSIS — O99013 Anemia complicating pregnancy, third trimester: Secondary | ICD-10-CM

## 2018-04-03 DIAGNOSIS — O479 False labor, unspecified: Secondary | ICD-10-CM

## 2018-04-03 DIAGNOSIS — Z3493 Encounter for supervision of normal pregnancy, unspecified, third trimester: Secondary | ICD-10-CM | POA: Insufficient documentation

## 2018-04-03 NOTE — MAU Note (Signed)
Pt reports contractions every 2-3 mins since 9:15. Pt denies LOF or vaginal bleeding. Reports good fetal movement. Cervix has not been examined.

## 2018-04-03 NOTE — Progress Notes (Signed)
   PRENATAL VISIT NOTE  Subjective:  Lauren Mcclain is a 27 y.o. [phone] at 5575w6d being seen today for ongoing prenatal care.  She is currently monitored for the following issues for this low-risk pregnancy and has Low vitamin D level; Mild tetrahydrocannabinol (THC) abuse; Supervision of other normal pregnancy, antepartum; Late prenatal care affecting pregnancy in second trimester; and Anemia in pregnancy on their problem list.  Patient reports no complaints.  Contractions: Not present. Vag. Bleeding: None.  Movement: Present. Denies leaking of fluid.   The following portions of the patient's history were reviewed and updated as appropriate: allergies, current medications, past family history, past medical history, past social history, past surgical history and problem list. Problem list updated.  Objective:   Vitals:   04/03/18 1100  BP: 117/72  Pulse: 77  Weight: 164 lb (74.4 kg)    Fetal Status: Fetal Heart Rate (bpm): 150; doppler Fundal Height: 35 cm Movement: Present     General:  Alert, oriented and cooperative. Patient is in no acute distress.  Skin: Skin is warm and dry. No rash noted.   Cardiovascular: Normal heart rate noted  Respiratory: Normal respiratory effort, no problems with respiration noted  Abdomen: Soft, gravid, appropriate for gestational age.  Pain/Pressure: Absent     Pelvic: Cervical exam deferred        Extremities: Normal range of motion.     Mental Status: Normal mood and affect. Normal behavior. Normal judgment and thought content.   Assessment and Plan:  Pregnancy: G4P3003 at 7675w6d  1. Supervision of other normal pregnancy, antepartum     Doing well  2. Low vitamin D level     Taking weekly vitamin D  3. Anemia during pregnancy in third trimester     Stable, Hgb: 10.4, taking iron supplementation.   Preterm labor symptoms and general obstetric precautions including but not limited to vaginal bleeding, contractions, leaking of fluid and fetal  movement were reviewed in detail with the patient. Please refer to After Visit Summary for other counseling recommendations.  Return in about 1 week (around 04/10/2018) for ROB.  Future Appointments  Date Time Provider Department Center  04/10/2018 11:15 AM Roe Coombsenney, Lucilia Yanni A, CNM [facility] None    Roe Coombsachelle A Arlind Klingerman, CNM

## 2018-04-04 DIAGNOSIS — O479 False labor, unspecified: Secondary | ICD-10-CM

## 2018-04-04 DIAGNOSIS — Z3493 Encounter for supervision of normal pregnancy, unspecified, third trimester: Secondary | ICD-10-CM | POA: Diagnosis present

## 2018-04-04 DIAGNOSIS — Z3A38 38 weeks gestation of pregnancy: Secondary | ICD-10-CM

## 2018-04-04 NOTE — Discharge Instructions (Signed)
Braxton Hicks Contractions °Contractions of the uterus can occur throughout pregnancy, but they are not always a sign that you are in labor. You may have practice contractions called Braxton Hicks contractions. These false labor contractions are sometimes confused with true labor. °What are Braxton Hicks contractions? °Braxton Hicks contractions are tightening movements that occur in the muscles of the uterus before labor. Unlike true labor contractions, these contractions do not result in opening (dilation) and thinning of the cervix. Toward the end of pregnancy (32-34 weeks), Braxton Hicks contractions can happen more often and may become stronger. These contractions are sometimes difficult to tell apart from true labor because they can be very uncomfortable. You should not feel embarrassed if you go to the hospital with false labor. °Sometimes, the only way to tell if you are in true labor is for your health care provider to look for changes in the cervix. The health care provider will do a physical exam and may monitor your contractions. If you are not in true labor, the exam should show that your cervix is not dilating and your water has not broken. °If there are other health problems associated with your pregnancy, it is completely safe for you to be sent home with false labor. You may continue to have Braxton Hicks contractions until you go into true labor. °How to tell the difference between true labor and false labor °True labor °· Contractions last 30-70 seconds. °· Contractions become very regular. °· Discomfort is usually felt in the top of the uterus, and it spreads to the lower abdomen and low back. °· Contractions do not go away with walking. °· Contractions usually become more intense and increase in frequency. °· The cervix dilates and gets thinner. °False labor °· Contractions are usually shorter and not as strong as true labor contractions. °· Contractions are usually irregular. °· Contractions  are often felt in the front of the lower abdomen and in the groin. °· Contractions may go away when you walk around or change positions while lying down. °· Contractions get weaker and are shorter-lasting as time goes on. °· The cervix usually does not dilate or become thin. °Follow these instructions at home: °· Take over-the-counter and prescription medicines only as told by your health care provider. °· Keep up with your usual exercises and follow other instructions from your health care provider. °· Eat and drink lightly if you think you are going into labor. °· If Braxton Hicks contractions are making you uncomfortable: °? Change your position from lying down or resting to walking, or change from walking to resting. °? Sit and rest in a tub of warm water. °? Drink enough fluid to keep your urine pale yellow. Dehydration may cause these contractions. °? Do slow and deep breathing several times an hour. °· Keep all follow-up prenatal visits as told by your health care provider. This is important. °Contact a health care provider if: °· You have a fever. °· You have continuous pain in your abdomen. °Get help right away if: °· Your contractions become stronger, more regular, and closer together. °· You have fluid leaking or gushing from your vagina. °· You pass blood-tinged mucus (bloody show). °· You have bleeding from your vagina. °· You have low back pain that you never had before. °· You feel your baby’s head pushing down and causing pelvic pressure. °· Your baby is not moving inside you as much as it used to. °Summary °· Contractions that occur before labor are called Braxton   Hicks contractions, false labor, or practice contractions. °· Braxton Hicks contractions are usually shorter, weaker, farther apart, and less regular than true labor contractions. True labor contractions usually become progressively stronger and regular and they become more frequent. °· Manage discomfort from Braxton Hicks contractions by  changing position, resting in a warm bath, drinking plenty of water, or practicing deep breathing. °This information is not intended to replace advice given to you by your health care provider. Make sure you discuss any questions you have with your health care provider. °Document Released: 02/16/2017 Document Revised: 02/16/2017 Document Reviewed: 02/16/2017 °Elsevier Interactive Patient Education © 2018 Elsevier Inc. ° °

## 2018-04-04 NOTE — MAU Note (Cosign Needed)
Lauren Mcclain is a 27 y.o. [phone] female at 4160w0d  RN Labor check, not seen by provider SVE by RN: Dilation: 3 Effacement (%): 50 Station: Ballotable Exam by:: Saks Incorporatedikki Risheq, RN  x 2 after walking x 1hr NST: FHR baseline 135 bpm, Variability: moderate, Accelerations:present, Decelerations:  Absent= Cat 1/Reactive Toco: q 2-436mins  D/C home  Cheral MarkerKimberly R Ocean Kearley CNM, [facility]NP-BC 04/04/2018 1:51 AM

## 2018-04-04 NOTE — MAU Note (Signed)
I have communicated with Joellyn HaffKim Booker, CNM and reviewed vital signs:  Vitals:   04/03/18 2345 04/04/18 0219  BP: 123/81 121/70  Pulse: 87 81  Resp: 16   Temp: 98.3 F (36.8 C)   SpO2: 100%     Vaginal exam:  Dilation: 3 Effacement (%): 50 Cervical Position: Posterior Station: Ballotable Presentation: Vertex Exam by:: Zenia ResidesNikki Niza Soderholm, RN ,   Also reviewed contraction pattern and that non-stress test is reactive.  It has been documented that patient is contracting every 3-4 minutes with no cervical change over 1 hour not indicating active labor.  Patient denies any other complaints.  Based on this report provider has given order for discharge.  A discharge order and diagnosis entered by a provider.   Labor discharge instructions reviewed with patient.

## 2018-04-09 ENCOUNTER — Inpatient Hospital Stay (EMERGENCY_DEPARTMENT_HOSPITAL)
Admission: AD | Admit: 2018-04-09 | Discharge: 2018-04-09 | Disposition: A | Discharge status: Home / Self Care | Payer: Medicaid Other | Source: Ambulatory Visit | Attending: Obstetrics and Gynecology | Admitting: Obstetrics and Gynecology

## 2018-04-09 ENCOUNTER — Encounter (HOSPITAL_COMMUNITY): Payer: Self-pay | Admitting: *Deleted

## 2018-04-09 ENCOUNTER — Other Ambulatory Visit: Payer: Self-pay

## 2018-04-09 DIAGNOSIS — Z3A38 38 weeks gestation of pregnancy: Secondary | ICD-10-CM | POA: Diagnosis not present

## 2018-04-09 DIAGNOSIS — O0932 Supervision of pregnancy with insufficient antenatal care, second trimester: Secondary | ICD-10-CM

## 2018-04-09 DIAGNOSIS — O479 False labor, unspecified: Secondary | ICD-10-CM | POA: Diagnosis present

## 2018-04-09 DIAGNOSIS — O471 False labor at or after 37 completed weeks of gestation: Secondary | ICD-10-CM | POA: Diagnosis not present

## 2018-04-09 DIAGNOSIS — Z3480 Encounter for supervision of other normal pregnancy, unspecified trimester: Secondary | ICD-10-CM

## 2018-04-09 NOTE — Progress Notes (Signed)
CNM will return call, currently busy.

## 2018-04-09 NOTE — MAU Note (Signed)
I have communicated with Lauren Mcclain, CNM and reviewed vital signs:  Vitals:   04/09/18 1032 04/09/18 1033  BP: 110/65 110/65  Pulse: 86 86  Resp:  19  Temp:  98.9 F (37.2 C)  SpO2:  100%    Vaginal exam:  Dilation: 3.5 Effacement (%): Thick Cervical Position: Posterior Station: -3 Presentation: Vertex Exam by:: [name], RNC,   Also reviewed contraction pattern and that non-stress test is reactive.  It has been documented that patient is contracting irregularly with minimal cervical change since her previous cervical exam 6 days ago, not indicating active labor.  Patient denies any other complaints.  Based on this report provider has given order for discharge.  A discharge order and diagnosis entered by a provider.   Labor discharge instructions reviewed with patient.

## 2018-04-09 NOTE — MAU Provider Note (Signed)
Lauren Mcclain is a [id number] at 6019w5d seen in MAU for labor. RN labor check, not seen by provider. SVE by RN Dilation: 3.5 Effacement (%): Thick Cervical Position: Posterior Station: -3 Presentation: Vertex Exam by:: F. Morris, RNC   NST - FHR: 135 bpm / moderate variability / accels present / decels absent / TOCO: irregular UC's   Plan:  D/C home with labor precautions Keep scheduled appt with [facility] on 04/10/2018  Raelyn Moraolitta Deitrick Ferreri, MSN, CNM 04/09/2018 10:31 AM

## 2018-04-09 NOTE — MAU Note (Signed)
Presents with c/o ctxs that began @ 0800 this morning.  Reports ctxs are q 2-3 minutes.  Denies LOF or VB.  Reports +FM.

## 2018-04-10 ENCOUNTER — Ambulatory Visit (INDEPENDENT_AMBULATORY_CARE_PROVIDER_SITE_OTHER): Payer: Medicaid Other | Admitting: Certified Nurse Midwife

## 2018-04-10 ENCOUNTER — Inpatient Hospital Stay (HOSPITAL_COMMUNITY): Payer: Medicaid Other | Admitting: Anesthesiology

## 2018-04-10 ENCOUNTER — Inpatient Hospital Stay (HOSPITAL_COMMUNITY)
Admission: AD | Admit: 2018-04-10 | Discharge: 2018-04-12 | DRG: 807 | Disposition: A | Discharge status: Home / Self Care | Payer: Medicaid Other | Attending: Family Medicine | Admitting: Family Medicine

## 2018-04-10 ENCOUNTER — Encounter (HOSPITAL_COMMUNITY): Payer: Self-pay | Admitting: *Deleted

## 2018-04-10 ENCOUNTER — Other Ambulatory Visit: Payer: Self-pay

## 2018-04-10 ENCOUNTER — Encounter: Payer: Self-pay | Admitting: Certified Nurse Midwife

## 2018-04-10 VITALS — BP 130/63 | HR 71 | Wt 165.8 lb

## 2018-04-10 DIAGNOSIS — Z87891 Personal history of nicotine dependence: Secondary | ICD-10-CM

## 2018-04-10 DIAGNOSIS — R7989 Other specified abnormal findings of blood chemistry: Secondary | ICD-10-CM

## 2018-04-10 DIAGNOSIS — O479 False labor, unspecified: Secondary | ICD-10-CM

## 2018-04-10 DIAGNOSIS — Z3A38 38 weeks gestation of pregnancy: Secondary | ICD-10-CM

## 2018-04-10 DIAGNOSIS — Z3480 Encounter for supervision of other normal pregnancy, unspecified trimester: Secondary | ICD-10-CM

## 2018-04-10 DIAGNOSIS — O99013 Anemia complicating pregnancy, third trimester: Secondary | ICD-10-CM

## 2018-04-10 DIAGNOSIS — O9902 Anemia complicating childbirth: Secondary | ICD-10-CM | POA: Diagnosis present

## 2018-04-10 DIAGNOSIS — Z3483 Encounter for supervision of other normal pregnancy, third trimester: Secondary | ICD-10-CM | POA: Diagnosis present

## 2018-04-10 DIAGNOSIS — D649 Anemia, unspecified: Secondary | ICD-10-CM | POA: Diagnosis present

## 2018-04-10 DIAGNOSIS — O0932 Supervision of pregnancy with insufficient antenatal care, second trimester: Secondary | ICD-10-CM

## 2018-04-10 LAB — CBC
HEMATOCRIT: 31.5 % — AB (ref 36.0–46.0)
HEMOGLOBIN: 10.4 g/dL — AB (ref 12.0–15.0)
MCH: 28.7 pg (ref 26.0–34.0)
MCHC: 33 g/dL (ref 30.0–36.0)
MCV: 87 fL (ref 78.0–100.0)
Platelets: 149 10*3/uL — ABNORMAL LOW (ref 150–400)
RBC: 3.62 MIL/uL — ABNORMAL LOW (ref 3.87–5.11)
RDW: 14.1 % (ref 11.5–15.5)
WBC: 10.4 10*3/uL (ref 4.0–10.5)

## 2018-04-10 LAB — RAPID URINE DRUG SCREEN, HOSP PERFORMED
AMPHETAMINES: NOT DETECTED
Benzodiazepines: NOT DETECTED
COCAINE: NOT DETECTED
OPIATES: NOT DETECTED
Tetrahydrocannabinol: NOT DETECTED

## 2018-04-10 LAB — TYPE AND SCREEN
ABO/RH(D): B POS
ANTIBODY SCREEN: NEGATIVE

## 2018-04-10 MED ORDER — LACTATED RINGERS IV SOLN
500.0000 mL | Freq: Once | INTRAVENOUS | Status: AC
  Administered 2018-04-10: 500 mL via INTRAVENOUS

## 2018-04-10 MED ORDER — ACETAMINOPHEN 325 MG PO TABS: 650.0000 mg | ORAL_TABLET | ORAL | Status: DC | PRN

## 2018-04-10 MED ORDER — FENTANYL CITRATE (PF) 100 MCG/2ML IJ SOLN: 100.0000 ug | INTRAMUSCULAR | Status: DC | PRN

## 2018-04-10 MED ORDER — OXYTOCIN 40 UNITS IN LACTATED RINGERS INFUSION - SIMPLE MED
INTRAVENOUS | Status: AC
  Administered 2018-04-10: 2.5 [IU]/h via INTRAVENOUS
  Filled 2018-04-10: qty 1000

## 2018-04-10 MED ORDER — TETANUS-DIPHTH-ACELL PERTUSSIS 5-2.5-18.5 LF-MCG/0.5 IM SUSP: 0.5000 mL | Freq: Once | INTRAMUSCULAR | Status: DC

## 2018-04-10 MED ORDER — WITCH HAZEL-GLYCERIN EX PADS: 1.0000 "application " | MEDICATED_PAD | CUTANEOUS | Status: DC | PRN

## 2018-04-10 MED ORDER — COCONUT OIL OIL: 1.0000 "application " | TOPICAL_OIL | Status: DC | PRN

## 2018-04-10 MED ORDER — OXYCODONE-ACETAMINOPHEN 5-325 MG PO TABS: 1.0000 | ORAL_TABLET | ORAL | Status: DC | PRN

## 2018-04-10 MED ORDER — ONDANSETRON HCL 4 MG/2ML IJ SOLN: 4.0000 mg | Freq: Four times a day (QID) | INTRAMUSCULAR | Status: DC | PRN

## 2018-04-10 MED ORDER — PHENYLEPHRINE 40 MCG/ML (10ML) SYRINGE FOR IV PUSH (FOR BLOOD PRESSURE SUPPORT)
80.0000 ug | PREFILLED_SYRINGE | INTRAVENOUS | Status: DC | PRN
  Filled 2018-04-10: qty 5

## 2018-04-10 MED ORDER — DIBUCAINE 1 % RE OINT: 1.0000 "application " | TOPICAL_OINTMENT | RECTAL | Status: DC | PRN

## 2018-04-10 MED ORDER — LIDOCAINE HCL (PF) 1 % IJ SOLN
INTRAMUSCULAR | Status: AC
  Filled 2018-04-10: qty 30

## 2018-04-10 MED ORDER — ACETAMINOPHEN 325 MG PO TABS
650.0000 mg | ORAL_TABLET | ORAL | Status: DC | PRN
  Administered 2018-04-11 (×2): 650 mg via ORAL
  Filled 2018-04-10 (×2): qty 2

## 2018-04-10 MED ORDER — LACTATED RINGERS IV SOLN: 500.0000 mL | INTRAVENOUS | Status: DC | PRN

## 2018-04-10 MED ORDER — ZOLPIDEM TARTRATE 5 MG PO TABS: 5.0000 mg | ORAL_TABLET | Freq: Every evening | ORAL | Status: DC | PRN

## 2018-04-10 MED ORDER — BENZOCAINE-MENTHOL 20-0.5 % EX AERO
1.0000 "application " | INHALATION_SPRAY | CUTANEOUS | Status: DC | PRN
  Administered 2018-04-11: 1 via TOPICAL
  Filled 2018-04-10: qty 56

## 2018-04-10 MED ORDER — IBUPROFEN 600 MG PO TABS
600.0000 mg | ORAL_TABLET | Freq: Four times a day (QID) | ORAL | Status: DC
  Administered 2018-04-10 – 2018-04-12 (×5): 600 mg via ORAL
  Filled 2018-04-10 (×5): qty 1

## 2018-04-10 MED ORDER — SENNOSIDES-DOCUSATE SODIUM 8.6-50 MG PO TABS
2.0000 | ORAL_TABLET | ORAL | Status: DC
  Administered 2018-04-11 – 2018-04-12 (×2): 2 via ORAL
  Filled 2018-04-10 (×2): qty 2

## 2018-04-10 MED ORDER — EPHEDRINE 5 MG/ML INJ
10.0000 mg | INTRAVENOUS | Status: DC | PRN
  Filled 2018-04-10: qty 2

## 2018-04-10 MED ORDER — SOD CITRATE-CITRIC ACID 500-334 MG/5ML PO SOLN: 30.0000 mL | ORAL | Status: DC | PRN

## 2018-04-10 MED ORDER — ONDANSETRON HCL 4 MG PO TABS: 4.0000 mg | ORAL_TABLET | ORAL | Status: DC | PRN

## 2018-04-10 MED ORDER — LIDOCAINE HCL (PF) 1 % IJ SOLN
30.0000 mL | INTRAMUSCULAR | Status: DC | PRN
  Filled 2018-04-10: qty 30

## 2018-04-10 MED ORDER — LACTATED RINGERS IV SOLN: INTRAVENOUS | Status: DC

## 2018-04-10 MED ORDER — FENTANYL 2.5 MCG/ML BUPIVACAINE 1/10 % EPIDURAL INFUSION (WH - ANES)
14.0000 mL/h | INTRAMUSCULAR | Status: DC | PRN
  Administered 2018-04-10: 14 mL/h via EPIDURAL
  Filled 2018-04-10: qty 100

## 2018-04-10 MED ORDER — DIPHENHYDRAMINE HCL 50 MG/ML IJ SOLN: 12.5000 mg | INTRAMUSCULAR | Status: DC | PRN

## 2018-04-10 MED ORDER — OXYCODONE-ACETAMINOPHEN 5-325 MG PO TABS: 2.0000 | ORAL_TABLET | ORAL | Status: DC | PRN

## 2018-04-10 MED ORDER — SIMETHICONE 80 MG PO CHEW: 80.0000 mg | CHEWABLE_TABLET | ORAL | Status: DC | PRN

## 2018-04-10 MED ORDER — ONDANSETRON HCL 4 MG/2ML IJ SOLN: 4.0000 mg | INTRAMUSCULAR | Status: DC | PRN

## 2018-04-10 MED ORDER — PHENYLEPHRINE 40 MCG/ML (10ML) SYRINGE FOR IV PUSH (FOR BLOOD PRESSURE SUPPORT)
80.0000 ug | PREFILLED_SYRINGE | INTRAVENOUS | Status: DC | PRN
  Filled 2018-04-10: qty 5
  Filled 2018-04-10: qty 10

## 2018-04-10 MED ORDER — LIDOCAINE HCL (PF) 1 % IJ SOLN
INTRAMUSCULAR | Status: DC | PRN
  Administered 2018-04-10 (×2): 5 mL via EPIDURAL

## 2018-04-10 MED ORDER — PRENATAL MULTIVITAMIN CH
1.0000 | ORAL_TABLET | Freq: Every day | ORAL | Status: DC
  Administered 2018-04-11: 1 via ORAL
  Filled 2018-04-10: qty 1

## 2018-04-10 MED ORDER — OXYTOCIN BOLUS FROM INFUSION
500.0000 mL | Freq: Once | INTRAVENOUS | Status: AC
  Administered 2018-04-10: 500 mL via INTRAVENOUS

## 2018-04-10 MED ORDER — DIPHENHYDRAMINE HCL 25 MG PO CAPS
25.0000 mg | ORAL_CAPSULE | Freq: Four times a day (QID) | ORAL | Status: DC | PRN
  Administered 2018-04-10: 25 mg via ORAL
  Filled 2018-04-10: qty 1

## 2018-04-10 MED ORDER — OXYTOCIN 40 UNITS IN LACTATED RINGERS INFUSION - SIMPLE MED
2.5000 [IU]/h | INTRAVENOUS | Status: DC
  Administered 2018-04-10: 2.5 [IU]/h via INTRAVENOUS

## 2018-04-10 NOTE — Anesthesia Procedure Notes (Signed)
Epidural Patient location during procedure: OB  Staffing Anesthesiologist: Aurorah Schlachter, MD Performed: anesthesiologist   Preanesthetic Checklist Completed: patient identified, site marked, surgical consent, pre-op evaluation, timeout performed, IV checked, risks and benefits discussed and monitors and equipment checked  Epidural Patient position: sitting Prep: DuraPrep Patient monitoring: heart rate, continuous pulse ox and blood pressure Approach: right paramedian Location: L3-L4 Injection technique: LOR saline  Needle:  Needle type: Tuohy  Needle gauge: 17 G Needle length: 9 cm and 9 Needle insertion depth: 6 cm Catheter type: closed end flexible Catheter size: 20 Guage Catheter at skin depth: 10 cm Test dose: negative  Assessment Events: blood not aspirated, injection not painful, no injection resistance, negative IV test and no paresthesia  Additional Notes Patient identified. Risks/Benefits/Options discussed with patient including but not limited to bleeding, infection, nerve damage, paralysis, failed block, incomplete pain control, headache, blood pressure changes, nausea, vomiting, reactions to medication both or allergic, itching and postpartum back pain. Confirmed with bedside nurse the patient's most recent platelet count. Confirmed with patient that they are not currently taking any anticoagulation, have any bleeding history or any family history of bleeding disorders. Patient expressed understanding and wished to proceed. All questions were answered. Sterile technique was used throughout the entire procedure. Please see nursing notes for vital signs. Test dose was given through epidural needle and negative prior to continuing to dose epidural or start infusion. Warning signs of high block given to the patient including shortness of breath, tingling/numbness in hands, complete motor block, or any concerning symptoms with instructions to call for help. Patient was given  instructions on fall risk and not to get out of bed. All questions and concerns addressed with instructions to call with any issues.     

## 2018-04-10 NOTE — MAU Note (Signed)
Went to appointment today and was 4cm. contraction are more regular and closer together

## 2018-04-10 NOTE — Progress Notes (Signed)
   PRENATAL VISIT NOTE  Subjective:  Lauren Mcclain is a 27 y.o. [phone] at 5436w6d being seen today for ongoing prenatal care.  She is currently monitored for the following issues for this low-risk pregnancy and has Low vitamin D level; Mild tetrahydrocannabinol (THC) abuse; Supervision of other normal pregnancy, antepartum; Late prenatal care affecting pregnancy in second trimester; Anemia in pregnancy; and False labor on their problem list.  Patient reports no bleeding, no leaking and occasional contractions.  Contractions: Irritability. Vag. Bleeding: None.  Movement: Present. Denies leaking of fluid.   The following portions of the patient's history were reviewed and updated as appropriate: allergies, current medications, past family history, past medical history, past social history, past surgical history and problem list. Problem list updated.  Objective:   Vitals:   04/10/18 1122  BP: 130/63  Pulse: 71  Weight: 165 lb 12.8 oz (75.2 kg)    Fetal Status: Fetal Heart Rate (bpm): 135; doppler Fundal Height: 35 cm Movement: Present  Presentation: Vertex  General:  Alert, oriented and cooperative. Patient is in no acute distress.  Skin: Skin is warm and dry. No rash noted.   Cardiovascular: Normal heart rate noted  Respiratory: Normal respiratory effort, no problems with respiration noted  Abdomen: Soft, gravid, appropriate for gestational age.  Pain/Pressure: Absent     Pelvic: Cervical exam performed Dilation: 4 Effacement (%): 50 Station: -3  Extremities: Normal range of motion.  Edema: None  Mental Status: Normal mood and affect. Normal behavior. Normal judgment and thought content.   Assessment and Plan:  Pregnancy: G4P3003 at 6436w6d  1. Supervision of other normal pregnancy, antepartum     Doing well.   2. Low vitamin D level    Taking weekly vitamin D  3. Anemia during pregnancy in third trimester     Stable, taking Bloom  Term labor symptoms and general obstetric  precautions including but not limited to vaginal bleeding, contractions, leaking of fluid and fetal movement were reviewed in detail with the patient. Please refer to After Visit Summary for other counseling recommendations.  Return in about 1 week (around 04/17/2018) for ROB.  Future Appointments  Date Time Provider Department Center  04/18/2018 10:30 AM Roe Coombsenney, Afsheen Antony A, CNM [facility] None    Roe Coombsachelle A Omolara Carol, CNM

## 2018-04-10 NOTE — H&P (Addendum)
LABOR ADMISSION HISTORY AND PHYSICAL  Lauren Mcclain is a 27 y.o. female [phone] with IUP at 7160w6d by 6wk US presenting for SOL. She reports +FMs, No LOF, no VB, no blurry vision, headaches or peripheral edema, and RUQ pain.  She plans on bottle feeding. She request IUD for birth control.  Dating: By 6 wk [place] --->  Estimated Date of Delivery: 04/18/18  [facility] Patient Active Problem List   Diagnosis Date Noted  . False labor 04/09/2018  . Anemia in pregnancy 02/07/2018  . Supervision of other normal pregnancy, antepartum 12/15/2017  . Late prenatal care affecting pregnancy in second trimester 12/15/2017  . Low vitamin D level 11/30/2016  . Mild tetrahydrocannabinol [facility](THC) abuse 11/30/2016   Past Medical History: Past Medical History:  Diagnosis Date  . Chlamydia   . Medical history non-contributory   . Trichomonas contact, treated     Past Surgical History: Past Surgical History:  Procedure Laterality Date  . NO PAST SURGERIES      Obstetrical History: OB History    Gravida  4   Para  3   Term  3   Preterm      AB      Living  3     SAB      TAB      Ectopic      Multiple  0   Live Births  3           Social History: Social History   Socioeconomic History  . Marital status: Single    Spouse name: Not on file  . Number of children: Not on file  . Years of education: Not on file  . Highest education level: Not on file  Occupational History  . Not on file  Social Needs  . Financial resource strain: Not on file  . Food insecurity:    Worry: Not on file    Inability: Not on file  . Transportation needs:    Medical: Not on file    Non-medical: Not on file  Tobacco Use  . Smoking status: Former Smoker    Years: 7.00    Types: Cigars    Last attempt to quit: 2018    Years since quitting: 1.4  . Smokeless tobacco: Never Used  . Tobacco comment: black and mild; 1/d  Substance and Sexual Activity  .  Alcohol use: No    Comment: Not currently  . Drug use: Not Currently    Frequency: 28.0 times per week    Types: Marijuana    Comment: December 2018  . Sexual activity: Not Currently    Birth control/protection: None  Lifestyle  . Physical activity:    Days per week: Not on file    Minutes per session: Not on file  . Stress: Not on file  Relationships  . Social connections:    Talks on phone: Not on file    Gets together: Not on file    Attends religious service: Not on file    Active member of club or organization: Not on file    Attends meetings of clubs or organizations: Not on file    Relationship status: Not on file  Other Topics Concern  . Not on file  Social History Narrative  . Not on file    Family History: Family History  Problem Relation Age of Onset  . Drug abuse Mother   . Drug abuse Father     Allergies: No Known  Allergies  Medications Prior to Admission  Medication Sig Dispense Refill Last Dose  . acetaminophen (TYLENOL) 500 MG tablet Take 1,000 mg by mouth every 6 (six) hours as needed for headache.   04/09/2018 at Unknown time  . Prenatal Vit-Fe Phos-FA-Omega (VITAFOL GUMMIES) 3.33-0.333-34.8 MG CHEW Chew 3 tablets by mouth at bedtime. 90 tablet 12 04/09/2018 at Unknown time  . Elastic Bandages & Supports (COMFORT FIT MATERNITY SUPP MED) MISC 1 Device by Does not apply route daily. (Patient not taking: Reported on 12/15/2017) 1 each 0 Not Taking  . Vitamin D, Ergocalciferol, (DRISDOL) 50000 units CAPS capsule Take 1 capsule (50,000 Units total) by mouth every 7 (seven) days. (Patient not taking: Reported on 04/09/2018) 30 capsule 2 Not Taking at Unknown time    Review of Systems   All systems reviewed and negative except as stated in HPI  Blood pressure 138/85, pulse (!) 110, resp. rate 18, last menstrual period 07/24/2017, unknown if currently breastfeeding. General appearance: alert, cooperative and no distress HEENT: no JVD, MMM Abdomen: soft,  non-tender; bowel sounds normal Extremities: Homans sign is negative, no sign of DVT Presentation: cephalic Fetal monitoringBaseline: 125 bpm, Variability: Good {> 6 bpm), Accelerations: Reactive and Decelerations: Absent Uterine activityFrequency: Every 2-3 minutes Dilation: 4.5 Effacement (%): 70 Station: -2 Exam by:: K.Wilson,RN   Prenatal labs: ABO, Rh: B/Positive/-- (03/01 1140) Antibody: Negative (03/01 1140) Rubella: 2.58 (03/01 1140) RPR: Non Reactive (04/23 1118)  HBsAg: Negative (03/01 1140)  HIV: Non Reactive (04/23 1118)  GBS: Negative (06/03 0000)  2 hr Glucola normal  Prenatal Transfer Tool  Maternal Diabetes: No Genetic Screening: Declined Maternal Ultrasounds/Referrals: Normal Fetal Ultrasounds or other Referrals:  None Maternal Substance Abuse:  Yes:  Type: Marijuana Significant Maternal Medications:  None Significant Maternal Lab Results: None  No results found for this or any previous visit (from the past 24 hour(s)).  Patient Active Problem List   Diagnosis Date Noted  . False labor 04/09/2018  . Anemia in pregnancy 02/07/2018  . Supervision of other normal pregnancy, antepartum 12/15/2017  . Late prenatal care affecting pregnancy in second trimester 12/15/2017  . Low vitamin D level 11/30/2016  . Mild tetrahydrocannabinol [facility]) abuse 11/30/2016    Assessment: Lauren Mcclain is a 28 y.o. [phone] at [redacted]w[redacted]d here for SOL  #Labor:active labor, expectant mgmt #Pain: epidural #FWB: Cat I #ID:  GBS neg #MOF: Bottle  #MOC:IUD #Circ:  N/A  Thomes Dinning, MD, MS FAMILY MEDICINE RESIDENT - PGY1 04/10/2018 5:05 PM   OB FELLOW HISTORY AND PHYSICAL ATTESTATION  I have seen and examined this patient; I agree with above documentation in the resident's note.    Frederik Pear, MD OB Fellow 04/10/2018

## 2018-04-10 NOTE — Anesthesia Preprocedure Evaluation (Signed)

## 2018-04-11 LAB — RPR: RPR: NONREACTIVE

## 2018-04-11 NOTE — Anesthesia Postprocedure Evaluation (Signed)
Anesthesia Post Note  Patient: Lauren Mcclain  Procedure(s) Performed: AN AD HOC LABOR EPIDURAL     Patient location during evaluation: Mother Baby Anesthesia Type: Epidural Level of consciousness: awake and alert Pain management: pain level controlled Vital Signs Assessment: post-procedure vital signs reviewed and stable Respiratory status: spontaneous breathing, nonlabored ventilation and respiratory function stable Cardiovascular status: stable Postop Assessment: no headache, no backache, epidural receding, no apparent nausea or vomiting, able to ambulate, patient able to bend at knees and adequate PO intake Anesthetic complications: no    Last Vitals:  Vitals:   04/11/18 0140 04/11/18 0534  BP: 117/66 115/73  Pulse: 69 61  Resp: 17 18  Temp: 36.9 C 36.8 C  SpO2:      Last Pain:  Vitals:   04/11/18 0535  TempSrc:   PainSc: 0-No pain   Pain Goal: Patients Stated Pain Goal: 3 (04/10/18 1803)               Laban EmperorMalinova,Vayla Wilhelmi Hristova

## 2018-04-11 NOTE — Progress Notes (Signed)
CSW received consult for hx of marijuana use.  Referral was screened out due to the following: ~MOB had no documented substance use after initial prenatal visit/+UPT. ~MOB had no positive drug screens after initial prenatal visit/+UPT. ~Baby's UDS is negative.  Please consult CSW if current concerns arise or by MOB's request.  CSW will monitor CDS results and make report to Child Protective Services if warranted. 

## 2018-04-11 NOTE — Progress Notes (Signed)
POSTPARTUM PROGRESS NOTE  Post Partum Day 1  Subjective:  Lauren Mcclain is a 27 y.o. [id number] s/p VAVD at 817w6d.  She reports she is doing well. No acute events overnight. She denies any problems with ambulating, voiding or po intake. Denies nausea or vomiting. Some cramping, but pain is well controlled.  Lochia is moderate.  Objective: Blood pressure 115/73, pulse 61, temperature 98.3 F (36.8 C), temperature source Oral, resp. rate 18, height 5\' 8"  (1.727 m), weight 165 lb (74.8 kg), last menstrual period 07/24/2017, SpO2 100 %, unknown if currently breastfeeding.  Physical Exam:  General: alert, cooperative and no distress Chest: no respiratory distress Heart:regular rate, distal pulses intact Abdomen: soft, nontender,  Uterine Fundus: firm, appropriately tender DVT Evaluation: No calf swelling or tenderness Extremities: no edema Skin: warm, dry  Recent Labs    04/10/18 1700  HGB 10.4*  HCT 31.5*    Assessment/Plan: Lauren Mcclain is a 27 y.o. [id number] s/p VAVD at 977w6d   PPD#1: Doing well  Routine postpartum care Contraception: Depo Feeding: bottle Dispo: Plan for discharge tomorrow.   LOS: 1 day   Kandra NicolasJulie P DegeleMD 04/11/2018, 7:19 AM

## 2018-04-12 MED ORDER — IBUPROFEN 600 MG PO TABS: 600.0000 mg | ORAL_TABLET | Freq: Four times a day (QID) | ORAL | 0 refills | Status: DC

## 2018-04-12 NOTE — Progress Notes (Signed)
D/c instructions discussed to include when to call the doctor & postpartum depression.  PreE flyer attached to AVS & reviewed with pt.  Pt aware to use mom/baby care booklet for reference as needed.

## 2018-04-12 NOTE — Discharge Instructions (Addendum)
Vaginal Delivery, Care After °Refer to this sheet in the next few weeks. These instructions provide you with information about caring for yourself after vaginal delivery. Your health care provider may also give you more specific instructions. Your treatment has been planned according to current medical practices, but problems sometimes occur. Call your health care provider if you have any problems or questions. °What can I expect after the procedure? °After vaginal delivery, it is common to have: °· Some bleeding from your vagina. °· Soreness in your abdomen, your vagina, and the area of skin between your vaginal opening and your anus (perineum). °· Pelvic cramps. °· Fatigue. ° °Follow these instructions at home: °Medicines °· Take over-the-counter and prescription medicines only as told by your health care provider. °· If you were prescribed an antibiotic medicine, take it as told by your health care provider. Do not stop taking the antibiotic until it is finished. °Driving ° °· Do not drive or operate heavy machinery while taking prescription pain medicine. °· Do not drive for 24 hours if you received a sedative. °Lifestyle °· Do not drink alcohol. This is especially important if you are breastfeeding or taking medicine to relieve pain. °· Do not use tobacco products, including cigarettes, chewing tobacco, or e-cigarettes. If you need help quitting, ask your health care provider. °Eating and drinking °· Drink at least 8 eight-ounce glasses of water every day unless you are told not to by your health care provider. If you choose to breastfeed your baby, you may need to drink more water than this. °· Eat high-fiber foods every day. These foods may help prevent or relieve constipation. High-fiber foods include: °? Whole grain cereals and breads. °? Brown rice. °? Beans. °? Fresh fruits and vegetables. °Activity °· Return to your normal activities as told by your health care provider. Ask your health care provider  what activities are safe for you. °· Rest as much as possible. Try to rest or take a nap when your baby is sleeping. °· Do not lift anything that is heavier than your baby or 10 lb (4.5 kg) until your health care provider says that it is safe. °· Talk with your health care provider about when you can engage in sexual activity. This may depend on your: °? Risk of infection. °? Rate of healing. °? Comfort and desire to engage in sexual activity. °Vaginal Care °· If you have an episiotomy or a vaginal tear, check the area every day for signs of infection. Check for: °? More redness, swelling, or pain. °? More fluid or blood. °? Warmth. °? Pus or a bad smell. °· Do not use tampons or douches until your health care provider says this is safe. °· Watch for any blood clots that may pass from your vagina. These may look like clumps of dark red, brown, or black discharge. °General instructions °· Keep your perineum clean and dry as told by your health care provider. °· Wear loose, comfortable clothing. °· Wipe from front to back when you use the toilet. °· Ask your health care provider if you can shower or take a bath. If you had an episiotomy or a perineal tear during labor and delivery, your health care provider may tell you not to take baths for a certain length of time. °· Wear a bra that supports your breasts and fits you well. °· If possible, have someone help you with household activities and help care for your baby for at least a few days after   you leave the hospital. °· Keep all follow-up visits for you and your baby as told by your health care provider. This is important. °Contact a health care provider if: °· You have: °? Vaginal discharge that has a bad smell. °? Difficulty urinating. °? Pain when urinating. °? A sudden increase or decrease in the frequency of your bowel movements. °? More redness, swelling, or pain around your episiotomy or vaginal tear. °? More fluid or blood coming from your episiotomy or  vaginal tear. °? Pus or a bad smell coming from your episiotomy or vaginal tear. °? A fever. °? A rash. °? Little or no interest in activities you used to enjoy. °? Questions about caring for yourself or your baby. °· Your episiotomy or vaginal tear feels warm to the touch. °· Your episiotomy or vaginal tear is separating or does not appear to be healing. °· Your breasts are painful, hard, or turn red. °· You feel unusually sad or worried. °· You feel nauseous or you vomit. °· You pass large blood clots from your vagina. If you pass a blood clot from your vagina, save it to show to your health care provider. Do not flush blood clots down the toilet without having your health care provider look at them. °· You urinate more than usual. °· You are dizzy or light-headed. °· You have not breastfed at all and you have not had a menstrual period for 12 weeks after delivery. °· You have stopped breastfeeding and you have not had a menstrual period for 12 weeks after you stopped breastfeeding. °Get help right away if: °· You have: °? Pain that does not go away or does not get better with medicine. °? Chest pain. °? Difficulty breathing. °? Blurred vision or spots in your vision. °? Thoughts about hurting yourself or your baby. °· You develop pain in your abdomen or in one of your legs. °· You develop a severe headache. °· You faint. °· You bleed from your vagina so much that you fill two sanitary pads in one hour. °This information is not intended to replace advice given to you by your health care provider. Make sure you discuss any questions you have with your health care provider. °Document Released: 09/30/2000 Document Revised: 03/16/2016 Document Reviewed: 10/18/2015 °Elsevier Interactive Patient Education © 2018 [employer] Inc[employer] ° °

## 2018-04-12 NOTE — Discharge Summary (Addendum)
OB Discharge Summary     Patient Name: Lauren Mcclain DOB: [birth year] MRN: [id number]  Date of admission: 04/10/2018 Delivering MD: Fanny BienHOMPSON, AARON B   Date of discharge: 04/12/2018  Admitting diagnosis: 37.6WK CTX Intrauterine pregnancy: 1840w6d     Secondary diagnosis:  Active Problems:   Normal labor  Additional problems: Anemia     Discharge diagnosis: Term Pregnancy Delivered                                                                                                Post partum procedures:None  Augmentation: AROM  Complications: None  Hospital course:  Onset of Labor With Vaginal Delivery     27 y.o. yo [id number] at 3140w6d was admitted in Active Labor on 04/10/2018. Patient had an uncomplicated labor course as follows:  Membrane Rupture Time/Date: 6:18 PM ,04/10/2018   Intrapartum Procedures: Episiotomy: None [1]                                         Lacerations:  None [1]  Patient had a delivery of a Viable infant. 04/10/2018  Information for the patient's newborn:  Unk PintoHolsey, Girl Kynslee [[id number]][[id number]]  Delivery Method: Vaginal, Vacuum (Extractor)(Filed from Delivery Summary)    Pateint had an uncomplicated postpartum course.  She is ambulating, tolerating a regular diet, passing flatus, and urinating well. Patient is discharged home in stable condition on 04/12/18.   Physical exam  Vitals:   04/11/18 0534 04/11/18 0935 04/11/18 2152 04/12/18 0622  BP: 115/73 116/72 128/82 118/69  Pulse: 61 60 62 (!) 56  Resp: 18 18 17    Temp: 98.3 F (36.8 C) 98.2 F (36.8 C) 98.4 F (36.9 C) 97.9 F (36.6 C)  TempSrc: Oral Oral Oral Oral  SpO2:  98% 95% 100%  Weight:      Height:       General: alert, cooperative and no distress Lochia: appropriate Uterine Fundus: firm Incision: N/A DVT Evaluation: No evidence of DVT seen on physical exam. Labs: Lab Results  Component Value Date   WBC 10.4 04/10/2018   HGB 10.4 (L) 04/10/2018   HCT 31.5 (L) 04/10/2018   MCV 87.0  04/10/2018   PLT 149 (L) 04/10/2018   CMP Latest Ref Rng & Units 06/12/2017  Glucose 65 - 99 mg/dL 130(Q108(H)  BUN 6 - 20 mg/dL 5(L)  Creatinine 6.570.44 - 1.00 mg/dL 8.460.86  Sodium 962135 - 952145 mmol/L 138  Potassium 3.5 - 5.1 mmol/L 3.8  Chloride 101 - 111 mmol/L 106  CO2 22 - 32 mmol/L 24  Calcium 8.9 - 10.3 mg/dL 8.4(X8.8(L)  Total Protein 6.5 - 8.1 g/dL 7.0  Total Bilirubin 0.3 - 1.2 mg/dL 0.7  Alkaline Phos 38 - 126 U/L 55  AST 15 - 41 U/L 11(L)  ALT 14 - 54 U/L 11(L)    Discharge instruction: per After Visit Summary and "Baby and Me Booklet".  After visit meds:  Allergies as of 04/12/2018   No Known Allergies     Medication  List    STOP taking these medications   acetaminophen 500 MG tablet Commonly known as:  TYLENOL     TAKE these medications   COMFORT FIT MATERNITY SUPP MED Misc 1 Device by Does not apply route daily.   ibuprofen 600 MG tablet Commonly known as:  ADVIL,MOTRIN Take 1 tablet (600 mg total) by mouth every 6 (six) hours.   VITAFOL GUMMIES 3.33-0.333-34.8 MG Chew Chew 3 tablets by mouth at bedtime.   Vitamin D (Ergocalciferol) 50000 units Caps capsule Commonly known as:  DRISDOL Take 1 capsule (50,000 Units total) by mouth every 7 (seven) days.       Diet: routine diet  Activity: Advance as tolerated. Pelvic rest for 6 weeks.   Outpatient follow up:6 weeks Follow up Appt: Future Appointments  Date Time Provider Department Center  05/09/2018 11:00 AM Orvilla Cornwall A, CNM [facility] None   Follow up Visit:No follow-ups on file.  Postpartum contraception: Depo Provera  Newborn Data: Live born female  Birth Weight: 6 lb 14.2 oz (3125 g) APGAR: 9, 9  Newborn Delivery   Birth date/time:  04/10/2018 18:25:00 Delivery type:  Vaginal, Vacuum (Extractor)     Baby Feeding: Bottle Disposition:home with mother   04/12/2018 Garnette Gunner, MD  OB FELLOW DISCHARGE ATTESTATION  I have seen and examined this patient. I agree with above documentation  and have made edits as needed.   Caryl Ada, DO OB Fellow 11:21 AM

## 2018-04-18 ENCOUNTER — Encounter: Payer: Medicaid Other | Admitting: Certified Nurse Midwife

## 2018-05-09 ENCOUNTER — Ambulatory Visit (INDEPENDENT_AMBULATORY_CARE_PROVIDER_SITE_OTHER): Payer: Medicaid Other | Admitting: Obstetrics & Gynecology

## 2018-05-09 DIAGNOSIS — Z3042 Encounter for surveillance of injectable contraceptive: Secondary | ICD-10-CM | POA: Diagnosis not present

## 2018-05-09 DIAGNOSIS — Z3202 Encounter for pregnancy test, result negative: Secondary | ICD-10-CM

## 2018-05-09 DIAGNOSIS — Z1389 Encounter for screening for other disorder: Secondary | ICD-10-CM | POA: Diagnosis not present

## 2018-05-09 DIAGNOSIS — Z30013 Encounter for initial prescription of injectable contraceptive: Secondary | ICD-10-CM

## 2018-05-09 LAB — POCT URINE PREGNANCY: PREG TEST UR: NEGATIVE

## 2018-05-09 MED ORDER — MEDROXYPROGESTERONE ACETATE 150 MG/ML IM SUSP
150.0000 mg | Freq: Once | INTRAMUSCULAR | Status: AC
  Administered 2018-05-09: 150 mg via INTRAMUSCULAR

## 2018-05-09 NOTE — Patient Instructions (Signed)

## 2018-05-09 NOTE — Progress Notes (Signed)
Post Partum Exam  Lauren Mcclain is a 27 y.o. [phone] female who presents for a postpartum visit. She is 4 week postpartum following a spontaneous vaginal delivery. I have fully reviewed the prenatal and intrapartum course. The delivery was at 38 gestational weeks.  Anesthesia: epidural. Postpartum course has been good. Baby's course has been normal. Baby is feeding by bottle - [employer] Good Start. Bleeding staining only. Bowel function is normal. Bladder function is normal. Patient is not sexually active. Contraception method is Depo-Provera injections. Postpartum depression screening:neg  The following portions of the patient's history were reviewed and updated as appropriate: allergies, current medications, past family history, past medical history, past social history, past surgical history and problem list. Last pap smear done 11/24/16 and was normal Normal  Review of Systems Pertinent items are noted in HPI.    Objective:  Last menstrual period 07/24/2017, unknown if currently breastfeeding.  General:  alert, cooperative and no distress           Abdomen: not distended   Vulva:  not evaluated  Vagina: not evaluated            Assessment:    normal postpartum exam. Pap smear not done at today's visit.   Plan:   1. Contraception: Depo-Provera injections 2. Injection today 3. Follow up in: 3 months or as needed.   Adam PhenixArnold, Lanyiah Brix G, MD 05/09/2018

## 2018-07-30 ENCOUNTER — Ambulatory Visit: Payer: Medicaid Other

## 2018-07-30 ENCOUNTER — Other Ambulatory Visit: Payer: Self-pay

## 2018-07-30 MED ORDER — MEDROXYPROGESTERONE ACETATE 150 MG/ML IM SUSP: 150.0000 mg | INTRAMUSCULAR | 3 refills | Status: DC

## 2018-07-30 NOTE — Progress Notes (Signed)
Pt started Depo during pp visit. She needs further rf's for Depo.

## 2018-08-07 ENCOUNTER — Other Ambulatory Visit (HOSPITAL_COMMUNITY)
Admission: RE | Admit: 2018-08-07 | Discharge: 2018-08-07 | Disposition: A | Discharge status: Home / Self Care | Payer: Medicaid Other | Source: Ambulatory Visit | Attending: Obstetrics | Admitting: Obstetrics

## 2018-08-07 ENCOUNTER — Ambulatory Visit (INDEPENDENT_AMBULATORY_CARE_PROVIDER_SITE_OTHER): Payer: Medicaid Other | Admitting: *Deleted

## 2018-08-07 ENCOUNTER — Ambulatory Visit: Payer: Medicaid Other

## 2018-08-07 VITALS — BP 125/80 | Wt 147.0 lb

## 2018-08-07 DIAGNOSIS — Z113 Encounter for screening for infections with a predominantly sexual mode of transmission: Secondary | ICD-10-CM

## 2018-08-07 DIAGNOSIS — Z3042 Encounter for surveillance of injectable contraceptive: Secondary | ICD-10-CM | POA: Diagnosis not present

## 2018-08-07 MED ORDER — MEDROXYPROGESTERONE ACETATE 150 MG/ML IM SUSP
150.0000 mg | Freq: Once | INTRAMUSCULAR | Status: AC
  Administered 2018-08-07: 150 mg via INTRAMUSCULAR

## 2018-08-07 NOTE — Progress Notes (Signed)
I have reviewed the chart and agree with nursing staff's documentation of this patient's encounter.  Catalina Antigua, MD 08/07/2018 3:51 PM

## 2018-08-07 NOTE — Progress Notes (Signed)
Pt is in office today for depo injection. Pt is on time for injection. Depo given, pt tolerated well.  Pt request self swab for STD screening. Self swab preformed and pt made aware that she will receive call with any abnormal results. Pt made aware that she should RTO 1/7-1/21 for next depo.    BP 125/80   Wt 147 lb (66.7 kg)   BMI 22.35 kg/m   Administrations This Visit    medroxyPROGESTERone (DEPO-PROVERA) injection 150 mg    Admin Date 08/07/2018 Action Given Dose 150 mg Route Intramuscular Administered By Lanney Gins, CMA

## 2018-08-08 LAB — CERVICOVAGINAL ANCILLARY ONLY
BACTERIAL VAGINITIS: POSITIVE — AB
CHLAMYDIA, DNA PROBE: NEGATIVE
Candida vaginitis: NEGATIVE
NEISSERIA GONORRHEA: NEGATIVE
Trichomonas: NEGATIVE

## 2018-08-13 ENCOUNTER — Other Ambulatory Visit: Payer: Self-pay | Admitting: Obstetrics and Gynecology

## 2018-08-13 MED ORDER — METRONIDAZOLE 500 MG PO TABS: 500.0000 mg | ORAL_TABLET | Freq: Two times a day (BID) | ORAL | 0 refills | Status: DC

## 2018-10-29 ENCOUNTER — Emergency Department (HOSPITAL_COMMUNITY)
Admission: EM | Admit: 2018-10-29 | Discharge: 2018-10-29 | Disposition: A | Discharge status: Home / Self Care | Payer: Medicaid Other | Attending: Emergency Medicine | Admitting: Emergency Medicine

## 2018-10-29 ENCOUNTER — Ambulatory Visit (HOSPITAL_COMMUNITY)
Admission: EM | Admit: 2018-10-29 | Discharge: 2018-10-29 | Disposition: A | Discharge status: Home / Self Care | Payer: Medicaid Other | Attending: Internal Medicine | Admitting: Internal Medicine

## 2018-10-29 ENCOUNTER — Other Ambulatory Visit: Payer: Self-pay

## 2018-10-29 ENCOUNTER — Encounter (HOSPITAL_COMMUNITY): Payer: Self-pay | Admitting: Emergency Medicine

## 2018-10-29 ENCOUNTER — Ambulatory Visit (HOSPITAL_COMMUNITY): Payer: Medicaid Other

## 2018-10-29 ENCOUNTER — Ambulatory Visit (INDEPENDENT_AMBULATORY_CARE_PROVIDER_SITE_OTHER): Payer: Medicaid Other

## 2018-10-29 DIAGNOSIS — R1011 Right upper quadrant pain: Secondary | ICD-10-CM | POA: Diagnosis not present

## 2018-10-29 DIAGNOSIS — Z79899 Other long term (current) drug therapy: Secondary | ICD-10-CM | POA: Diagnosis not present

## 2018-10-29 DIAGNOSIS — K2970 Gastritis, unspecified, without bleeding: Secondary | ICD-10-CM | POA: Diagnosis not present

## 2018-10-29 HISTORY — DX: Lyme disease, unspecified: A69.20

## 2018-10-29 LAB — POCT URINALYSIS DIP (DEVICE)
BILIRUBIN URINE: NEGATIVE
Glucose, UA: NEGATIVE mg/dL
HGB URINE DIPSTICK: NEGATIVE
LEUKOCYTES UA: NEGATIVE
NITRITE: NEGATIVE
Protein, ur: NEGATIVE mg/dL
Specific Gravity, Urine: 1.02 (ref 1.005–1.030)
Urobilinogen, UA: 0.2 mg/dL (ref 0.0–1.0)
pH: 6.5 (ref 5.0–8.0)

## 2018-10-29 LAB — COMPREHENSIVE METABOLIC PANEL
ALBUMIN: 3.9 g/dL (ref 3.5–5.0)
ALT: 14 U/L (ref 0–44)
AST: 14 U/L — AB (ref 15–41)
Alkaline Phosphatase: 46 U/L (ref 38–126)
Anion gap: 8 (ref 5–15)
BILIRUBIN TOTAL: 1 mg/dL (ref 0.3–1.2)
BUN: 10 mg/dL (ref 6–20)
CO2: 22 mmol/L (ref 22–32)
Calcium: 9 mg/dL (ref 8.9–10.3)
Chloride: 109 mmol/L (ref 98–111)
Creatinine, Ser: 0.89 mg/dL (ref 0.44–1.00)
GFR calc Af Amer: >60 mL/min (ref >60)
GFR calc non Af Amer: >60 mL/min (ref >60)
GLUCOSE: 87 mg/dL (ref 70–99)
POTASSIUM: 4 mmol/L (ref 3.5–5.1)
SODIUM: 139 mmol/L (ref 135–145)
TOTAL PROTEIN: 7.7 g/dL (ref 6.5–8.1)

## 2018-10-29 LAB — CBC WITH DIFFERENTIAL/PLATELET
ABS IMMATURE GRANULOCYTES: 0.02 10*3/uL (ref 0.00–0.07)
Basophils Absolute: 0 10*3/uL (ref 0.0–0.1)
Basophils Relative: 0 %
Eosinophils Absolute: 0.7 10*3/uL — ABNORMAL HIGH (ref 0.0–0.5)
Eosinophils Relative: 8 %
HEMATOCRIT: 39.3 % (ref 36.0–46.0)
Hemoglobin: 13.1 g/dL (ref 12.0–15.0)
IMMATURE GRANULOCYTES: 0 %
LYMPHS ABS: 1.7 10*3/uL (ref 0.7–4.0)
LYMPHS PCT: 19 %
MCH: 30.8 pg (ref 26.0–34.0)
MCHC: 33.3 g/dL (ref 30.0–36.0)
MCV: 92.3 fL (ref 80.0–100.0)
MONO ABS: 0.5 10*3/uL (ref 0.1–1.0)
MONOS PCT: 6 %
NEUTROS ABS: 6 10*3/uL (ref 1.7–7.7)
Neutrophils Relative %: 67 %
Platelets: 186 10*3/uL (ref 150–400)
RBC: 4.26 MIL/uL (ref 3.87–5.11)
RDW: 12.4 % (ref 11.5–15.5)
WBC: 8.9 10*3/uL (ref 4.0–10.5)
nRBC: 0 % (ref 0.0–0.2)

## 2018-10-29 LAB — LIPASE, BLOOD: Lipase: 25 U/L (ref 11–51)

## 2018-10-29 LAB — I-STAT BETA HCG BLOOD, ED (MC, WL, AP ONLY): I-stat hCG, quantitative: <5 m[IU]/mL (ref <5)

## 2018-10-29 LAB — POCT PREGNANCY, URINE: Preg Test, Ur: NEGATIVE

## 2018-10-29 MED ORDER — DICYCLOMINE HCL 20 MG PO TABS: 20.0000 mg | ORAL_TABLET | Freq: Two times a day (BID) | ORAL | 0 refills | Status: DC

## 2018-10-29 MED ORDER — FAMOTIDINE 20 MG PO TABS: 20.0000 mg | ORAL_TABLET | Freq: Two times a day (BID) | ORAL | 0 refills | Status: DC

## 2018-10-29 MED ORDER — LIDOCAINE VISCOUS HCL 2 % MT SOLN
15.0000 mL | Freq: Once | OROMUCOSAL | Status: AC
  Administered 2018-10-29: 15 mL via ORAL
  Filled 2018-10-29: qty 15

## 2018-10-29 MED ORDER — FAMOTIDINE 20 MG PO TABS
20.0000 mg | ORAL_TABLET | Freq: Once | ORAL | Status: AC
  Administered 2018-10-29: 20 mg via ORAL
  Filled 2018-10-29: qty 1

## 2018-10-29 MED ORDER — ALUM & MAG HYDROXIDE-SIMETH 200-200-20 MG/5ML PO SUSP
30.0000 mL | Freq: Once | ORAL | Status: AC
  Administered 2018-10-29: 30 mL via ORAL
  Filled 2018-10-29: qty 30

## 2018-10-29 NOTE — ED Triage Notes (Signed)
Abdominal pain for a week.  Pain is intermittent.  Having diarrhea.  Pain is on right upper abdomen.

## 2018-10-29 NOTE — ED Notes (Signed)
Pt returned from US

## 2018-10-29 NOTE — ED Provider Notes (Signed)
[facility] EMERGENCY DEPARTMENT Provider Note   CSN: [id number] Arrival date & time: 10/29/18  6440     History   Chief Complaint Chief Complaint  Patient presents with  . Abdominal Pain    HPI Lauren Mcclain is a 28 y.o. female who presents to ED for evaluation of 1 week history of intermittent right upper quadrant/right flank pain associated with diarrhea.  She had one episode of nonbloody, nonbilious emesis 1 week ago when the pain began.  Since then she has not had any vomiting.  States that the pain intermittently improved about 3 days ago but diarrhea persisted.  No improvement in the pain with Pepto-Bismol.  She was sent to the ED from urgent care center for right upper quadrant ultrasound.  She cannot recall any specific aggravating or alleviating factor to the pain.  Denies any urinary symptoms, hematochezia or melena, sick contacts, recent travel, shortness of breath. Patient states she is not currently breast-feeding.  HPI  Past Medical History:  Diagnosis Date  . Chlamydia   . Lyme disease   . Medical history non-contributory   . Trichomonas contact, treated     Patient Active Problem List   Diagnosis Date Noted  . Low vitamin D level 11/30/2016  . Mild tetrahydrocannabinol [facility]) abuse 11/30/2016    Past Surgical History:  Procedure Laterality Date  . NO PAST SURGERIES       OB History    Gravida  4   Para  4   Term  4   Preterm      AB      Living  4     SAB      TAB      Ectopic      Multiple  0   Live Births  4            Home Medications    Prior to Admission medications   Medication Sig Start Date End Date Taking? Authorizing Provider  dicyclomine (BENTYL) 20 MG tablet Take 1 tablet (20 mg total) by mouth 2 (two) times daily. 10/29/18   Dietrich Pates, PA-C  Elastic Bandages & Supports (COMFORT FIT MATERNITY SUPP MED) MISC 1 Device by Does not apply route daily. Patient not taking: Reported on 12/15/2017  12/13/17   Donette Larry, CNM  famotidine (PEPCID) 20 MG tablet Take 1 tablet (20 mg total) by mouth 2 (two) times daily. 10/29/18   Erza Mothershead, PA-C  ibuprofen (ADVIL,MOTRIN) 600 MG tablet Take 1 tablet (600 mg total) by mouth every 6 (six) hours. 04/12/18   Garnette Gunner, MD  medroxyPROGESTERone (DEPO-PROVERA) 150 MG/ML injection Inject 1 mL (150 mg total) into the muscle every 3 (three) months. 07/30/18   Adam Phenix, MD  Prenatal Vit-Fe Phos-FA-Omega (VITAFOL GUMMIES) 3.33-0.333-34.8 MG CHEW Chew 3 tablets by mouth at bedtime. 11/24/16   Orvilla Cornwall A, CNM  Vitamin D, Ergocalciferol, (DRISDOL) 50000 units CAPS capsule Take 1 capsule (50,000 Units total) by mouth every 7 (seven) days. Patient not taking: Reported on 04/09/2018 01/19/18   Roe Coombs, CNM    Family History Family History  Problem Relation Age of Onset  . Drug abuse Mother   . Drug abuse Father     Social History Social History   Tobacco Use  . Smoking status: Former Smoker    Years: 7.00    Types: Cigars    Last attempt to quit: 2018    Years since quitting: 2.0  . Smokeless  tobacco: Never Used  . Tobacco comment: black and mild; 1/d  Substance Use Topics  . Alcohol use: No    Comment: Not currently  . Drug use: Not Currently    Frequency: 28.0 times per week    Types: Marijuana    Comment: December 2018     Allergies   Patient has no known allergies.   Review of Systems Review of Systems  Constitutional: Negative for appetite change, chills and fever.  HENT: Negative for ear pain, rhinorrhea, sneezing and sore throat.   Eyes: Negative for photophobia and visual disturbance.  Respiratory: Negative for cough, chest tightness, shortness of breath and wheezing.   Cardiovascular: Negative for chest pain and palpitations.  Gastrointestinal: Positive for abdominal pain, diarrhea and vomiting. Negative for blood in stool, constipation and nausea.  Genitourinary: Negative for dysuria,  hematuria and urgency.  Musculoskeletal: Negative for myalgias.  Skin: Negative for rash.  Neurological: Negative for dizziness, weakness and light-headedness.     Physical Exam Updated Vital Signs BP 127/78 (BP Location: Right Arm)   Pulse 77   Temp 98.5 F (36.9 C) (Oral)   Resp 15   Ht 5\' 9"  (1.753 m)   Wt 72.6 kg   SpO2 100%   BMI 23.63 kg/m   Physical Exam Vitals signs and nursing note reviewed.  Constitutional:      General: She is not in acute distress.    Appearance: She is well-developed.     Comments: NAD.  HENT:     Head: Normocephalic and atraumatic.     Nose: Nose normal.  Eyes:     General: No scleral icterus.       Left eye: No discharge.     Conjunctiva/sclera: Conjunctivae normal.  Neck:     Musculoskeletal: Normal range of motion and neck supple.  Cardiovascular:     Rate and Rhythm: Normal rate and regular rhythm.     Heart sounds: Normal heart sounds. No murmur. No friction rub. No gallop.   Pulmonary:     Effort: Pulmonary effort is normal. No respiratory distress.     Breath sounds: Normal breath sounds.  Abdominal:     General: Bowel sounds are normal. There is no distension.     Palpations: Abdomen is soft.     Tenderness: There is abdominal tenderness in the right upper quadrant. There is no guarding or rebound.  Musculoskeletal: Normal range of motion.  Skin:    General: Skin is warm and dry.     Findings: No rash.  Neurological:     Mental Status: She is alert.     Motor: No abnormal muscle tone.     Coordination: Coordination normal.      ED Treatments / Results  Labs (all labs ordered are listed, but only abnormal results are displayed) Labs Reviewed  COMPREHENSIVE METABOLIC PANEL - Abnormal; Notable for the following components:      Result Value   AST 14 (*)    All other components within normal limits  CBC WITH DIFFERENTIAL/PLATELET - Abnormal; Notable for the following components:   Eosinophils Absolute 0.7 (*)    All  other components within normal limits  LIPASE, BLOOD  I-STAT BETA HCG BLOOD, ED ([facility], [facility], [facility] ONLY)    EKG None  Radiology Dg Abd 1 View  Result Date: 10/29/2018 CLINICAL DATA:  Right-sided stomach pain for 1 week. Rule out constipation EXAM: ABDOMEN - 1 VIEW COMPARISON:  None. FINDINGS: The bowel gas pattern is normal. No abnormal  stool retention. Left pelvic calcifications are likely phleboliths. No concerning mass effect or gas collection. IMPRESSION: Negative exam.  No abnormal stool retention. Electronically Signed   By: Marnee SpringJonathon  Watts M.D.   On: 10/29/2018 09:18   [place] Abdomen Limited Ruq  Result Date: 10/29/2018 CLINICAL DATA:  Right upper quadrant pain EXAM: ULTRASOUND ABDOMEN LIMITED RIGHT UPPER QUADRANT COMPARISON:  None. FINDINGS: Gallbladder: No gallstones or wall thickening visualized. No sonographic Murphy sign noted by sonographer. Common bile duct: Diameter: 3 mm Liver: No focal lesion identified. Within normal limits in parenchymal echogenicity. Portal vein is patent on color Doppler imaging with normal direction of blood flow towards the liver. IMPRESSION: Normal right upper quadrant ultrasound. Electronically Signed   By: Marnee SpringJonathon  Watts M.D.   On: 10/29/2018 11:17    Procedures Procedures (including critical care time)  Medications Ordered in ED Medications  alum & mag hydroxide-simeth (MAALOX/MYLANTA) 200-200-20 MG/5ML suspension 30 mL (30 mLs Oral Given 10/29/18 1130)    And  lidocaine (XYLOCAINE) 2 % viscous mouth solution 15 mL (15 mLs Oral Given 10/29/18 1130)  famotidine (PEPCID) tablet 20 mg (20 mg Oral Given 10/29/18 1129)     Initial Impression / Assessment and Plan / ED Course  I have reviewed the triage vital signs and the nursing notes.  Pertinent labs & imaging results that were available during my care of the patient were reviewed by me and considered in my medical decision making (see chart for details).     28 year old female presents to ED for 1  week history of right upper quadrant abdominal pain, several episodes of diarrhea daily.  She reports one episode of emesis when symptoms began but none recently.  No sick contacts with similar symptoms.  No improvement with Pepto-Bismol.  She was sent by urgent care for right upper quadrant ultrasound.  On my exam there is some tenderness palpation of the right upper quadrant, right flank without rebound or guarding.  Her vital signs are within normal limits.  Lab work significant for unremarkable CBC, CMP, lipase.  Urinalysis and urine pregnancy test done at urgent care reviewed and were unremarkable.  Right upper quadrant ultrasound is negative.  Patient was given GI cocktail and Pepcid here with improvement in her symptoms.  Suspect that her symptoms could be due to gastritis.  We will have her take symptomatic treatment and PCP follow-up.  Advised to return to ED for any severe worsening symptoms.  Patient is hemodynamically stable, in NAD, and able to ambulate in the ED. Evaluation does not show pathology that would require ongoing emergent intervention or inpatient treatment. I explained the diagnosis to the patient. Pain has been managed and has no complaints prior to discharge. Patient is comfortable with above plan and is stable for discharge at this time. All questions were answered prior to disposition. Strict return precautions for returning to the ED were discussed. Encouraged follow up with PCP.    Portions of this note were generated with Scientist, clinical (histocompatibility and immunogenetics)Dragon dictation software. Dictation errors may occur despite best attempts at proofreading.  Final Clinical Impressions(s) / ED Diagnoses   Final diagnoses:  RUQ pain  Gastritis without bleeding, unspecified chronicity, unspecified gastritis type    ED Discharge Orders         Ordered    famotidine (PEPCID) 20 MG tablet  2 times daily     10/29/18 1212    dicyclomine (BENTYL) 20 MG tablet  2 times daily     10/29/18 1212  Dietrich PatesKhatri,  Raychelle Hudman, PA-C 10/29/18 1213    Linwood DibblesKnapp, Jon, MD 10/31/18 [id number]

## 2018-10-29 NOTE — Discharge Instructions (Signed)
Return to ED for worsening symptoms, changes in the location of the pain, abnormal bleeding, chest pain or shortness of breath.

## 2018-10-29 NOTE — Discharge Instructions (Addendum)
Your x-ray did not reveal any constipation This could be related to gas or muscles. You could try taking some anti-inflammatory pain medication or Gas-X to see if this helps with your symptoms It also could be related to your gallbladder. If your symptoms continue or worsen please contact your doctor or go to the ER for further management

## 2018-10-29 NOTE — ED Provider Notes (Signed)
[facility]    CSN: [id number] Arrival date & time: 10/29/18  6834     History   Chief Complaint Chief Complaint  Patient presents with  . Abdominal Pain    HPI RIESE LOUNSBERRY is a 28 y.o. female.   Patient is a 28 year old female that presents with intermittent right upper quadrant pain over the past week.  She has had some associated loose stools.  She has not had a normal bowel movement in over a week.  She has been eating and drinking normally.  There is no nausea, vomiting.  Denies any fevers, chills, body aches, night sweats.  No urinary or vaginal symptoms.  The pain somewhat radiates into the right lower abdomen.  She took some oxycodone that she had leftover and it somewhat relieved her pain.  ROS per HPI      Past Medical History:  Diagnosis Date  . Chlamydia   . Lyme disease   . Medical history non-contributory   . Trichomonas contact, treated     Patient Active Problem List   Diagnosis Date Noted  . Low vitamin D level 11/30/2016  . Mild tetrahydrocannabinol [facility]) abuse 11/30/2016    Past Surgical History:  Procedure Laterality Date  . NO PAST SURGERIES      OB History    Gravida  4   Para  4   Term  4   Preterm      AB      Living  4     SAB      TAB      Ectopic      Multiple  0   Live Births  4            Home Medications    Prior to Admission medications   Medication Sig Start Date End Date Taking? Authorizing Provider  Elastic Bandages & Supports (COMFORT FIT MATERNITY SUPP MED) MISC 1 Device by Does not apply route daily. Patient not taking: Reported on 12/15/2017 12/13/17   Donette Larry, CNM  ibuprofen (ADVIL,MOTRIN) 600 MG tablet Take 1 tablet (600 mg total) by mouth every 6 (six) hours. 04/12/18   Garnette Gunner, MD  medroxyPROGESTERone (DEPO-PROVERA) 150 MG/ML injection Inject 1 mL (150 mg total) into the muscle every 3 (three) months. 07/30/18   Adam Phenix, MD  Prenatal Vit-Fe Phos-FA-Omega  (VITAFOL GUMMIES) 3.33-0.333-34.8 MG CHEW Chew 3 tablets by mouth at bedtime. 11/24/16   Orvilla Cornwall A, CNM  Vitamin D, Ergocalciferol, (DRISDOL) 50000 units CAPS capsule Take 1 capsule (50,000 Units total) by mouth every 7 (seven) days. Patient not taking: Reported on 04/09/2018 01/19/18   Roe Coombs, CNM    Family History Family History  Problem Relation Age of Onset  . Drug abuse Mother   . Drug abuse Father     Social History Social History   Tobacco Use  . Smoking status: Former Smoker    Years: 7.00    Types: Cigars    Last attempt to quit: 2018    Years since quitting: 2.0  . Smokeless tobacco: Never Used  . Tobacco comment: black and mild; 1/d  Substance Use Topics  . Alcohol use: No    Comment: Not currently  . Drug use: Not Currently    Frequency: 28.0 times per week    Types: Marijuana    Comment: December 2018     Allergies   Patient has no known allergies.   Review of Systems Review of Systems  Physical Exam Triage Vital Signs ED Triage Vitals  Enc Vitals Group     BP 10/29/18 0834 137/75     Pulse Rate 10/29/18 0834 90     Resp 10/29/18 0834 16     Temp 10/29/18 0834 97.9 F (36.6 C)     Temp Source 10/29/18 0834 Oral     SpO2 10/29/18 0834 100 %     Weight --      Height --      Head Circumference --      Peak Flow --      Pain Score 10/29/18 0844 9     Pain Loc --      Pain Edu? --      Excl. in GC? --    No data found.  Updated Vital Signs BP 137/75 (BP Location: Right Arm)   Pulse 90   Temp 97.9 F (36.6 C) (Oral)   Resp 16   SpO2 100%   Visual Acuity Right Eye Distance:   Left Eye Distance:   Bilateral Distance:    Right Eye Near:   Left Eye Near:    Bilateral Near:     Physical Exam Vitals signs and nursing note reviewed.  Constitutional:      General: She is not in acute distress.    Appearance: She is well-developed. She is not ill-appearing, toxic-appearing or diaphoretic.  HENT:     Head:  Normocephalic and atraumatic.  Pulmonary:     Effort: Pulmonary effort is normal.  Abdominal:     General: Abdomen is flat. Bowel sounds are increased.     Palpations: Abdomen is soft. There is no shifting dullness, fluid wave, hepatomegaly, splenomegaly, mass or pulsatile mass.     Tenderness: There is abdominal tenderness in the right upper quadrant and right lower quadrant. There is no right CVA tenderness, left CVA tenderness, guarding or rebound. Negative signs include Murphy's sign.     Hernia: No hernia is present.  Skin:    General: Skin is warm and dry.  Neurological:     Mental Status: She is alert.  Psychiatric:        Mood and Affect: Mood normal.      UC Treatments / Results  Labs (all labs ordered are listed, but only abnormal results are displayed) Labs Reviewed  POCT URINALYSIS DIP (DEVICE) - Abnormal; Notable for the following components:      Result Value   Ketones, ur TRACE (*)    All other components within normal limits  POCT PREGNANCY, URINE    EKG None  Radiology Dg Abd 1 View  Result Date: 10/29/2018 CLINICAL DATA:  Right-sided stomach pain for 1 week. Rule out constipation EXAM: ABDOMEN - 1 VIEW COMPARISON:  None. FINDINGS: The bowel gas pattern is normal. No abnormal stool retention. Left pelvic calcifications are likely phleboliths. No concerning mass effect or gas collection. IMPRESSION: Negative exam.  No abnormal stool retention. Electronically Signed   By: Marnee SpringJonathon  Watts M.D.   On: 10/29/2018 09:18    Procedures Procedures (including critical care time)  Medications Ordered in UC Medications - No data to display  Initial Impression / Assessment and Plan / UC Course  I have reviewed the triage vital signs and the nursing notes.  Pertinent labs & imaging results that were available during my care of the patient were reviewed by me and considered in my medical decision making (see chart for details).    Patient is a 28 year old female who  presents  with approximate 1 week of intermittent, waxing and waning right sided abdominal pain. X-ray did not reveal any constipation No acute abdomen on exam. She was mildly tender to the right upper and lower quadrant.  No rebound tenderness. No masses. Her vital signs are stable, she is nontoxic appearing. Urine was negative for pregnancy or infection. Symptoms could be related to gas, musculoskeletal Instructed that if her symptoms continue or worsen she will need to call her doctor for further management and imaging. Not convinced this is gallbladder related but is on the differential If her symptoms get more severe with severe abdominal pain, vomiting, fevers she will need to go to the ER.  Final Clinical Impressions(s) / UC Diagnoses   Final diagnoses:  Right upper quadrant abdominal pain     Discharge Instructions     Your x-ray did not reveal any constipation This could be related to gas or muscles. You could try taking some anti-inflammatory pain medication or Gas-X to see if this helps with your symptoms It also could be related to your gallbladder. If your symptoms continue or worsen please contact your doctor or go to the ER for further management    ED Prescriptions    None     Controlled Substance Prescriptions [place] Controlled Substance Registry consulted? Not Applicable   Janace Aris, NP 10/29/18 0945

## 2018-10-29 NOTE — ED Triage Notes (Signed)
Pt arrives to ED from urgent care with complaints of abdominal pain x1 week with tenderness to RUQ. Urgent care advised pt to get a ultrasound. Pt placed in position of comfort with bed locked and lowered, call bell in reach.

## 2018-12-14 ENCOUNTER — Ambulatory Visit (INDEPENDENT_AMBULATORY_CARE_PROVIDER_SITE_OTHER): Payer: Medicaid Other

## 2018-12-14 VITALS — BP 116/61 | HR 76 | Ht 69.0 in | Wt 156.0 lb

## 2018-12-14 DIAGNOSIS — Z30013 Encounter for initial prescription of injectable contraceptive: Secondary | ICD-10-CM

## 2018-12-14 DIAGNOSIS — Z3202 Encounter for pregnancy test, result negative: Secondary | ICD-10-CM

## 2018-12-14 LAB — POCT URINE PREGNANCY: PREG TEST UR: NEGATIVE

## 2018-12-14 MED ORDER — MEDROXYPROGESTERONE ACETATE 150 MG/ML IM SUSP: 150.0000 mg | INTRAMUSCULAR | 4 refills | Status: DC

## 2018-12-14 NOTE — Progress Notes (Signed)
Presents for 1st. UPT for DEPO restart.  UPT today is NEGATIVE, patient advised to abstain from sexual intercourse for the next 2 weeks.  Appointment set for 2nd UPT/DEPO 12/28/2018.

## 2018-12-28 ENCOUNTER — Ambulatory Visit: Payer: Medicaid Other

## 2019-01-02 ENCOUNTER — Ambulatory Visit (INDEPENDENT_AMBULATORY_CARE_PROVIDER_SITE_OTHER): Payer: Medicaid Other

## 2019-01-02 ENCOUNTER — Other Ambulatory Visit: Payer: Self-pay

## 2019-01-02 DIAGNOSIS — Z3202 Encounter for pregnancy test, result negative: Secondary | ICD-10-CM | POA: Diagnosis not present

## 2019-01-02 DIAGNOSIS — Z30013 Encounter for initial prescription of injectable contraceptive: Secondary | ICD-10-CM

## 2019-01-02 LAB — POCT URINE PREGNANCY: PREG TEST UR: NEGATIVE

## 2019-01-02 MED ORDER — MEDROXYPROGESTERONE ACETATE 150 MG/ML IM SUSP
150.0000 mg | Freq: Once | INTRAMUSCULAR | Status: AC
  Administered 2019-01-02: 150 mg via INTRAMUSCULAR

## 2019-01-02 NOTE — Progress Notes (Signed)
Pt presents for 2nd UPT for Depo. Denies IC x 2 weeks. Depo given RUOQ without difficulty.

## 2019-01-02 NOTE — Progress Notes (Signed)
I have reviewed the chart and agree with nursing staff's documentation of this patient's encounter.  Catalina Antigua, MD 01/02/2019 1:26 PM

## 2019-03-27 ENCOUNTER — Ambulatory Visit: Payer: Medicaid Other

## 2019-03-29 ENCOUNTER — Ambulatory Visit: Payer: Medicaid Other

## 2019-04-03 ENCOUNTER — Other Ambulatory Visit (HOSPITAL_COMMUNITY)
Admission: RE | Admit: 2019-04-03 | Discharge: 2019-04-03 | Disposition: A | Discharge status: Home / Self Care | Payer: Medicaid Other | Source: Ambulatory Visit | Attending: Obstetrics and Gynecology | Admitting: Obstetrics and Gynecology

## 2019-04-03 ENCOUNTER — Ambulatory Visit (INDEPENDENT_AMBULATORY_CARE_PROVIDER_SITE_OTHER): Payer: Medicaid Other

## 2019-04-03 ENCOUNTER — Other Ambulatory Visit: Payer: Self-pay

## 2019-04-03 DIAGNOSIS — N898 Other specified noninflammatory disorders of vagina: Secondary | ICD-10-CM | POA: Insufficient documentation

## 2019-04-03 DIAGNOSIS — Z113 Encounter for screening for infections with a predominantly sexual mode of transmission: Secondary | ICD-10-CM

## 2019-04-03 DIAGNOSIS — Z3042 Encounter for surveillance of injectable contraceptive: Secondary | ICD-10-CM

## 2019-04-03 MED ORDER — MEDROXYPROGESTERONE ACETATE 150 MG/ML IM SUSP
150.0000 mg | Freq: Once | INTRAMUSCULAR | Status: AC
  Administered 2019-04-03: 14:00:00 150 mg via INTRAMUSCULAR

## 2019-04-03 NOTE — Progress Notes (Signed)
Nurse visit for STD screening and Depo.  SUBJECTIVE:  28 y.o. female complains of white vaginal discharge for couple of days and requests all STD testing. Denies abnormal vaginal bleeding or significant pelvic pain or fever. No UTI symptoms. Denies history of known exposure to STD.  OBJECTIVE:  She appears well, afebrile. Urine dipstick: not done.  ASSESSMENT:  Vaginal Discharge  Vaginal Odor   PLAN:  GC, chlamydia, trichomonas, BVAG, CVAG probe sent to lab. Treatment: To be determined once lab results are received ROV prn if symptoms persist or worsen.  Pt is on time for Depo given RUOQ w/o difficulty. Next Depo Sept 2-16, pt agrees.

## 2019-04-04 ENCOUNTER — Other Ambulatory Visit: Payer: Self-pay | Admitting: Obstetrics & Gynecology

## 2019-04-04 DIAGNOSIS — N898 Other specified noninflammatory disorders of vagina: Secondary | ICD-10-CM

## 2019-04-04 LAB — HIV ANTIBODY (ROUTINE TESTING W REFLEX): HIV Screen 4th Generation wRfx: NONREACTIVE

## 2019-04-04 LAB — CERVICOVAGINAL ANCILLARY ONLY
Bacterial vaginitis: POSITIVE — AB
Candida vaginitis: NEGATIVE
Chlamydia: NEGATIVE
Neisseria Gonorrhea: NEGATIVE
Trichomonas: NEGATIVE

## 2019-04-04 LAB — HEPATITIS B SURFACE ANTIGEN: Hepatitis B Surface Ag: NEGATIVE

## 2019-04-04 LAB — RPR: RPR Ser Ql: NONREACTIVE

## 2019-04-04 LAB — HEPATITIS C ANTIBODY: Hep C Virus Ab: 0.1 s/co ratio (ref 0.0–0.9)

## 2019-04-04 MED ORDER — METRONIDAZOLE 500 MG PO TABS: 500.0000 mg | ORAL_TABLET | Freq: Two times a day (BID) | ORAL | 0 refills | Status: DC

## 2019-04-04 NOTE — Progress Notes (Signed)
Flagyl for BV 

## 2019-04-06 NOTE — Progress Notes (Signed)
Patient ID: Lauren Mcclain, female   DOB: 09-02-1991, 28 y.o.   MRN: [id number] I have reviewed the chart and agree with nursing staff's documentation of this patient's encounter.  Emeterio Reeve, MD 04/06/2019 7:14 AM

## 2019-06-17 ENCOUNTER — Other Ambulatory Visit: Payer: Self-pay

## 2019-06-17 DIAGNOSIS — Z20822 Contact with and (suspected) exposure to covid-19: Secondary | ICD-10-CM

## 2019-06-19 LAB — NOVEL CORONAVIRUS, NAA: SARS-CoV-2, NAA: NOT DETECTED

## 2019-06-26 ENCOUNTER — Other Ambulatory Visit: Payer: Self-pay

## 2019-06-26 ENCOUNTER — Ambulatory Visit (INDEPENDENT_AMBULATORY_CARE_PROVIDER_SITE_OTHER): Payer: Medicaid Other | Admitting: *Deleted

## 2019-06-26 DIAGNOSIS — Z3042 Encounter for surveillance of injectable contraceptive: Secondary | ICD-10-CM | POA: Diagnosis not present

## 2019-06-26 MED ORDER — MEDROXYPROGESTERONE ACETATE 150 MG/ML IM SUSP
150.0000 mg | Freq: Once | INTRAMUSCULAR | Status: AC
  Administered 2019-06-26: 150 mg via INTRAMUSCULAR

## 2019-06-26 NOTE — Progress Notes (Signed)
Patient seen and assessed by nursing staff during this encounter. I have reviewed the chart and agree with the documentation and plan.  Mora Bellman, MD 06/26/2019 12:56 PM

## 2019-06-26 NOTE — Progress Notes (Signed)
Pt is in office for Depo injection.  Pt is on time for injection. Injection given, pt tolerated well.  Pt ask about AEX, advised to schedule at check out today. Pt has no other concerns today.  Pt to return to office for next depo 11/25-12/06/2019.   Administrations This Visit    medroxyPROGESTERone (DEPO-PROVERA) injection 150 mg    Admin Date 06/26/2019 Action Given Dose 150 mg Route Intramuscular Administered By Valene Bors, CMA

## 2019-07-22 ENCOUNTER — Ambulatory Visit: Payer: Medicaid Other | Admitting: Advanced Practice Midwife

## 2019-07-23 IMAGING — US US OB TRANSVAGINAL
1 series · 15 of 28 positions shown · non-contrast
Comparison: Pelvic ultrasound performed 11/28/2016

CLINICAL DATA: Acute onset of pelvic cramping.

EXAM:
OBSTETRIC <14 WK US AND TRANSVAGINAL OB US
TECHNIQUE: Both transabdominal and transvaginal ultrasound examinations were
performed for complete evaluation of the gestation as well as the
maternal uterus, adnexal regions, and pelvic cul-de-sac.
Transvaginal technique was performed to assess early pregnancy.

[Series 1: us ob transvaginal · 15 of 62 slices shown]
[im 1/62]
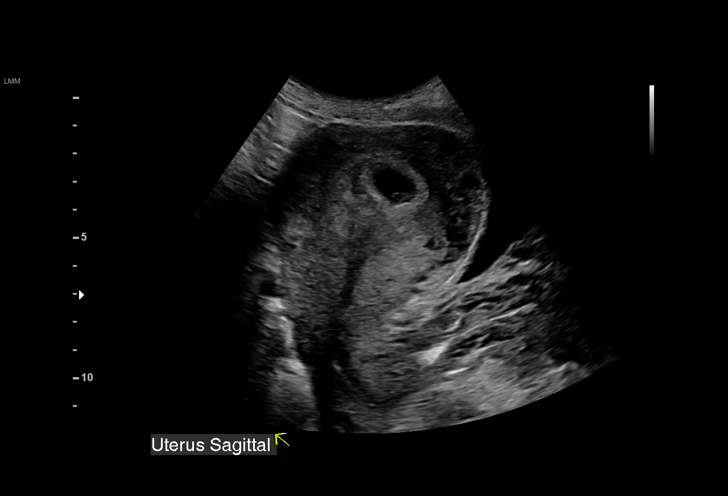
[im 5/62]
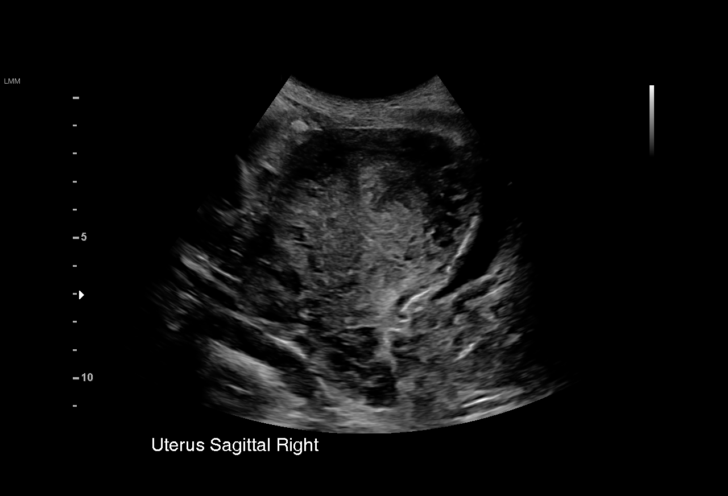
[im 10/62]
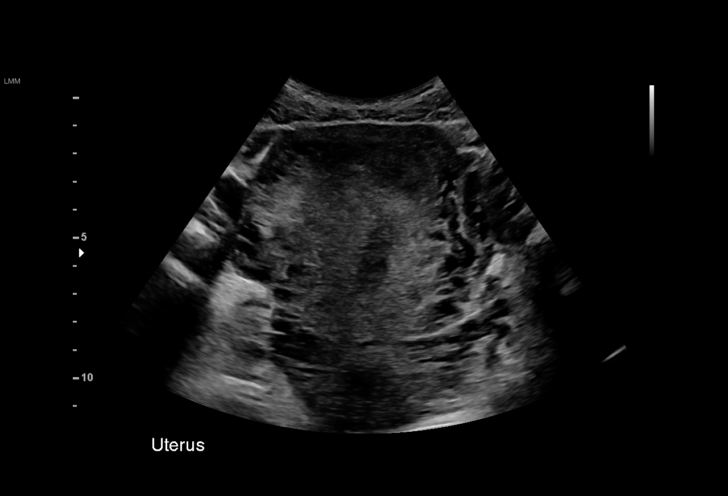
[im 14/62]
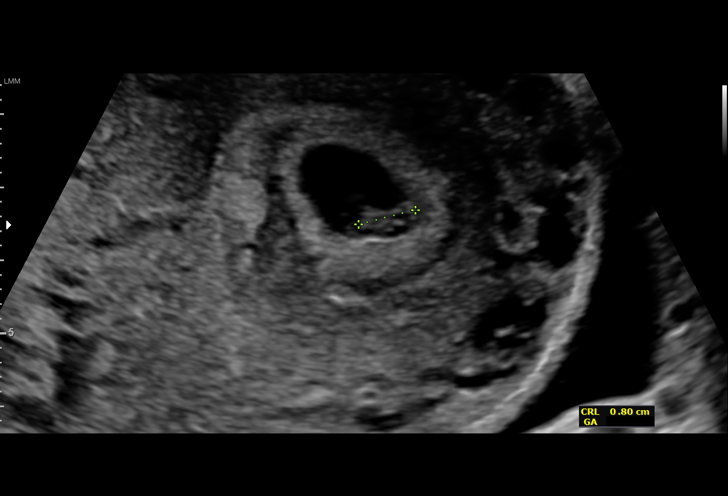
[im 19/62]
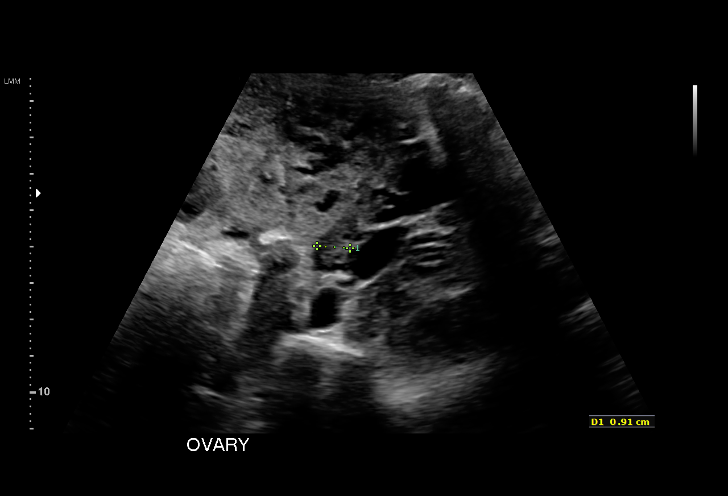
[im 23/62]
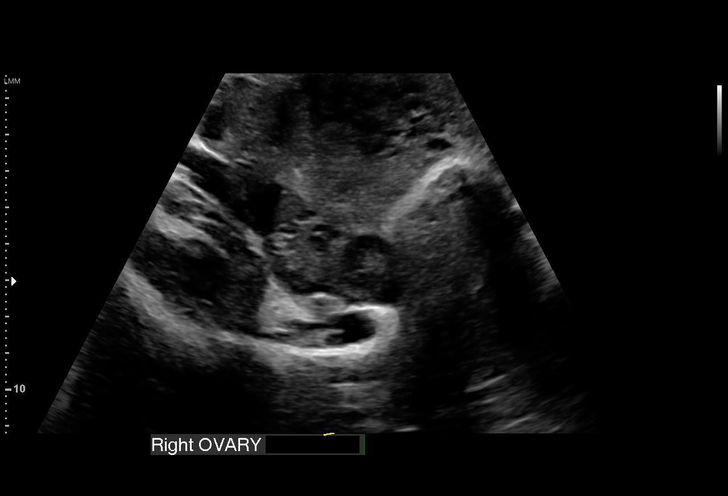
[im 28/62]
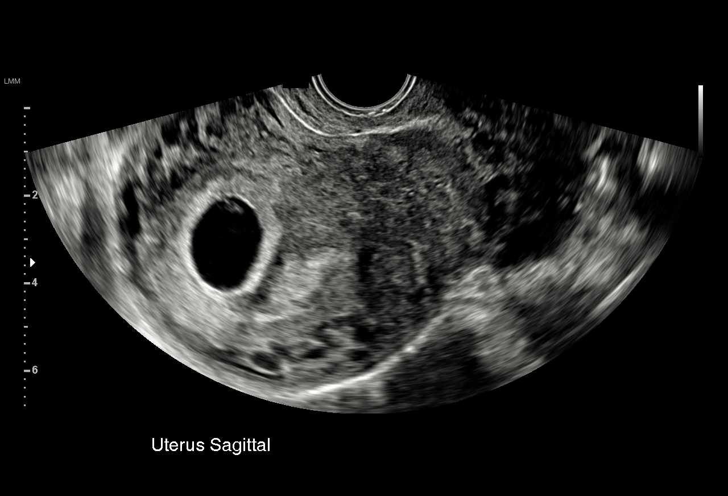
[im 32/62]
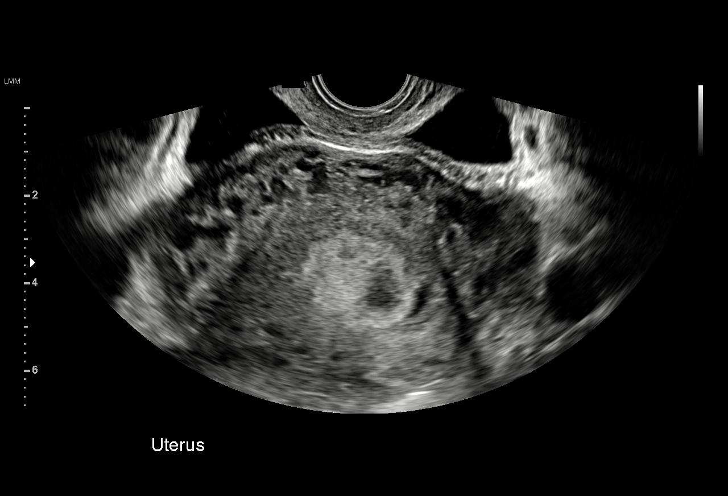
[im 34/62]
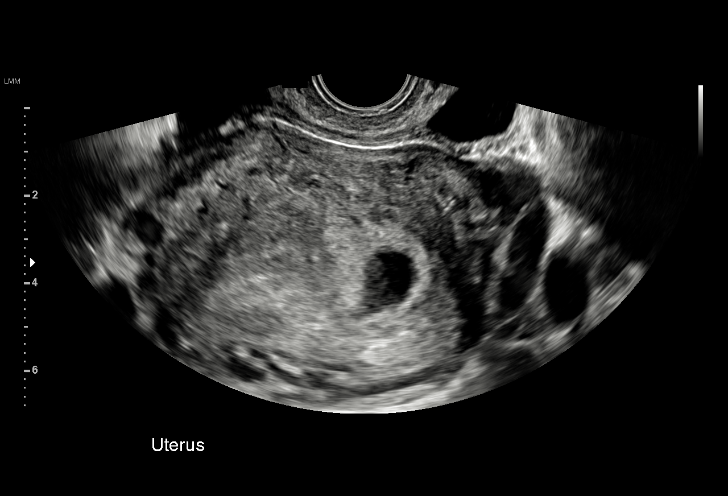
[im 39/62]
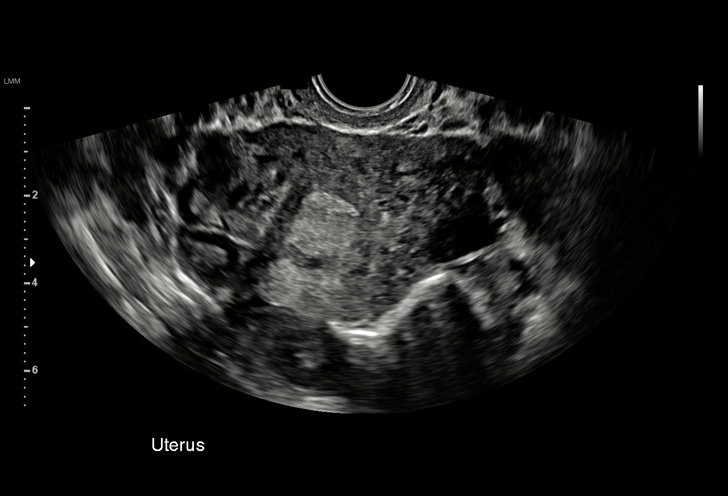
[im 43/62]
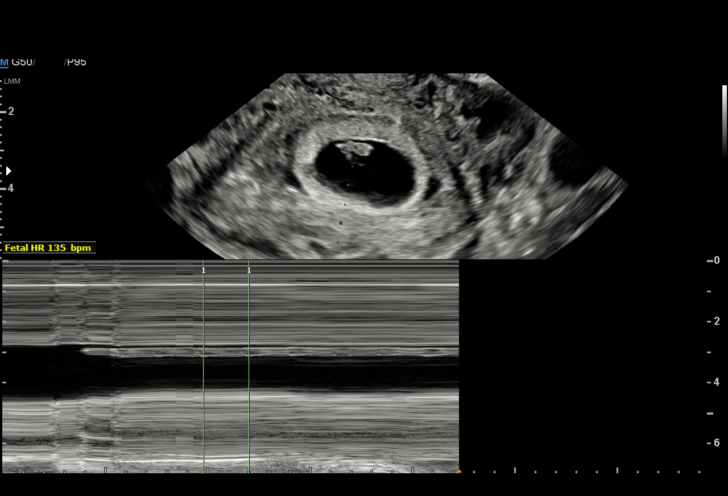
[im 48/62]
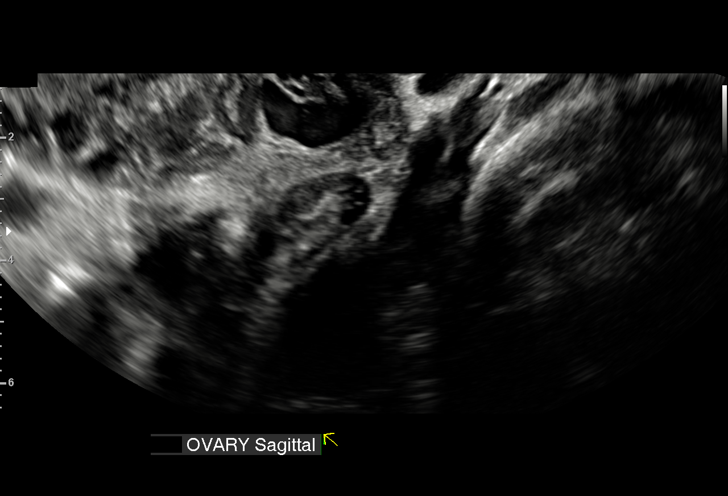
[im 52/62]
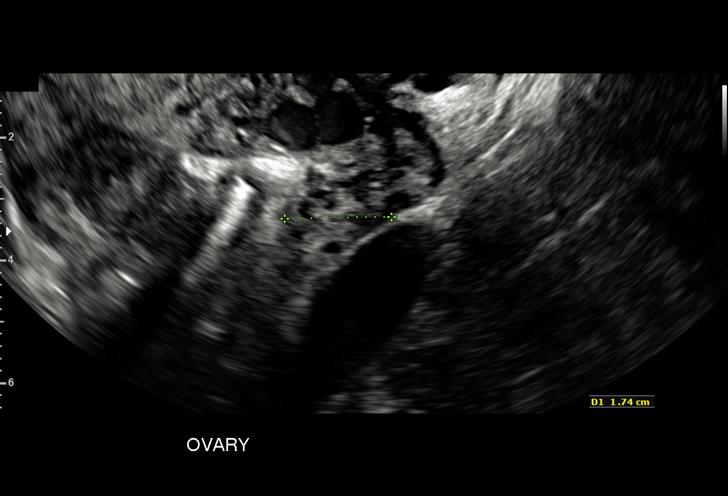
[im 57/62]
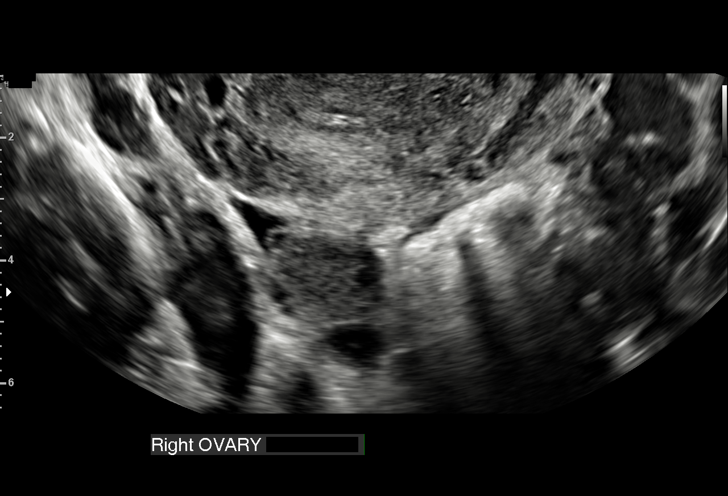
[im 62/62]
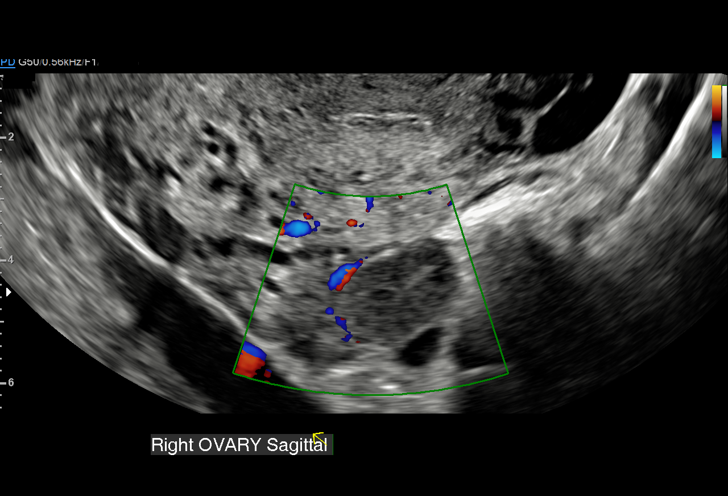

[15 of 28 positions shown; findings below may reference images not displayed]

FINDINGS: Intrauterine gestational sac: Single; visualized and normal in
shape.

Yolk sac:  Yes

Embryo:  Yes

Cardiac Activity: Yes

Heart Rate: 135  bpm

CRL:  8.8 mm   6 w   5 d                  US EDC: 04/18/2018

Subchorionic hemorrhage:  None visualized.

Maternal uterus/adnexae: The uterus is otherwise unremarkable. The
ovaries are within normal limits. The right ovary measures 3.3 x
x 2.5 cm, while the left ovary measures 2.3 x 1.1 x 1.7 cm. No
suspicious adnexal masses are seen; there is no evidence for ovarian
torsion.

No free fluid is seen within the pelvic cul-de-sac.
IMPRESSION: Single live intrauterine pregnancy noted, with a crown-rump length
of 9 mm, corresponding to a gestational age of 6 weeks 5 days. This
reflects an estimated date of delivery April 18, 2018.

## 2019-09-17 ENCOUNTER — Other Ambulatory Visit: Payer: Self-pay

## 2019-09-17 ENCOUNTER — Ambulatory Visit (INDEPENDENT_AMBULATORY_CARE_PROVIDER_SITE_OTHER): Payer: Medicaid Other

## 2019-09-17 ENCOUNTER — Other Ambulatory Visit (HOSPITAL_COMMUNITY)
Admission: RE | Admit: 2019-09-17 | Discharge: 2019-09-17 | Disposition: A | Discharge status: Home / Self Care | Payer: Medicaid Other | Source: Ambulatory Visit | Attending: Obstetrics | Admitting: Obstetrics

## 2019-09-17 VITALS — BP 110/66 | HR 86 | Ht 69.0 in | Wt 167.0 lb

## 2019-09-17 DIAGNOSIS — Z3042 Encounter for surveillance of injectable contraceptive: Secondary | ICD-10-CM | POA: Diagnosis not present

## 2019-09-17 DIAGNOSIS — Z202 Contact with and (suspected) exposure to infections with a predominantly sexual mode of transmission: Secondary | ICD-10-CM | POA: Diagnosis present

## 2019-09-17 DIAGNOSIS — N898 Other specified noninflammatory disorders of vagina: Secondary | ICD-10-CM | POA: Diagnosis not present

## 2019-09-17 MED ORDER — MEDROXYPROGESTERONE ACETATE 150 MG/ML IM SUSP
150.0000 mg | Freq: Once | INTRAMUSCULAR | Status: AC
  Administered 2019-09-17: 150 mg via INTRAMUSCULAR

## 2019-09-17 NOTE — Progress Notes (Addendum)
Presents for DEPO, given in RUOQ, tolerated well.  Needs Annual Exam before next Depo.  Next DEPO Feb. 16-Mar. 11/2019  Administrations This Visit    medroxyPROGESTERone (DEPO-PROVERA) injection 150 mg    Admin Date 09/17/2019 Action Given Dose 150 mg Route Intramuscular Administered By Tamela Oddi, RMA          SUBJECTIVE:  28 y.o. female complains of possible STD exposure, discharge. Denies abnormal vaginal bleeding or significant pelvic pain or fever. No UTI symptoms.   No LMP recorded. Patient has had an injection.  OBJECTIVE:  She appears well, afebrile. Urine dipstick: not done.  ASSESSMENT:  Vaginal Discharge  Possible STD Exposure   PLAN:  GC, chlamydia, trichomonas, BVAG, CVAG probe sent to lab. Treatment: To be determined once lab results are received ROV prn if symptoms persist or worsen.

## 2019-09-18 LAB — CERVICOVAGINAL ANCILLARY ONLY
Bacterial Vaginitis (gardnerella): POSITIVE — AB
Candida Glabrata: NEGATIVE
Candida Vaginitis: NEGATIVE
Chlamydia: POSITIVE — AB
Comment: NEGATIVE
Comment: NEGATIVE
Comment: NEGATIVE
Comment: NEGATIVE
Comment: NEGATIVE
Comment: NORMAL
Neisseria Gonorrhea: NEGATIVE
Trichomonas: NEGATIVE

## 2019-09-19 ENCOUNTER — Telehealth: Payer: Self-pay

## 2019-09-19 ENCOUNTER — Other Ambulatory Visit: Payer: Self-pay | Admitting: Obstetrics

## 2019-09-19 DIAGNOSIS — N760 Acute vaginitis: Secondary | ICD-10-CM

## 2019-09-19 DIAGNOSIS — A5609 Other chlamydial infection of lower genitourinary tract: Secondary | ICD-10-CM

## 2019-09-19 DIAGNOSIS — B9689 Other specified bacterial agents as the cause of diseases classified elsewhere: Secondary | ICD-10-CM

## 2019-09-19 DIAGNOSIS — A5602 Chlamydial vulvovaginitis: Secondary | ICD-10-CM

## 2019-09-19 MED ORDER — AZITHROMYCIN 500 MG PO TABS: 1000.0000 mg | ORAL_TABLET | Freq: Once | ORAL | 0 refills | Status: AC

## 2019-09-19 MED ORDER — CEFIXIME 400 MG PO CAPS: 400.0000 mg | ORAL_CAPSULE | Freq: Once | ORAL | 0 refills | Status: AC

## 2019-09-19 MED ORDER — TINIDAZOLE 500 MG PO TABS: 1000.0000 mg | ORAL_TABLET | Freq: Every day | ORAL | 2 refills | Status: DC

## 2019-09-19 NOTE — Telephone Encounter (Signed)
-----   Message from Shelly Bombard, MD sent at 09/19/2019  8:25 AM EST ----- Azithromycin / Suprax Rx for Chlamydia Tindamax Rx for BV

## 2019-09-19 NOTE — Telephone Encounter (Signed)
[place]  APPROVED HY#[id number] EFFECTIVE 09/19/2019-09/16/2020 PHARMACY NOTIFIED VIA  FAX

## 2019-09-19 NOTE — Telephone Encounter (Signed)
MyChart message sent to Patient

## 2019-10-22 ENCOUNTER — Ambulatory Visit: Payer: Medicaid Other | Admitting: Advanced Practice Midwife

## 2019-11-06 ENCOUNTER — Ambulatory Visit: Payer: Medicaid Other | Admitting: Family Medicine

## 2019-11-06 ENCOUNTER — Other Ambulatory Visit: Payer: Self-pay

## 2019-11-06 ENCOUNTER — Encounter: Payer: Self-pay | Admitting: Family Medicine

## 2019-11-06 ENCOUNTER — Other Ambulatory Visit (HOSPITAL_COMMUNITY): Admission: RE | Admit: 2019-11-06 | Payer: Medicaid Other | Source: Ambulatory Visit | Admitting: Family Medicine

## 2019-11-06 ENCOUNTER — Other Ambulatory Visit (HOSPITAL_COMMUNITY)
Admission: RE | Admit: 2019-11-06 | Discharge: 2019-11-06 | Disposition: A | Discharge status: Home / Self Care | Payer: Medicaid Other | Source: Ambulatory Visit | Attending: Family Medicine | Admitting: Family Medicine

## 2019-11-06 VITALS — BP 132/75 | HR 89 | Ht 69.0 in | Wt 178.0 lb

## 2019-11-06 DIAGNOSIS — Z0000 Encounter for general adult medical examination without abnormal findings: Secondary | ICD-10-CM | POA: Diagnosis present

## 2019-11-06 DIAGNOSIS — B9689 Other specified bacterial agents as the cause of diseases classified elsewhere: Secondary | ICD-10-CM

## 2019-11-06 DIAGNOSIS — Z113 Encounter for screening for infections with a predominantly sexual mode of transmission: Secondary | ICD-10-CM

## 2019-11-06 DIAGNOSIS — N760 Acute vaginitis: Secondary | ICD-10-CM

## 2019-11-06 MED ORDER — MEDROXYPROGESTERONE ACETATE 150 MG/ML IM SUSP: 150.0000 mg | INTRAMUSCULAR | 0 refills | Status: DC

## 2019-11-06 MED ORDER — MEDROXYPROGESTERONE ACETATE 150 MG/ML IM SUSP: 150.0000 mg | INTRAMUSCULAR | 4 refills | Status: DC

## 2019-11-06 NOTE — Progress Notes (Signed)
GYNECOLOGY ANNUAL PREVENTATIVE CARE ENCOUNTER NOTE  History:     Lauren Mcclain is a 29 y.o. [phone] female here for a routine annual gynecologic exam.  Current complaints: include intermittent leg cramping. Denies other complaints.  Denies abnormal vaginal bleeding, discharge, pelvic pain, problems with intercourse or other gynecologic concerns. She is currently not sexually active and recently split from FOB of her four children. Reports eventually she would like two more children. Taking Depo for birth control. Denies problems eating or drinking. Denies fever, chills, cough, SOB, chest pain, abdominal pain, nausea, vomiting, diarrhea, constipation, dysuria, hematuria, hematochezia, melena, difficulty moving arms/legs, speech difficulty, trouble eating, confusion or any other complaints.   Gynecologic History No LMP recorded (lmp unknown). Patient has had an injection. Contraception: Depo-Provera injections Last Pap: 11/24/2016. Results were: normal  Last mammogram: NA  Obstetric History OB History  Gravida Para Term Preterm AB Living  4 4 4     4   SAB TAB Ectopic Multiple Live Births        0 4    # Outcome Date GA Lbr Len/2nd Weight Sex Delivery Anes PTL Lv  4 Term 04/10/18 [redacted]w[redacted]d 02:58 / 00:07 6 lb 14.2 oz (3.125 kg) F Vag-Vacuum EPI  LIV  3 Term 04/04/17 [redacted]w[redacted]d 07:03 / 00:15 6 lb 0.7 oz (2.741 kg) M Vag-Spont EPI  LIV  2 Term 06/14/14 [redacted]w[redacted]d -15:48 / 00:28 6 lb 2.9 oz (2.805 kg) F Vag-Spont EPI  LIV  1 Term 01/04/13 [redacted]w[redacted]d 452:31 / 01:11 8 lb 1.1 oz (3.66 kg) M Vag-Vacuum EPI  LIV    Past Medical History:  Diagnosis Date  . Chlamydia   . Lyme disease   . Medical history non-contributory   . Trichomonas contact, treated     Past Surgical History:  Procedure Laterality Date  . NO PAST SURGERIES      Current Outpatient Medications on File Prior to Visit  Medication Sig Dispense Refill  . medroxyPROGESTERone (DEPO-PROVERA) 150 MG/ML injection Inject 1 mL (150 mg total) into  the muscle every 3 (three) months. 1 mL 3  . tinidazole (TINDAMAX) 500 MG tablet Take 2 tablets (1,000 mg total) by mouth daily with breakfast. (Patient not taking: Reported on 11/06/2019) 10 tablet 2   No current facility-administered medications on file prior to visit.    No Known Allergies  Social History:  reports that she quit smoking about 3 years ago. Her smoking use included cigars. She quit after 7.00 years of use. She has never used smokeless tobacco. She reports previous drug use. Frequency: 28.00 times per week. Drug: Marijuana. She reports that she does not drink alcohol.  Family History  Problem Relation Age of Onset  . Drug abuse Mother   . Drug abuse Father     The following portions of the patient's history were reviewed and updated as appropriate: allergies, current medications, past family history, past medical history, past social history, past surgical history and problem list.  Review of Systems Pertinent items noted in HPI and remainder of comprehensive ROS otherwise negative.  Physical Exam:  BP 132/75   Pulse 89   Ht 5\' 9"  (1.753 m)   Wt 178 lb (80.7 kg)   LMP  (LMP Unknown)   BMI 26.29 kg/m  CONSTITUTIONAL: Well-developed, well-nourished female in no acute distress.  HENT:  Normocephalic, atraumatic, External right and left ear normal. Oropharynx is clear and moist EYES: Conjunctivae and EOM are normal. No scleral icterus.  NECK: Normal range of motion,  supple, no masses.  Normal thyroid.  SKIN: Skin is warm and dry. No rash noted. Not diaphoretic. No erythema. No pallor. MUSCULOSKELETAL: Normal range of motion. No tenderness.  No cyanosis, clubbing, or edema.  2+ distal pulses. NEUROLOGIC: Alert and oriented to person, place, and time. Normal reflexes, muscle tone coordination.  PSYCHIATRIC: Normal mood and affect. Normal behavior. Normal judgment and thought content. CARDIOVASCULAR: Normal heart rate noted RESPIRATORY: Effort normal, no problems with  respiration noted. BREASTS: Symmetric in size. No masses, tenderness, skin changes, nipple drainage, or lymphadenopathy bilaterally.  ABDOMEN: Soft, no distention noted.  No tenderness, rebound or guarding.  PELVIC: Normal appearing external genitalia and urethral meatus; normal appearing vaginal mucosa and cervix.  No abnormal discharge noted.  Pap smear obtained.  Normal uterine size, no other palpable masses, no uterine or adnexal tenderness.   Assessment and Plan:    Lauren Mcclain was seen today for gynecologic exam.  Diagnoses and all orders for this visit:  Encounter for annual physical examination excluding gynecological examination in a patient older than 17 years -     Cytology - PAP( [place]) -     Cervicovaginal ancillary only( [place]) -     medroxyPROGESTERone (DEPO-PROVERA) 150 MG/ML injection; Inject 1 mL (150 mg total) into the muscle every 3 (three) months. -     Normal exam. Hx of chlamydia. Not currently sexually active but desires STD testing as below. Advised increased water intake and consider prenatal or multi-vitamin. RTC in 1 year.   Screen for STD (sexually transmitted disease) -     HIV antibody (with reflex) -     RPR -     Hepatitis C Antibody -     Hepatitis B surface antigen  Will follow up results of pap smear and manage accordingly. Routine preventative health maintenance measures emphasized. Please refer to After Visit Summary for other counseling recommendations.     Barrington Ellison, MD [facility] Family Medicine Fellow, [facility] for [employer], [place]

## 2019-11-06 NOTE — Progress Notes (Addendum)
GYN presents for AEX/PAP/STD testing.  C/o cramps in her legs, arms and abdomen.  Tindamax hurts her stomach.  GAD-7=0

## 2019-11-07 LAB — CERVICOVAGINAL ANCILLARY ONLY
Bacterial Vaginitis (gardnerella): POSITIVE — AB
Candida Glabrata: NEGATIVE
Candida Vaginitis: NEGATIVE
Chlamydia: NEGATIVE
Comment: NEGATIVE
Comment: NEGATIVE
Comment: NEGATIVE
Comment: NEGATIVE
Comment: NEGATIVE
Comment: NORMAL
Neisseria Gonorrhea: NEGATIVE
Trichomonas: NEGATIVE

## 2019-11-07 LAB — HEPATITIS C ANTIBODY: Hep C Virus Ab: <0.1 s/co ratio (ref 0.0–0.9)

## 2019-11-07 LAB — RPR: RPR Ser Ql: NONREACTIVE

## 2019-11-07 LAB — CYTOLOGY - PAP: Diagnosis: NEGATIVE

## 2019-11-07 LAB — HIV ANTIBODY (ROUTINE TESTING W REFLEX): HIV Screen 4th Generation wRfx: NONREACTIVE

## 2019-11-07 LAB — HEPATITIS B SURFACE ANTIGEN: Hepatitis B Surface Ag: NEGATIVE

## 2019-11-07 MED ORDER — METRONIDAZOLE 0.75 % VA GEL: 1.0000 | Freq: Every day | VAGINAL | 1 refills | Status: DC

## 2019-11-07 NOTE — Addendum Note (Signed)
Addended by: Jerilynn Birkenhead on: 11/07/2019 03:39 PM   Modules accepted: Orders

## 2019-12-11 ENCOUNTER — Ambulatory Visit (INDEPENDENT_AMBULATORY_CARE_PROVIDER_SITE_OTHER): Payer: Medicaid Other

## 2019-12-11 ENCOUNTER — Other Ambulatory Visit: Payer: Self-pay

## 2019-12-11 VITALS — BP 125/80 | HR 57 | Wt 172.8 lb

## 2019-12-11 DIAGNOSIS — Z3042 Encounter for surveillance of injectable contraceptive: Secondary | ICD-10-CM | POA: Diagnosis not present

## 2019-12-11 MED ORDER — MEDROXYPROGESTERONE ACETATE 150 MG/ML IM SUSP
150.0000 mg | Freq: Once | INTRAMUSCULAR | Status: AC
  Administered 2019-12-11: 150 mg via INTRAMUSCULAR

## 2019-12-11 NOTE — Progress Notes (Signed)
29 year old presents for DEPO, Office stock given in LUOQ, tolerated well.  Next DEPO May 12-26/2021  Administrations This Visit    medroxyPROGESTERone (DEPO-PROVERA) injection 150 mg    Admin Date 12/11/2019 Action Given Dose 150 mg Route Intramuscular Administered By Maretta Bees, RMA

## 2020-03-03 ENCOUNTER — Ambulatory Visit (INDEPENDENT_AMBULATORY_CARE_PROVIDER_SITE_OTHER): Payer: Medicaid Other

## 2020-03-03 ENCOUNTER — Other Ambulatory Visit: Payer: Self-pay

## 2020-03-03 ENCOUNTER — Other Ambulatory Visit (HOSPITAL_COMMUNITY)
Admission: RE | Admit: 2020-03-03 | Discharge: 2020-03-03 | Disposition: A | Discharge status: Home / Self Care | Payer: Medicaid Other | Source: Ambulatory Visit | Attending: Obstetrics and Gynecology | Admitting: Obstetrics and Gynecology

## 2020-03-03 VITALS — BP 121/73 | HR 65 | Ht 69.0 in | Wt 177.0 lb

## 2020-03-03 DIAGNOSIS — N898 Other specified noninflammatory disorders of vagina: Secondary | ICD-10-CM | POA: Diagnosis present

## 2020-03-03 DIAGNOSIS — Z3042 Encounter for surveillance of injectable contraceptive: Secondary | ICD-10-CM

## 2020-03-03 MED ORDER — MEDROXYPROGESTERONE ACETATE 150 MG/ML IM SUSP
150.0000 mg | Freq: Once | INTRAMUSCULAR | Status: AC
  Administered 2020-03-03: 150 mg via INTRAMUSCULAR

## 2020-03-03 NOTE — Progress Notes (Addendum)
SUBJECTIVE:  29 y.o. female complains of clear vaginal discharge for 2 week(s). Denies abnormal vaginal bleeding or significant pelvic pain, dysuria or fever. No UTI symptoms. Denies history of known exposure to STD.  No LMP recorded. Patient has had an injection.   OBJECTIVE:  She appears well, afebrile. Urine dipstick: not done.  ASSESSMENT:  Vaginal Discharge   PLAN:  GC, chlamydia, trichomonas, BVAG, CVAG probe sent to lab.  Treatment: To be determined once lab results are received, patient prefers to get the gel instead of tablets for treatment if she has BV, tablets makes her sick.   ROV prn if symptoms persist or worsen.   Patient is on time for DEPO injection, given RUOQ, tolerated well.  Next DEPO August 3-17, 2021.  Administrations This Visit    medroxyPROGESTERone (DEPO-PROVERA) injection 150 mg    Admin Date 03/03/2020 Action Given Dose 150 mg Route Intramuscular Administered By Maretta Bees, RMA         Chart reviewed for nurse visit. Agree with plan of care.   Currie Paris, NP 03/03/2020 11:42 AM

## 2020-03-04 LAB — CERVICOVAGINAL ANCILLARY ONLY
Bacterial Vaginitis (gardnerella): POSITIVE — AB
Candida Glabrata: NEGATIVE
Candida Vaginitis: NEGATIVE
Chlamydia: NEGATIVE
Comment: NEGATIVE
Comment: NEGATIVE
Comment: NEGATIVE
Comment: NEGATIVE
Comment: NEGATIVE
Comment: NORMAL
Neisseria Gonorrhea: NEGATIVE
Trichomonas: NEGATIVE

## 2020-03-05 ENCOUNTER — Telehealth: Payer: Self-pay

## 2020-03-05 ENCOUNTER — Other Ambulatory Visit: Payer: Self-pay | Admitting: Obstetrics

## 2020-03-05 DIAGNOSIS — B9689 Other specified bacterial agents as the cause of diseases classified elsewhere: Secondary | ICD-10-CM

## 2020-03-05 MED ORDER — METRONIDAZOLE 0.75 % VA GEL: 1.0000 | Freq: Every day | VAGINAL | 1 refills | Status: DC

## 2020-03-05 NOTE — Telephone Encounter (Signed)
S/w pt and advised of results and rx sent. 

## 2020-09-22 IMAGING — US US ABDOMEN LIMITED
1 series · 14 of 25 positions shown · non-contrast
Comparison: None.

CLINICAL DATA: Right upper quadrant pain

EXAM:
ULTRASOUND ABDOMEN LIMITED RIGHT UPPER QUADRANT

[Series 1: us abdomen limited · 14 of 39 slices shown]
[im 1/39]
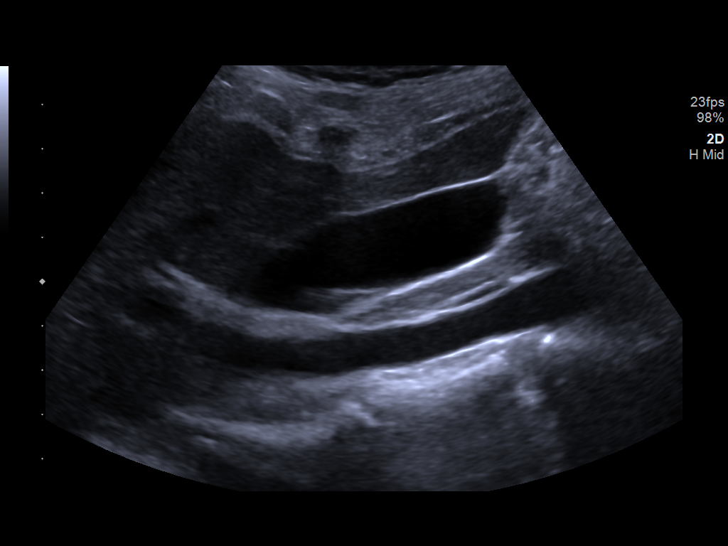
[im 4/39]
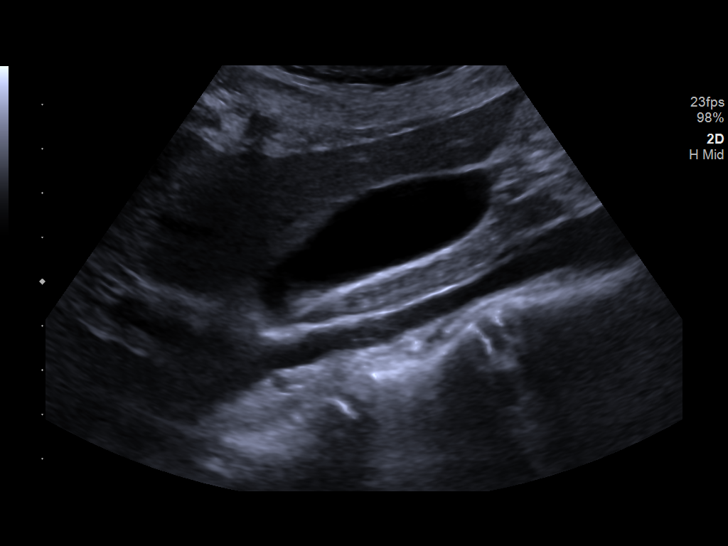
[im 7/39]
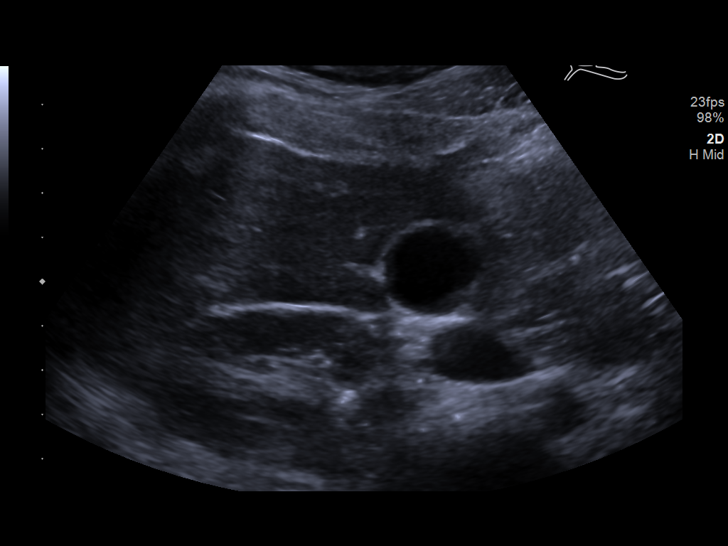
[im 10/39]
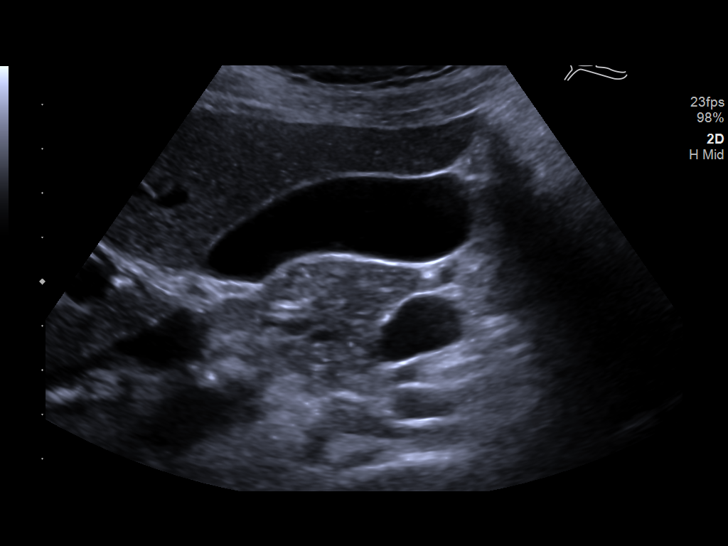
[im 13/39]
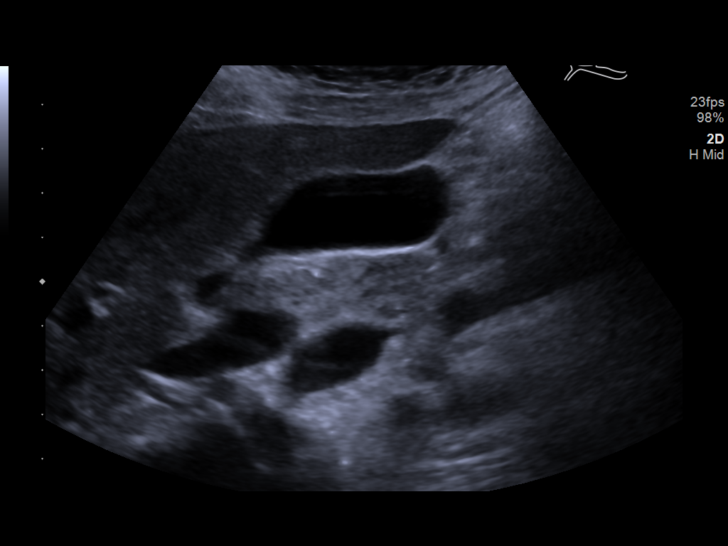
[im 15/39]
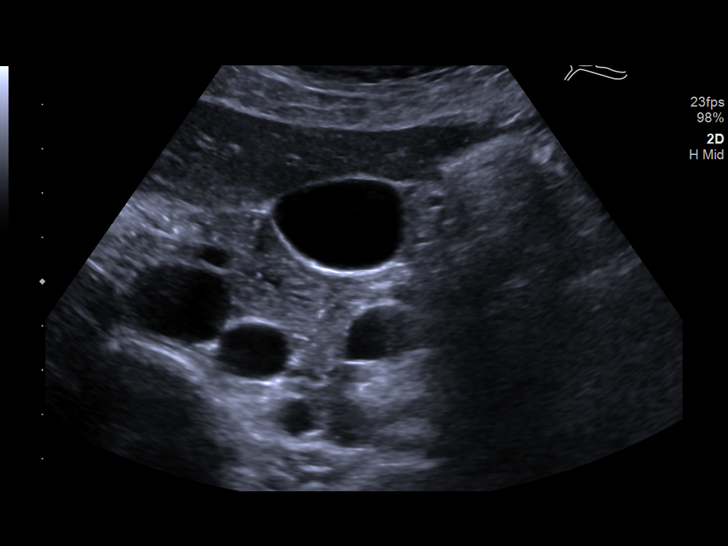
[im 18/39]
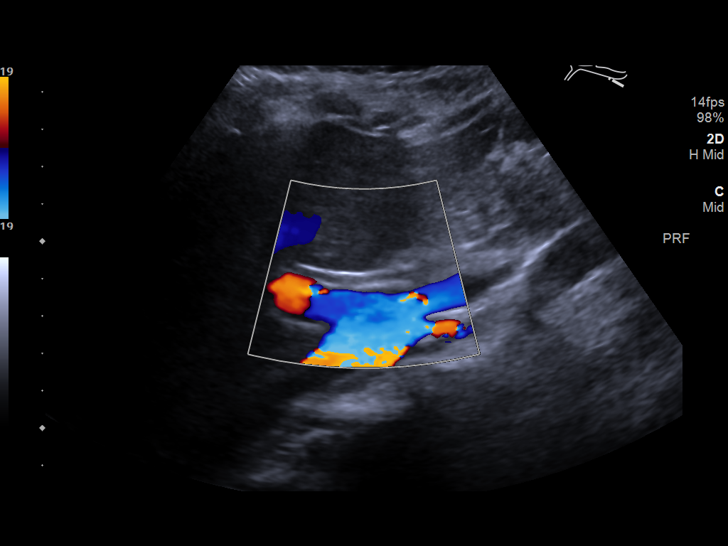
[im 21/39]
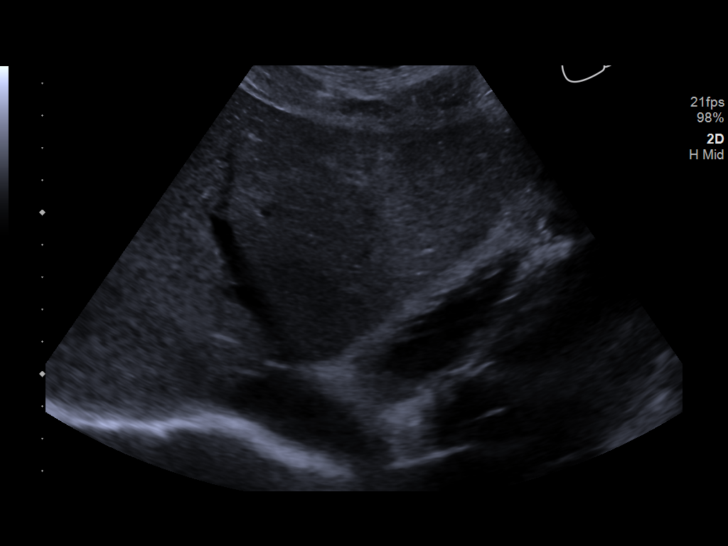
[im 24/39]
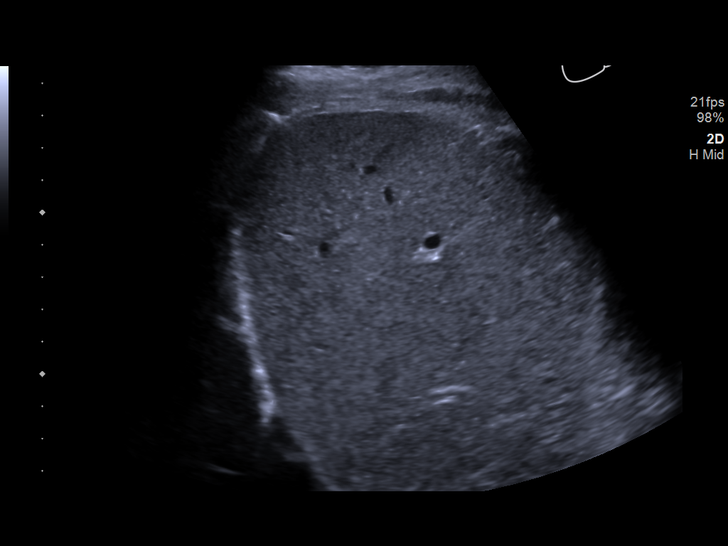
[im 26/39]
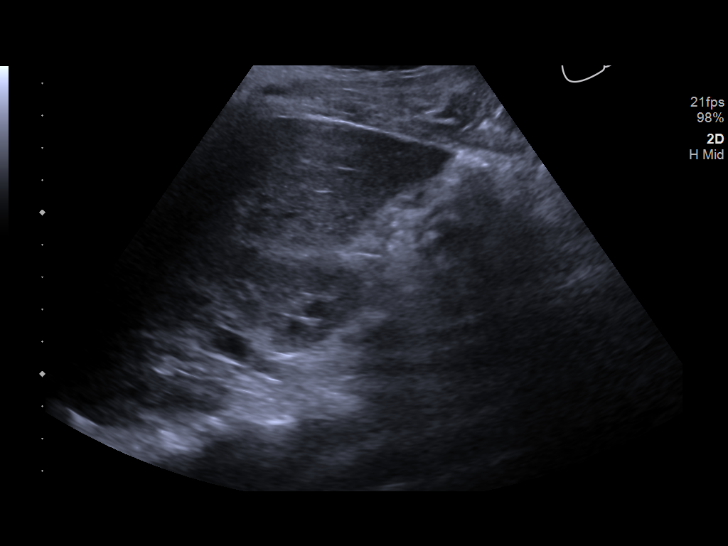
[im 29/39]
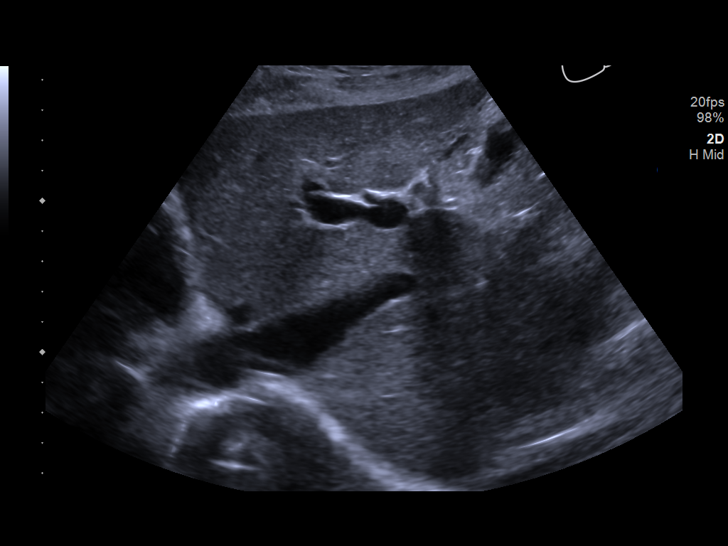
[im 32/39]
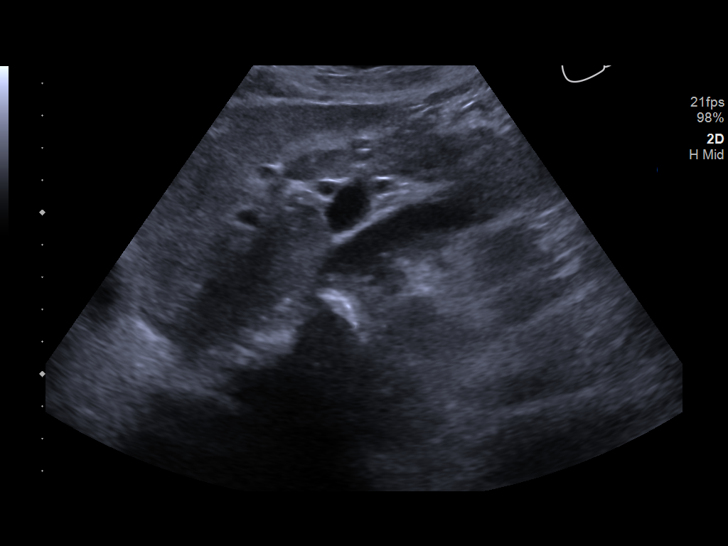
[im 35/39]
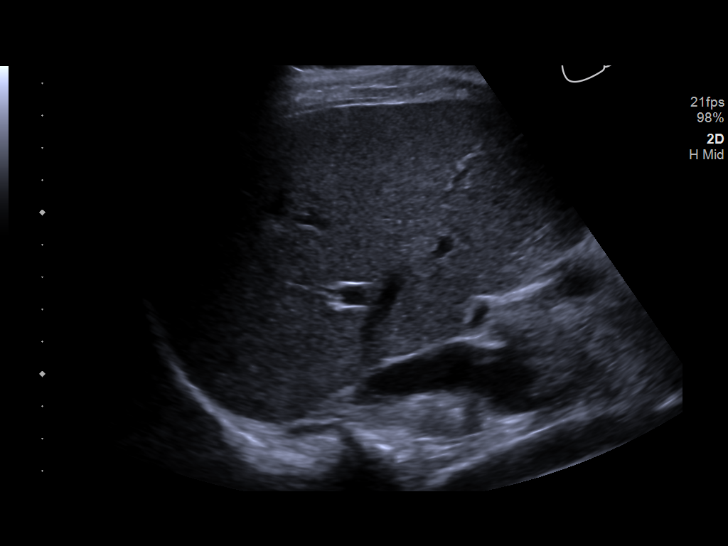
[im 39/39]
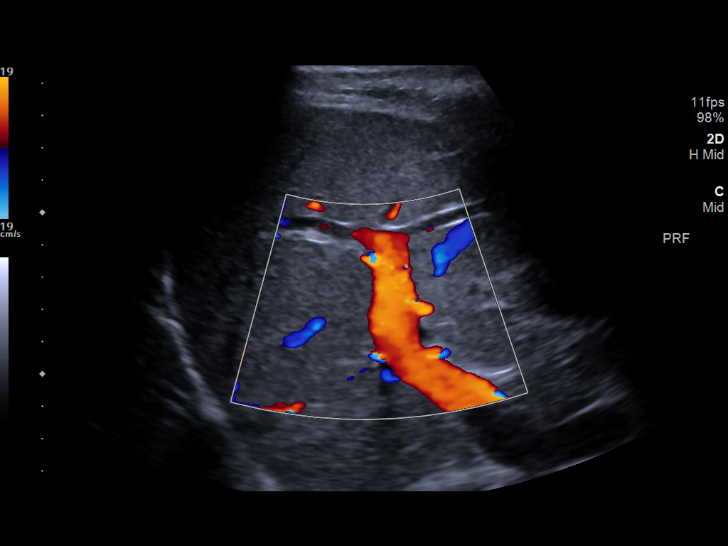

[14 of 25 positions shown; findings below may reference images not displayed]

FINDINGS: Gallbladder:

No gallstones or wall thickening visualized. No sonographic Murphy
sign noted by sonographer.

Common bile duct:

Diameter: 3 mm

Liver:

No focal lesion identified. Within normal limits in parenchymal
echogenicity. Portal vein is patent on color Doppler imaging with
normal direction of blood flow towards the liver.
IMPRESSION: Normal right upper quadrant ultrasound.

## 2020-09-22 IMAGING — DX DG ABDOMEN 1V
1 series · 1 of 1 positions shown · non-contrast
Comparison: None.

CLINICAL DATA: Right-sided stomach pain for 1 week. Rule out
constipation

EXAM:
ABDOMEN - 1 VIEW

[abdomen kub]
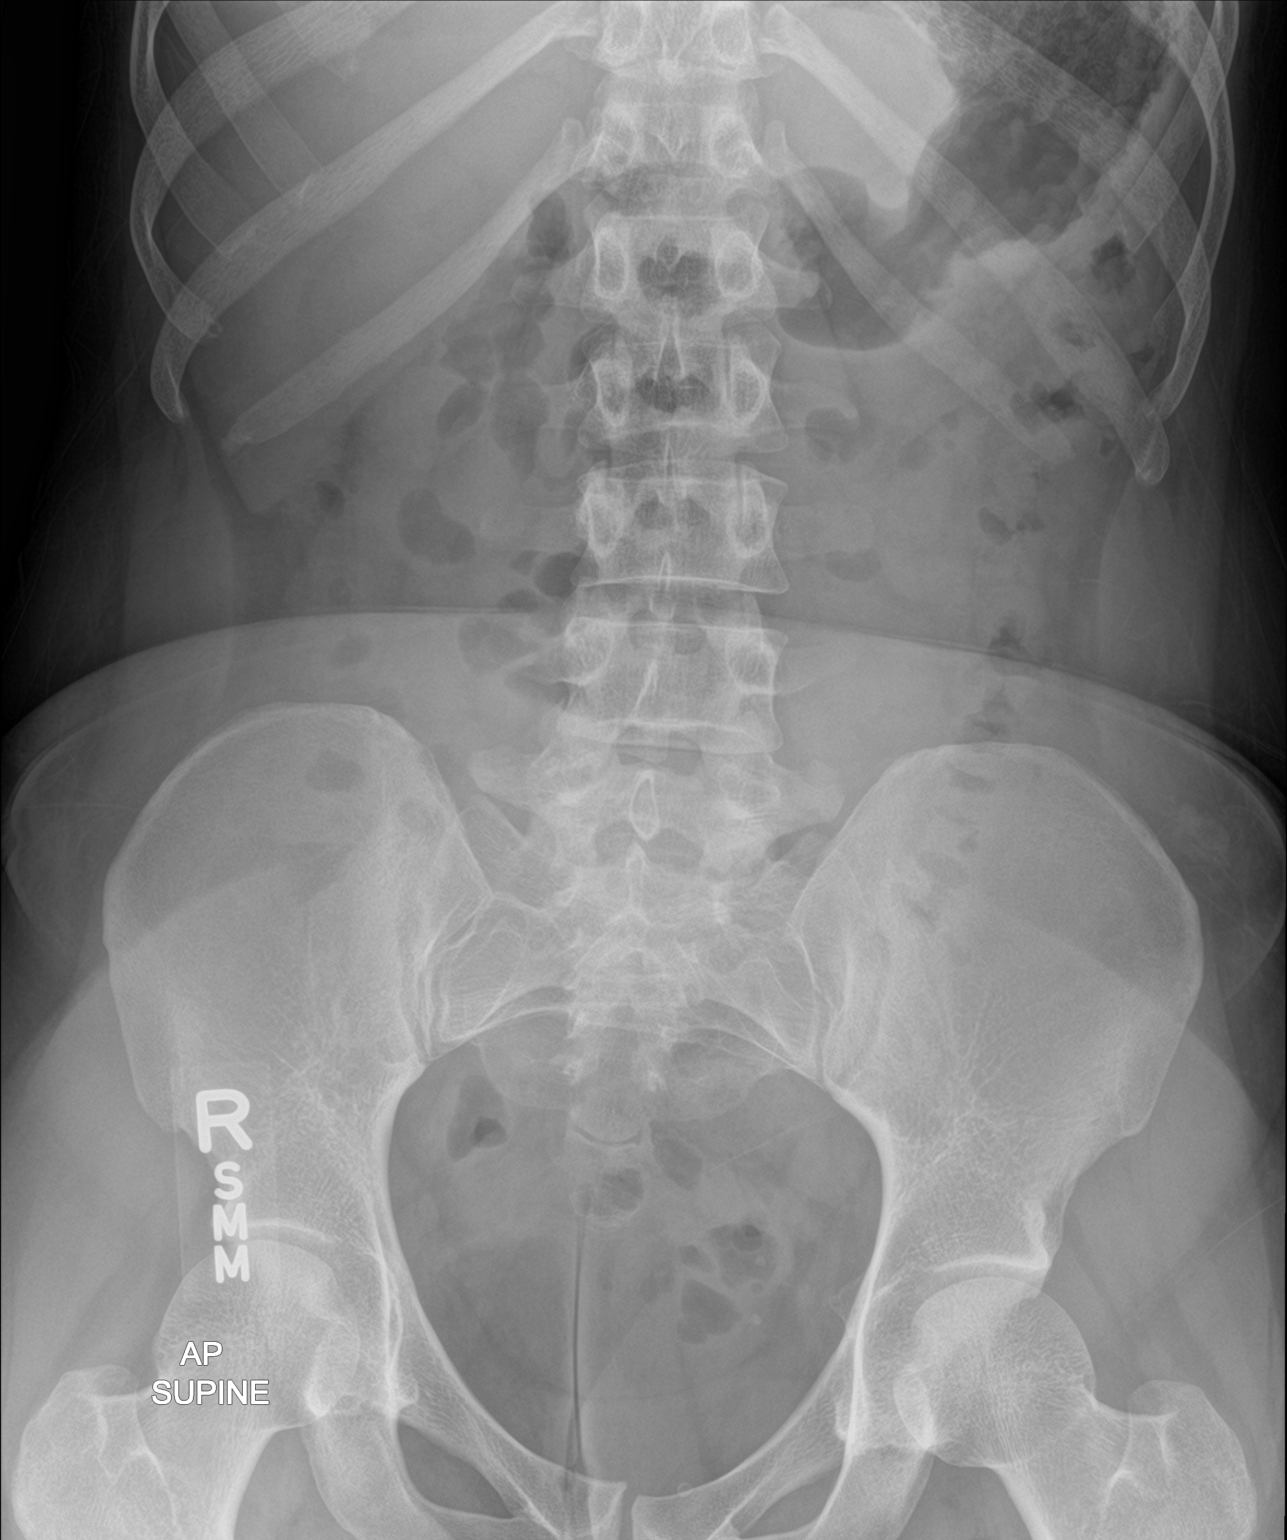

[1 of 1 positions shown; findings below may reference images not displayed]

FINDINGS: The bowel gas pattern is normal. No abnormal stool retention. Left
pelvic calcifications are likely phleboliths. No concerning mass
effect or gas collection.
IMPRESSION: Negative exam.  No abnormal stool retention.

## 2020-10-12 ENCOUNTER — Other Ambulatory Visit: Payer: Self-pay

## 2020-10-12 ENCOUNTER — Encounter (HOSPITAL_COMMUNITY): Payer: Self-pay

## 2020-10-12 ENCOUNTER — Emergency Department (HOSPITAL_COMMUNITY)
Admission: EM | Admit: 2020-10-12 | Discharge: 2020-10-12 | Disposition: A | Discharge status: Home / Self Care | Payer: Medicaid Other | Attending: Emergency Medicine | Admitting: Emergency Medicine

## 2020-10-12 DIAGNOSIS — U071 COVID-19: Secondary | ICD-10-CM | POA: Diagnosis not present

## 2020-10-12 DIAGNOSIS — R509 Fever, unspecified: Secondary | ICD-10-CM | POA: Diagnosis present

## 2020-10-12 DIAGNOSIS — Z5321 Procedure and treatment not carried out due to patient leaving prior to being seen by health care provider: Secondary | ICD-10-CM | POA: Insufficient documentation

## 2020-10-12 LAB — CBC
HCT: 38.5 % (ref 36.0–46.0)
Hemoglobin: 12.7 g/dL (ref 12.0–15.0)
MCH: 30.3 pg (ref 26.0–34.0)
MCHC: 33 g/dL (ref 30.0–36.0)
MCV: 91.9 fL (ref 80.0–100.0)
Platelets: 147 10*3/uL — ABNORMAL LOW (ref 150–400)
RBC: 4.19 MIL/uL (ref 3.87–5.11)
RDW: 12.6 % (ref 11.5–15.5)
WBC: 6 10*3/uL (ref 4.0–10.5)
nRBC: 0 % (ref 0.0–0.2)

## 2020-10-12 LAB — RESP PANEL BY RT-PCR (FLU A&B, COVID) ARPGX2
Influenza A by PCR: NEGATIVE
Influenza B by PCR: NEGATIVE
SARS Coronavirus 2 by RT PCR: POSITIVE — AB

## 2020-10-12 LAB — I-STAT BETA HCG BLOOD, ED (MC, WL, AP ONLY): I-stat hCG, quantitative: <5 m[IU]/mL (ref <5)

## 2020-10-12 NOTE — ED Notes (Signed)
Pt left AMA °

## 2020-10-12 NOTE — ED Triage Notes (Addendum)
Pt c/o feeling hot like a fever, leg pain lower abdominal pain. Also c/o heavy menstrual period.

## 2020-10-13 ENCOUNTER — Telehealth: Payer: Self-pay | Admitting: Unknown Physician Specialty

## 2020-10-13 ENCOUNTER — Encounter: Payer: Self-pay | Admitting: Unknown Physician Specialty

## 2020-10-13 NOTE — Telephone Encounter (Signed)
Called to Discuss with patient about Covid symptoms and the use of the monoclonal antibody infusion for those with mild to moderate Covid symptoms and at a high risk of hospitalization.     Pt appears to qualify for this infusion due to co-morbid conditions and/or a member of an at-risk group in accordance with the FDA Emergency Use Authorization.    Unable to reach pt  LMOM and mychart 

## 2021-02-15 ENCOUNTER — Ambulatory Visit: Payer: Medicaid Other | Admitting: Obstetrics

## 2021-04-13 ENCOUNTER — Ambulatory Visit (INDEPENDENT_AMBULATORY_CARE_PROVIDER_SITE_OTHER): Payer: Medicaid Other | Admitting: Obstetrics

## 2021-04-13 ENCOUNTER — Other Ambulatory Visit (HOSPITAL_COMMUNITY)
Admission: RE | Admit: 2021-04-13 | Discharge: 2021-04-13 | Disposition: A | Discharge status: Home / Self Care | Payer: Medicaid Other | Source: Ambulatory Visit | Attending: Obstetrics | Admitting: Obstetrics

## 2021-04-13 ENCOUNTER — Encounter: Payer: Self-pay | Admitting: Obstetrics

## 2021-04-13 ENCOUNTER — Other Ambulatory Visit: Payer: Self-pay

## 2021-04-13 VITALS — BP 108/68 | HR 58 | Ht 69.0 in | Wt 164.4 lb

## 2021-04-13 DIAGNOSIS — Z0000 Encounter for general adult medical examination without abnormal findings: Secondary | ICD-10-CM

## 2021-04-13 DIAGNOSIS — M549 Dorsalgia, unspecified: Secondary | ICD-10-CM

## 2021-04-13 DIAGNOSIS — Z01419 Encounter for gynecological examination (general) (routine) without abnormal findings: Secondary | ICD-10-CM

## 2021-04-13 DIAGNOSIS — N898 Other specified noninflammatory disorders of vagina: Secondary | ICD-10-CM | POA: Insufficient documentation

## 2021-04-13 DIAGNOSIS — Z113 Encounter for screening for infections with a predominantly sexual mode of transmission: Secondary | ICD-10-CM | POA: Diagnosis not present

## 2021-04-13 DIAGNOSIS — Z3009 Encounter for other general counseling and advice on contraception: Secondary | ICD-10-CM | POA: Diagnosis not present

## 2021-04-13 DIAGNOSIS — Z30013 Encounter for initial prescription of injectable contraceptive: Secondary | ICD-10-CM | POA: Diagnosis not present

## 2021-04-13 LAB — POCT URINE PREGNANCY: Preg Test, Ur: NEGATIVE

## 2021-04-13 MED ORDER — MEDROXYPROGESTERONE ACETATE 150 MG/ML IM SUSP
150.0000 mg | Freq: Once | INTRAMUSCULAR | Status: AC
  Administered 2021-04-13: 150 mg via INTRAMUSCULAR

## 2021-04-13 MED ORDER — IBUPROFEN 800 MG PO TABS: 800.0000 mg | ORAL_TABLET | Freq: Three times a day (TID) | ORAL | 5 refills | Status: DC | PRN

## 2021-04-13 MED ORDER — MEDROXYPROGESTERONE ACETATE 150 MG/ML IM SUSP: 150.0000 mg | INTRAMUSCULAR | 4 refills | Status: DC

## 2021-04-13 MED ORDER — VITAFOL ULTRA 29-0.6-0.4-200 MG PO CAPS: 1.0000 | ORAL_CAPSULE | Freq: Every day | ORAL | 4 refills | Status: DC

## 2021-04-13 NOTE — Progress Notes (Signed)
Patient presents for AEX. Patient has no concerns today. She desires to have Std testing completed. Patient states that she would like to start back on Depo shot. Last time she has had unprotected intercourse was last month 5/24.  Last pap: 11/06/19 Normal

## 2021-04-13 NOTE — Progress Notes (Signed)
Subjective:        Lauren Mcclain is a 30 y.o. female here for a routine exam.  Current complaints: Backache from epidural.  Vaginal discharge..    Personal health questionnaire:  Is patient Ashkenazi Jewish, have a family history of breast and/or ovarian cancer: no Is there a family history of uterine cancer diagnosed at age < 85, gastrointestinal cancer, urinary tract cancer, family member who is a Personnel officer syndrome-associated carrier: no Is the patient overweight and hypertensive, family history of diabetes, personal history of gestational diabetes, preeclampsia or PCOS: no Is patient over 82, have PCOS,  family history of premature CHD under age 95, diabetes, smoke, have hypertension or peripheral artery disease:  no At any time, has a partner hit, kicked or otherwise hurt or frightened you?: no Over the past 2 weeks, have you felt down, depressed or hopeless?: no Over the past 2 weeks, have you felt little interest or pleasure in doing things?:no   Gynecologic History Patient's last menstrual period was 03/22/2021. Contraception: none Last Pap: 2021. Results were: normal Last mammogram: n/a. Results were: n/a  Obstetric History OB History  Gravida Para Term Preterm AB Living  4 4 4     4   SAB IAB Ectopic Multiple Live Births        0 4    # Outcome Date GA Lbr Len/2nd Weight Sex Delivery Anes PTL Lv  4 Term 04/10/18 [redacted]w[redacted]d 02:58 / 00:07 6 lb 14.2 oz (3.125 kg) F Vag-Vacuum EPI  LIV  3 Term 04/04/17 [redacted]w[redacted]d 07:03 / 00:15 6 lb 0.7 oz (2.741 kg) M Vag-Spont EPI  LIV  2 Term 06/14/14 [redacted]w[redacted]d -15:48 / 00:28 6 lb 2.9 oz (2.805 kg) F Vag-Spont EPI  LIV  1 Term 01/04/13 [redacted]w[redacted]d 452:31 / 01:11 8 lb 1.1 oz (3.66 kg) M Vag-Vacuum EPI  LIV    Past Medical History:  Diagnosis Date   Chlamydia    Lyme disease    Medical history non-contributory    Trichomonas contact, treated     Past Surgical History:  Procedure Laterality Date   NO PAST SURGERIES       Current Outpatient  Medications:    medroxyPROGESTERone (DEPO-PROVERA) 150 MG/ML injection, Inject 1 mL (150 mg total) into the muscle every 3 (three) months., Disp: 1 mL, Rfl: 4  Current Facility-Administered Medications:    medroxyPROGESTERone (DEPO-PROVERA) injection 150 mg, 150 mg, Intramuscular, Once, [redacted]w[redacted]d, MD No Known Allergies  Social History   Tobacco Use   Smoking status: Former    Pack years: 0.00    Types: Cigars    Quit date: 2018    Years since quitting: 4.4   Smokeless tobacco: Never   Tobacco comments:    black and mild; 1/d  Substance Use Topics   Alcohol use: No    Comment: Not currently    Family History  Problem Relation Age of Onset   Drug abuse Mother    Drug abuse Father       Review of Systems  Constitutional: negative for fatigue and weight loss Respiratory: negative for cough and wheezing Cardiovascular: negative for chest pain, fatigue and palpitations Gastrointestinal: negative for abdominal pain and change in bowel habits Musculoskeletal: positive for myalgias - backache Neurological: negative for gait problems and tremors Behavioral/Psych: negative for abusive relationship, depression Endocrine: negative for temperature intolerance    Genitourinary: positive for vaginal discharge.  negative for abnormal menstrual periods, genital lesions, hot flashes, sexual problems  Integument/breast: negative for  breast lump, breast tenderness, nipple discharge and skin lesion(s)    Objective:       BP 108/68   Pulse (!) 58   Ht 5\' 9"  (1.753 m)   Wt 164 lb 6.4 oz (74.6 kg)   LMP 03/22/2021   BMI 24.28 kg/m  General:   Alert and no distress  Skin:   no rash or abnormalities  Lungs:   clear to auscultation bilaterally  Heart:   regular rate and rhythm, S1, S2 normal, no murmur, click, rub or gallop  Breasts:   normal without suspicious masses, skin or nipple changes or axillary nodes  Abdomen:  normal findings: no organomegaly, soft, non-tender and no  hernia  Pelvis:  External genitalia: normal general appearance Urinary system: urethral meatus normal and bladder without fullness, nontender Vaginal: normal without tenderness, induration or masses Cervix: normal appearance Adnexa: normal bimanual exam Uterus: anteverted and non-tender, normal size   Lab Review Urine pregnancy test Labs reviewed yes Radiologic studies reviewed no  I have spent a total of 20 minutes of face-to-face time, excluding clinical staff time, reviewing notes and preparing to see patient, ordering tests and/or medications, and counseling the patient.   Assessment:    1. Encounter for gynecological examination with Papanicolaou smear of cervix Rx: - Cytology - PAP( [place])  2. Vaginal discharge Rx: - Cervicovaginal ancillary only( [place])  3. Screening for STD (sexually transmitted disease) Rx: - Hepatitis B surface antigen - Hepatitis C antibody - HIV Antibody (routine testing w rflx) - RPR  4. Encounter for counseling regarding contraception - options discussed.  All questions answered - wants Depo  5. Encounter for initial prescription of injectable contraceptive Rx: - medroxyPROGESTERone (DEPO-PROVERA) 150 MG/ML injection; Inject 1 mL (150 mg total) into the muscle every 3 (three) months.  Dispense: 1 mL; Refill: 4 - medroxyPROGESTERone (DEPO-PROVERA) injection 150 mg - POCT urine pregnancy  6. Routine adult health maintenance Rx: - Ambulatory referral to Internal Medicine - Prenat-Fe Poly-Methfol-FA-DHA (VITAFOL ULTRA) 29-0.6-0.4-200 MG CAPS; Take 1 capsule by mouth daily before breakfast.  Dispense: 90 capsule; Refill: 4  7. Backache symptom Rx: - ibuprofen (ADVIL) 800 MG tablet; Take 1 tablet (800 mg total) by mouth every 8 (eight) hours as needed.  Dispense: 30 tablet; Refill: 5     Plan:    Education reviewed: calcium supplements, depression evaluation, low fat, low cholesterol diet, safe sex/STD prevention, self breast  exams, and weight bearing exercise. Contraception: Depo-Provera injections. Follow up in: 1 year.   Meds ordered this encounter  Medications   medroxyPROGESTERone (DEPO-PROVERA) 150 MG/ML injection    Sig: Inject 1 mL (150 mg total) into the muscle every 3 (three) months.    Dispense:  1 mL    Refill:  4   medroxyPROGESTERone (DEPO-PROVERA) injection 150 mg   Orders Placed This Encounter  Procedures   Hepatitis B surface antigen   Hepatitis C antibody   HIV Antibody (routine testing w rflx)   RPR   POCT urine pregnancy     01-25-1985, MD 04/13/2021 11:46 AM

## 2021-04-14 ENCOUNTER — Other Ambulatory Visit: Payer: Self-pay | Admitting: Obstetrics

## 2021-04-14 DIAGNOSIS — N760 Acute vaginitis: Secondary | ICD-10-CM

## 2021-04-14 LAB — CERVICOVAGINAL ANCILLARY ONLY
Bacterial Vaginitis (gardnerella): POSITIVE — AB
Candida Glabrata: NEGATIVE
Candida Vaginitis: NEGATIVE
Chlamydia: NEGATIVE
Comment: NEGATIVE
Comment: NEGATIVE
Comment: NEGATIVE
Comment: NEGATIVE
Comment: NEGATIVE
Comment: NORMAL
Neisseria Gonorrhea: NEGATIVE
Trichomonas: NEGATIVE

## 2021-04-14 LAB — HIV ANTIBODY (ROUTINE TESTING W REFLEX): HIV Screen 4th Generation wRfx: NONREACTIVE

## 2021-04-14 LAB — HEPATITIS B SURFACE ANTIGEN: Hepatitis B Surface Ag: NEGATIVE

## 2021-04-14 LAB — HEPATITIS C ANTIBODY: Hep C Virus Ab: <0.1 s/co ratio (ref 0.0–0.9)

## 2021-04-14 LAB — RPR: RPR Ser Ql: NONREACTIVE

## 2021-04-14 MED ORDER — METRONIDAZOLE 500 MG PO TABS: 500.0000 mg | ORAL_TABLET | Freq: Two times a day (BID) | ORAL | 2 refills | Status: DC

## 2021-04-16 LAB — CYTOLOGY - PAP: Diagnosis: NEGATIVE

## 2021-07-05 ENCOUNTER — Ambulatory Visit: Payer: Medicaid Other

## 2021-07-13 ENCOUNTER — Ambulatory Visit: Payer: Medicaid Other

## 2021-07-13 ENCOUNTER — Ambulatory Visit (INDEPENDENT_AMBULATORY_CARE_PROVIDER_SITE_OTHER): Payer: Medicaid Other

## 2021-07-13 ENCOUNTER — Other Ambulatory Visit: Payer: Self-pay

## 2021-07-13 DIAGNOSIS — Z3042 Encounter for surveillance of injectable contraceptive: Secondary | ICD-10-CM | POA: Diagnosis not present

## 2021-07-13 MED ORDER — MEDROXYPROGESTERONE ACETATE 150 MG/ML IM SUSP
150.0000 mg | Freq: Once | INTRAMUSCULAR | Status: AC
  Administered 2021-07-13: 150 mg via INTRAMUSCULAR

## 2021-07-13 NOTE — Progress Notes (Signed)
Patient presents for depo injection. Patient is within her window. Inj given in LUOQ. Patient tolerated well. Next depo due 12/13-12/27.

## 2021-08-20 ENCOUNTER — Encounter (HOSPITAL_COMMUNITY): Payer: Self-pay

## 2021-08-20 ENCOUNTER — Ambulatory Visit (HOSPITAL_COMMUNITY)
Admission: EM | Admit: 2021-08-20 | Discharge: 2021-08-20 | Disposition: A | Discharge status: Home / Self Care | Payer: Medicaid Other

## 2021-08-20 ENCOUNTER — Other Ambulatory Visit: Payer: Self-pay

## 2021-08-20 DIAGNOSIS — R0989 Other specified symptoms and signs involving the circulatory and respiratory systems: Secondary | ICD-10-CM | POA: Diagnosis not present

## 2021-08-20 DIAGNOSIS — Z7689 Persons encountering health services in other specified circumstances: Secondary | ICD-10-CM

## 2021-08-20 NOTE — Discharge Instructions (Addendum)
-  With a virus, you're typically contagious for 5-7 days, or as long as you're having fevers.  ° °

## 2021-08-20 NOTE — ED Triage Notes (Signed)
Pt reports chills, body aches, nasal congestion x 3 days.

## 2021-08-20 NOTE — ED Provider Notes (Signed)
[place]    CSN: [id number] Arrival date & time: 08/20/21  1646      History   Chief Complaint Chief Complaint  Patient presents with   Chills   Generalized Body Aches    HPI Lauren Mcclain is a 30 y.o. female presenting with viral syndrome for about 4 days.  Medical history THC abuse.  Describes 4 days of generalized body aches, subjective chills, occasional dry cough, clear nasal congestion.  Robitussin over-the-counter is providing some relief. Tolerating fluids and food.   HPI  Past Medical History:  Diagnosis Date   Chlamydia    Lyme disease    Medical history non-contributory    Trichomonas contact, treated     Patient Active Problem List   Diagnosis Date Noted   Low vitamin D level 11/30/2016   Mild tetrahydrocannabinol (THC) abuse 11/30/2016    Past Surgical History:  Procedure Laterality Date   NO PAST SURGERIES      OB History     Gravida  4   Para  4   Term  4   Preterm      AB      Living  4      SAB      IAB      Ectopic      Multiple  0   Live Births  4            Home Medications    Prior to Admission medications   Medication Sig Start Date End Date Taking? Authorizing Provider  ibuprofen (ADVIL) 800 MG tablet Take 1 tablet (800 mg total) by mouth every 8 (eight) hours as needed. 04/13/21   Shelly Bombard, MD  medroxyPROGESTERone (DEPO-PROVERA) 150 MG/ML injection Inject 1 mL (150 mg total) into the muscle every 3 (three) months. 04/13/21   Shelly Bombard, MD  metroNIDAZOLE (FLAGYL) 500 MG tablet Take 1 tablet (500 mg total) by mouth 2 (two) times daily. 04/14/21   Shelly Bombard, MD  Prenat-Fe Poly-Methfol-FA-DHA (VITAFOL ULTRA) 29-0.6-0.4-200 MG CAPS Take 1 capsule by mouth daily before breakfast. 04/13/21   Shelly Bombard, MD    Family History Family History  Problem Relation Age of Onset   Drug abuse Mother    Drug abuse Father     Social History Social History   Tobacco Use    Smoking status: Former    Types: Cigars    Quit date: 2018    Years since quitting: 4.8   Smokeless tobacco: Never   Tobacco comments:    black and mild; 1/d  Substance Use Topics   Alcohol use: No    Comment: Not currently   Drug use: Yes    Frequency: 28.0 times per week    Types: Marijuana    Comment: daily     Allergies   Patient has no known allergies.   Review of Systems Review of Systems  Constitutional:  Negative for appetite change, chills and fever.  HENT:  Positive for congestion. Negative for ear pain, rhinorrhea, sinus pressure, sinus pain and sore throat.   Eyes:  Negative for redness and visual disturbance.  Respiratory:  Positive for cough. Negative for chest tightness, shortness of breath and wheezing.   Cardiovascular:  Negative for chest pain and palpitations.  Gastrointestinal:  Negative for abdominal pain, constipation, diarrhea, nausea and vomiting.  Genitourinary:  Negative for dysuria, frequency and urgency.  Musculoskeletal:  Negative for myalgias.  Neurological:  Negative for dizziness, weakness and  headaches.  Psychiatric/Behavioral:  Negative for confusion.   All other systems reviewed and are negative.   Physical Exam Triage Vital Signs ED Triage Vitals [08/20/21 1822]  Enc Vitals Group     BP 118/78     Pulse Rate 74     Resp 18     Temp 98.5 F (36.9 C)     Temp Source Oral     SpO2 99 %     Weight      Height      Head Circumference      Peak Flow      Pain Score 0     Pain Loc      Pain Edu?      Excl. in [place]?    No data found.  Updated Vital Signs BP 118/78 (BP Location: Left Arm)   Pulse 74   Temp 98.5 F (36.9 C) (Oral)   Resp 18   SpO2 99%   Visual Acuity Right Eye Distance:   Left Eye Distance:   Bilateral Distance:    Right Eye Near:   Left Eye Near:    Bilateral Near:     Physical Exam Vitals reviewed.  Constitutional:      General: She is not in acute distress.    Appearance: Normal appearance. She  is not ill-appearing.  HENT:     Head: Normocephalic and atraumatic.     Right Ear: Tympanic membrane, ear canal and external ear normal. No tenderness. No middle ear effusion. There is no impacted cerumen. Tympanic membrane is not perforated, erythematous, retracted or bulging.     Left Ear: Tympanic membrane, ear canal and external ear normal. No tenderness.  No middle ear effusion. There is no impacted cerumen. Tympanic membrane is not perforated, erythematous, retracted or bulging.     Nose: Nose normal. No congestion.     Mouth/Throat:     Mouth: Mucous membranes are moist.     Pharynx: Uvula midline. No oropharyngeal exudate or posterior oropharyngeal erythema.  Eyes:     Extraocular Movements: Extraocular movements intact.     Pupils: Pupils are equal, round, and reactive to light.  Cardiovascular:     Rate and Rhythm: Normal rate and regular rhythm.     Heart sounds: Normal heart sounds.  Pulmonary:     Effort: Pulmonary effort is normal.     Breath sounds: Normal breath sounds. No decreased breath sounds, wheezing, rhonchi or rales.  Abdominal:     Palpations: Abdomen is soft.     Tenderness: There is no abdominal tenderness. There is no guarding or rebound.  Lymphadenopathy:     Cervical: No cervical adenopathy.     Right cervical: No superficial cervical adenopathy.    Left cervical: No superficial cervical adenopathy.  Neurological:     General: No focal deficit present.     Mental Status: She is alert and oriented to person, place, and time.  Psychiatric:        Mood and Affect: Mood normal.        Behavior: Behavior normal.        Thought Content: Thought content normal.        Judgment: Judgment normal.     UC Treatments / Results  Labs (all labs ordered are listed, but only abnormal results are displayed) Labs Reviewed - No data to display  EKG   Radiology No results found.  Procedures Procedures (including critical care time)  Medications Ordered in  UC Medications - No  data to display  Initial Impression / Assessment and Plan / UC Course  I have reviewed the triage vital signs and the nursing notes.  Pertinent labs & imaging results that were available during my care of the patient were reviewed by me and considered in my medical decision making (see chart for details).     This patient is a very pleasant 30 y.o. year old female presenting with viral syndrome- requires note to return to work. Today this pt is afebrile nontachycardic nontachypneic, oxygenating well on room air, no wheezes rhonchi or rales. Continue OTC medications. Cleared to return in 2 days. ED return precautions discussed. Patient verbalizes understanding and agreement.  .   Final Clinical Impressions(s) / UC Diagnoses   Final diagnoses:  Suspected novel influenza A virus infection  Return to work evaluation     Discharge Instructions      -With a virus, you're typically contagious for 5-7 days, or as long as you're having fevers.     ED Prescriptions   None    PDMP not reviewed this encounter.   Rhys Martini, PA-C 08/20/21 [phone]

## 2021-09-30 ENCOUNTER — Ambulatory Visit: Payer: Medicaid Other

## 2021-10-04 ENCOUNTER — Ambulatory Visit: Payer: Medicaid Other

## 2021-10-07 ENCOUNTER — Ambulatory Visit (INDEPENDENT_AMBULATORY_CARE_PROVIDER_SITE_OTHER): Payer: Medicaid Other

## 2021-10-07 ENCOUNTER — Other Ambulatory Visit: Payer: Self-pay

## 2021-10-07 VITALS — BP 136/74 | HR 65 | Ht 69.0 in | Wt 178.0 lb

## 2021-10-07 DIAGNOSIS — Z3042 Encounter for surveillance of injectable contraceptive: Secondary | ICD-10-CM

## 2021-10-07 MED ORDER — MEDROXYPROGESTERONE ACETATE 150 MG/ML IM SUSP
150.0000 mg | Freq: Once | INTRAMUSCULAR | Status: AC
  Administered 2021-10-07: 11:00:00 150 mg via INTRAMUSCULAR

## 2021-10-07 NOTE — Progress Notes (Signed)
Depo Provera 150mg  given IM RUOQ. Pt tolerated well with no adverse side effects. Pt to return to clinic between 12/23/21-01/06/22 for repeat injection. Pt is up to date on yearly exam, due June 2023.

## 2021-12-29 ENCOUNTER — Ambulatory Visit: Payer: Medicaid Other

## 2021-12-31 ENCOUNTER — Ambulatory Visit (INDEPENDENT_AMBULATORY_CARE_PROVIDER_SITE_OTHER): Payer: Medicaid Other | Admitting: Emergency Medicine

## 2021-12-31 ENCOUNTER — Other Ambulatory Visit: Payer: Self-pay

## 2021-12-31 VITALS — BP 127/82 | HR 81 | Wt 197.8 lb

## 2021-12-31 DIAGNOSIS — Z3042 Encounter for surveillance of injectable contraceptive: Secondary | ICD-10-CM | POA: Diagnosis not present

## 2021-12-31 MED ORDER — MEDROXYPROGESTERONE ACETATE 150 MG/ML IM SUSP
150.0000 mg | Freq: Once | INTRAMUSCULAR | Status: AC
  Administered 2021-12-31: 150 mg via INTRAMUSCULAR

## 2021-12-31 NOTE — Progress Notes (Signed)
Date last pap: 04-13-21. ?Last Depo-Provera: 10-07-21. ?Side Effects if any: No. ?Serum HCG indicated? No. ?Depo-Provera 150 mg IM given by: Herbie Baltimore, RN. Given in left upper outer quadrant, tolerated well.  ?Next appointment due June 2-June 16.  ?

## 2022-01-04 NOTE — Progress Notes (Signed)
Patient was assessed and managed by nursing staff during this encounter. I have reviewed the chart and agree with the documentation and plan. I have also made any necessary editorial changes. ? ?Scheryl Darter, MD ?01/04/2022 1:19 PM  ? ?

## 2022-01-06 ENCOUNTER — Other Ambulatory Visit (HOSPITAL_COMMUNITY)
Admission: RE | Admit: 2022-01-06 | Discharge: 2022-01-06 | Disposition: A | Discharge status: Home / Self Care | Payer: Medicaid Other | Source: Ambulatory Visit | Attending: Obstetrics and Gynecology | Admitting: Obstetrics and Gynecology

## 2022-01-06 ENCOUNTER — Other Ambulatory Visit: Payer: Self-pay

## 2022-01-06 ENCOUNTER — Ambulatory Visit (INDEPENDENT_AMBULATORY_CARE_PROVIDER_SITE_OTHER): Payer: Medicaid Other | Admitting: *Deleted

## 2022-01-06 VITALS — BP 119/77 | HR 80 | Ht 69.0 in | Wt 200.8 lb

## 2022-01-06 DIAGNOSIS — Z113 Encounter for screening for infections with a predominantly sexual mode of transmission: Secondary | ICD-10-CM | POA: Diagnosis present

## 2022-01-06 DIAGNOSIS — N898 Other specified noninflammatory disorders of vagina: Secondary | ICD-10-CM | POA: Diagnosis not present

## 2022-01-06 NOTE — Progress Notes (Signed)
GYN Reports bad odor and watery, clear vaginal discharge Would like metrogel not flagyl if positive for BV Request STI testing  SUBJECTIVE:  31 y.o. female who desires a STI screen. Denies abnormal vaginal, bleeding or significant pelvic pain. No UTI symptoms. Denies history of known exposure to STD.  No LMP recorded. Patient has had an injection.  OBJECTIVE:  She appears well.   ASSESSMENT:  STI Screen   PLAN:  Pt offered STI blood screening-requested GC, chlamydia, and trichomonas probe sent to lab.  Treatment: To be determined once lab results are received.  Pt follow up as needed.

## 2022-01-07 LAB — RPR: RPR Ser Ql: NONREACTIVE

## 2022-01-07 LAB — CERVICOVAGINAL ANCILLARY ONLY
Bacterial Vaginitis (gardnerella): POSITIVE — AB
Candida Glabrata: NEGATIVE
Candida Vaginitis: NEGATIVE
Chlamydia: NEGATIVE
Comment: NEGATIVE
Comment: NEGATIVE
Comment: NEGATIVE
Comment: NEGATIVE
Comment: NEGATIVE
Comment: NORMAL
Neisseria Gonorrhea: NEGATIVE
Trichomonas: NEGATIVE

## 2022-01-07 LAB — HEPATITIS C ANTIBODY: Hep C Virus Ab: NONREACTIVE

## 2022-01-07 LAB — HEPATITIS B SURFACE ANTIGEN: Hepatitis B Surface Ag: NEGATIVE

## 2022-01-07 LAB — HIV ANTIBODY (ROUTINE TESTING W REFLEX): HIV Screen 4th Generation wRfx: NONREACTIVE

## 2022-01-10 ENCOUNTER — Telehealth: Payer: Self-pay

## 2022-01-10 ENCOUNTER — Other Ambulatory Visit: Payer: Self-pay

## 2022-01-10 DIAGNOSIS — B9689 Other specified bacterial agents as the cause of diseases classified elsewhere: Secondary | ICD-10-CM

## 2022-01-10 MED ORDER — METRONIDAZOLE 0.75 % VA GEL: 1.0000 | Freq: Every day | VAGINAL | 0 refills | Status: DC

## 2022-01-10 NOTE — Progress Notes (Signed)
Pt reports she has tolerated Metronidazole gel for BV in the past. Pt does not want pills.  ?

## 2022-01-10 NOTE — Telephone Encounter (Signed)
LVM for pt to cb.

## 2022-01-11 ENCOUNTER — Encounter: Payer: Self-pay | Admitting: *Deleted

## 2022-03-25 ENCOUNTER — Ambulatory Visit: Payer: Medicaid Other

## 2022-03-30 ENCOUNTER — Ambulatory Visit (INDEPENDENT_AMBULATORY_CARE_PROVIDER_SITE_OTHER): Payer: Medicaid Other

## 2022-03-30 VITALS — Wt 204.2 lb

## 2022-03-30 DIAGNOSIS — Z3042 Encounter for surveillance of injectable contraceptive: Secondary | ICD-10-CM | POA: Diagnosis not present

## 2022-03-30 MED ORDER — MEDROXYPROGESTERONE ACETATE 150 MG/ML IM SUSP
150.0000 mg | INTRAMUSCULAR | Status: AC
  Administered 2022-03-30: 150 mg via INTRAMUSCULAR

## 2022-03-30 NOTE — Progress Notes (Signed)
Pt is in the office for depo injection, administered in RUOQ and pt tolerated well. Next due Aug 30- Sept 13. .. Administrations This Visit     medroxyPROGESTERone (DEPO-PROVERA) injection 150 mg     Admin Date 03/30/2022 Action Given Dose 150 mg Route Intramuscular Administered By Katrina Stack, RN           Last annual was 04/13/22, advised pt to schedule

## 2022-03-30 NOTE — Progress Notes (Signed)
.  cwhrn °

## 2022-06-15 ENCOUNTER — Other Ambulatory Visit: Payer: Self-pay | Admitting: Obstetrics

## 2022-06-15 ENCOUNTER — Ambulatory Visit: Payer: Medicaid Other | Admitting: Student

## 2022-06-15 DIAGNOSIS — Z30013 Encounter for initial prescription of injectable contraceptive: Secondary | ICD-10-CM

## 2022-06-16 ENCOUNTER — Ambulatory Visit (INDEPENDENT_AMBULATORY_CARE_PROVIDER_SITE_OTHER): Payer: Medicaid Other | Admitting: Emergency Medicine

## 2022-06-16 VITALS — BP 120/78 | HR 77 | Ht 69.0 in | Wt 207.7 lb

## 2022-06-16 DIAGNOSIS — Z3042 Encounter for surveillance of injectable contraceptive: Secondary | ICD-10-CM | POA: Diagnosis not present

## 2022-06-16 MED ORDER — MEDROXYPROGESTERONE ACETATE 150 MG/ML IM SUSP
150.0000 mg | Freq: Once | INTRAMUSCULAR | Status: AC
  Administered 2022-06-16: 150 mg via INTRAMUSCULAR

## 2022-06-16 NOTE — Progress Notes (Signed)
Date last pap: 04/13/2021. Last Depo-Provera: 03/30/2022. Side Effects if any: Weight Gain. Serum HCG indicated? NA. Depo-Provera 150 mg IM given by: Resa Miner, RN into LUOQ, tolerated well. Next appointment due Nov 16- Nov 30.   Office stock used.

## 2022-08-16 ENCOUNTER — Encounter: Payer: Self-pay | Admitting: Student

## 2022-08-16 ENCOUNTER — Ambulatory Visit (INDEPENDENT_AMBULATORY_CARE_PROVIDER_SITE_OTHER): Payer: Medicaid Other | Admitting: Student

## 2022-08-16 ENCOUNTER — Other Ambulatory Visit (HOSPITAL_COMMUNITY)
Admission: RE | Admit: 2022-08-16 | Discharge: 2022-08-16 | Disposition: A | Discharge status: Home / Self Care | Payer: Medicaid Other | Source: Ambulatory Visit | Attending: Student | Admitting: Student

## 2022-08-16 VITALS — BP 129/87 | HR 70 | Ht 69.0 in | Wt 216.2 lb

## 2022-08-16 DIAGNOSIS — Z01419 Encounter for gynecological examination (general) (routine) without abnormal findings: Secondary | ICD-10-CM | POA: Diagnosis not present

## 2022-08-16 DIAGNOSIS — Z1151 Encounter for screening for human papillomavirus (HPV): Secondary | ICD-10-CM | POA: Diagnosis not present

## 2022-08-16 NOTE — Progress Notes (Signed)
Patient presents for annual exam. Denies any abnormal vaginal discharge, odor, pain. Denies any changes to her breast. No other concerns at this time. Declined flu vaccine.

## 2022-08-16 NOTE — Progress Notes (Signed)
ANNUAL EXAM Patient name: IVALEE STRAUSER MRN [id number]  Date of birth: [birth year] Chief Complaint:   Annual Exam  History of Present Illness:   Lauren Mcclain is a 31 y.o. [phone] African-American female being seen today for a routine annual exam.  Current complaints: None  No LMP recorded. Patient has had an injection. Reports that the depo shot is meeting patient needs.  The pregnancy intention screening data noted above was reviewed. Potential methods of contraception were discussed. The patient elected to continue with Depo.  Last pap 2022. Results were: NILM w/ HRHPV not done. H/O abnormal pap: no Last mammogram: N/A. Results were: N/A. Family h/o breast cancer: no Last colonoscopy: N/A. Results were: N/A. Family h/o colorectal cancer: no     08/16/2022    9:49 AM  Depression screen PHQ 2/9  Decreased Interest 0  Down, Depressed, Hopeless 0  PHQ - 2 Score 0  Altered sleeping 0  Tired, decreased energy 0  Change in appetite 0  Feeling bad or failure about yourself  0  Trouble concentrating 0  Moving slowly or fidgety/restless 0  Suicidal thoughts 0  PHQ-9 Score 0        08/16/2022    9:50 AM 11/06/2019   10:33 AM  GAD 7 : Generalized Anxiety Score  Nervous, Anxious, on Edge 0 0  Control/stop worrying 0 0  Worry too much - different things 0 0  Trouble relaxing 0 0  Restless 0 0  Easily annoyed or irritable 0 0  Afraid - awful might happen 0 0  Total GAD 7 Score 0 0  Anxiety Difficulty  Not difficult at all     Review of Systems:   Pertinent items are noted in HPI Denies any headaches, blurred vision, fatigue, shortness of breath, chest pain, abdominal pain, abnormal vaginal discharge/itching/odor/irritation, problems with periods, bowel movements, urination, or intercourse unless otherwise stated above. Pertinent History Reviewed:  Reviewed past medical,surgical, social and family history.  Reviewed problem list, medications and allergies. Physical  Assessment:   Vitals:   08/16/22 0945  BP: 129/87  Pulse: 70  Weight: 216 lb 3.2 oz (98.1 kg)  Height: 5\' 9"  (1.753 m)  Body mass index is 31.93 kg/m.        Physical Examination:   General appearance - well appearing, and in no distress  Mental status - alert, oriented to person, place, and time  Psych:  She has a normal mood and affect  Skin - warm and dry, normal color, no suspicious lesions noted  Chest - effort normal, all lung fields clear to auscultation bilaterally  Heart - normal rate and regular rhythm  Neck:  midline trachea, no thyromegaly or nodules  Breasts - breasts appear normal, no suspicious masses, no skin or nipple changes or axillary nodes  Abdomen - soft, nontender, nondistended, no masses or organomegaly  Pelvic - VULVA: normal appearing vulva with no masses, tenderness or lesions   VAGINA: normal appearing vagina with normal color and discharge, no lesions    CERVIX: normal appearing cervix without discharge or lesions, no CMT   Thin prep pap is done w/ HR HPV cotesting   UTERUS: uterus is felt to be normal size and nontender    ADNEXA: No adnexal masses or tenderness noted.  Rectal - normal rectal, good sphincter tone, no masses felt.   Extremities:  No swelling or varicosities noted  Chaperone present for exam  No results found for this or any previous visit (from the  past 24 hour(s)).  Assessment & Plan:  1. Women's annual routine gynecological examination 2. Encounter for gynecological examination with Papanicolaou smear of cervix 3. Encounter for screening for human papillomavirus (HPV) - Per ASCCP guidelines, HPV testing completed today - Normal exam.  - STI screening completed today  Labs/procedures today: Pap collected  Mammogram: @ 29yo, or sooner if problems Colonoscopy: @ 29yo, or sooner if problems  Orders Placed This Encounter  Procedures   RPR   HIV Antibody (routine testing w rflx)   Hepatitis C antibody   Hepatitis B surface  antigen    Meds: No orders of the defined types were placed in this encounter.   Follow-up: Return for DEPO.  Corlis Hove, NP 08/16/2022 10:12 AM

## 2022-08-17 LAB — HIV ANTIBODY (ROUTINE TESTING W REFLEX): HIV Screen 4th Generation wRfx: NONREACTIVE

## 2022-08-17 LAB — RPR: RPR Ser Ql: NONREACTIVE

## 2022-08-17 LAB — HEPATITIS B SURFACE ANTIGEN: Hepatitis B Surface Ag: NEGATIVE

## 2022-08-17 LAB — HEPATITIS C ANTIBODY: Hep C Virus Ab: NONREACTIVE

## 2022-08-18 LAB — CYTOLOGY - PAP
Chlamydia: NEGATIVE
Comment: NEGATIVE
Comment: NEGATIVE
Comment: NEGATIVE
Comment: NORMAL
Diagnosis: NEGATIVE
High risk HPV: NEGATIVE
Neisseria Gonorrhea: NEGATIVE
Trichomonas: NEGATIVE

## 2022-09-06 ENCOUNTER — Ambulatory Visit: Payer: Medicaid Other

## 2022-09-15 ENCOUNTER — Ambulatory Visit (INDEPENDENT_AMBULATORY_CARE_PROVIDER_SITE_OTHER): Payer: Medicaid Other | Admitting: *Deleted

## 2022-09-15 VITALS — BP 135/77 | HR 72 | Wt 222.0 lb

## 2022-09-15 DIAGNOSIS — Z3042 Encounter for surveillance of injectable contraceptive: Secondary | ICD-10-CM | POA: Diagnosis not present

## 2022-09-15 MED ORDER — MEDROXYPROGESTERONE ACETATE 150 MG/ML IM SUSP
150.0000 mg | Freq: Once | INTRAMUSCULAR | Status: AC
  Administered 2022-09-15: 150 mg via INTRAMUSCULAR

## 2022-09-15 NOTE — Progress Notes (Signed)
Date last pap: 08/16/22. Last Depo-Provera: 06/16/22. Side Effects if any: NA. Serum HCG indicated? NA. Depo-Provera 150 mg IM given by: Montez Morita, RNC in RUO glut. Next appointment due 12/01/22-12/16/22.

## 2022-12-08 ENCOUNTER — Ambulatory Visit: Payer: Medicaid Other

## 2023-06-06 ENCOUNTER — Other Ambulatory Visit (HOSPITAL_COMMUNITY)
Admission: RE | Admit: 2023-06-06 | Discharge: 2023-06-06 | Disposition: A | Discharge status: Home / Self Care | Payer: Medicaid Other | Source: Ambulatory Visit | Attending: Obstetrics and Gynecology | Admitting: Obstetrics and Gynecology

## 2023-06-06 ENCOUNTER — Ambulatory Visit (INDEPENDENT_AMBULATORY_CARE_PROVIDER_SITE_OTHER): Payer: Medicaid Other | Admitting: Emergency Medicine

## 2023-06-06 VITALS — BP 141/95 | HR 73 | Ht 69.0 in | Wt 215.5 lb

## 2023-06-06 DIAGNOSIS — N898 Other specified noninflammatory disorders of vagina: Secondary | ICD-10-CM | POA: Insufficient documentation

## 2023-06-06 NOTE — Progress Notes (Signed)
SUBJECTIVE:  32 y.o. female reports unprotected intercourse last week. Endorses vaginal itching for couple of weeks.  Denies abnormal vaginal bleeding or significant pelvic pain or fever. No UTI symptoms. Denies history of known exposure to STD.  No LMP recorded. Patient has had an injection.  OBJECTIVE:  She appears well, afebrile. Urine dipstick: not done.  ASSESSMENT:  Vaginal Discharge  Vaginal Odor   PLAN:  GC, chlamydia, trichomonas, BVAG, CVAG probe sent to lab. Treatment: To be determined once lab results are received ROV prn if symptoms persist or worsen.  BP elevated today. Advised to seek PCP

## 2023-06-07 ENCOUNTER — Other Ambulatory Visit: Payer: Self-pay

## 2023-06-07 DIAGNOSIS — B9689 Other specified bacterial agents as the cause of diseases classified elsewhere: Secondary | ICD-10-CM

## 2023-06-07 LAB — CERVICOVAGINAL ANCILLARY ONLY
Bacterial Vaginitis (gardnerella): POSITIVE — AB
Candida Glabrata: NEGATIVE
Candida Vaginitis: NEGATIVE
Chlamydia: NEGATIVE
Comment: NEGATIVE
Comment: NEGATIVE
Comment: NEGATIVE
Comment: NEGATIVE
Comment: NEGATIVE
Comment: NORMAL
Neisseria Gonorrhea: NEGATIVE
Trichomonas: NEGATIVE

## 2023-06-07 MED ORDER — METRONIDAZOLE 0.75 % VA GEL: 1.0000 | Freq: Every day | VAGINAL | 0 refills | Status: DC

## 2023-08-21 ENCOUNTER — Ambulatory Visit: Payer: Medicaid Other | Admitting: Advanced Practice Midwife

## 2023-08-21 ENCOUNTER — Other Ambulatory Visit (HOSPITAL_COMMUNITY)
Admission: RE | Admit: 2023-08-21 | Discharge: 2023-08-21 | Disposition: A | Discharge status: Home / Self Care | Payer: Medicaid Other | Source: Ambulatory Visit | Attending: Advanced Practice Midwife | Admitting: Advanced Practice Midwife

## 2023-08-21 ENCOUNTER — Encounter: Payer: Self-pay | Admitting: Advanced Practice Midwife

## 2023-08-21 VITALS — BP 125/75 | HR 80 | Ht 69.0 in | Wt 217.6 lb

## 2023-08-21 DIAGNOSIS — N760 Acute vaginitis: Secondary | ICD-10-CM

## 2023-08-21 DIAGNOSIS — Z01419 Encounter for gynecological examination (general) (routine) without abnormal findings: Secondary | ICD-10-CM | POA: Diagnosis not present

## 2023-08-21 DIAGNOSIS — Z113 Encounter for screening for infections with a predominantly sexual mode of transmission: Secondary | ICD-10-CM

## 2023-08-21 DIAGNOSIS — Z803 Family history of malignant neoplasm of breast: Secondary | ICD-10-CM

## 2023-08-21 DIAGNOSIS — B9689 Other specified bacterial agents as the cause of diseases classified elsewhere: Secondary | ICD-10-CM

## 2023-08-21 DIAGNOSIS — N6321 Unspecified lump in the left breast, upper outer quadrant: Secondary | ICD-10-CM | POA: Diagnosis not present

## 2023-08-21 DIAGNOSIS — Z124 Encounter for screening for malignant neoplasm of cervix: Secondary | ICD-10-CM | POA: Diagnosis not present

## 2023-08-21 NOTE — Progress Notes (Signed)
Pt presents for AEX.  Last PAP 08-16-22 Requesting PAP and STD testing.

## 2023-08-21 NOTE — Progress Notes (Signed)
Subjective:     Lauren Mcclain is a 32 y.o. female here at [facility] for a routine exam.  Current complaints: none, but does report irregular menses for almost 1 year following last Depo injection.  Menses last month so hopefully she will get back on track.  Periods were regular before Depo.  Personal and family health history reviewed: yes.  Do you have a primary care provider? yes Do you feel safe at home? yes  Flowsheet Row Office Visit from 08/16/2022 in [facility] at [facility] Total Score 0       Health Maintenance Due  Topic Date Due   INFLUENZA VACCINE  05/18/2023   COVID-19 Vaccine (1 - 2023-24 season) Never done     Risk factors for chronic health problems: Smoking: Alchohol/how much: Pt BMI: Body mass index is 32.13 kg/m.   Gynecologic History No LMP recorded. Contraception: abstinence Last Pap: 07/2022. Results were: normal Last mammogram: n/a.  Obstetric History OB History  Gravida Para Term Preterm AB Living  4 4 4     4   SAB IAB Ectopic Multiple Live Births        0 4    # Outcome Date GA Lbr Len/2nd Weight Sex Type Anes PTL Lv  4 Term 04/10/18 [redacted]w[redacted]d 02:58 / 00:07 6 lb 14.2 oz (3.125 kg) F Vag-Vacuum EPI  LIV  3 Term 04/04/17 [redacted]w[redacted]d 07:03 / 00:15 6 lb 0.7 oz (2.741 kg) M Vag-Spont EPI  LIV  2 Term 06/14/14 [redacted]w[redacted]d -15:48 / 00:28 6 lb 2.9 oz (2.805 kg) F Vag-Spont EPI  LIV  1 Term 01/04/13 [redacted]w[redacted]d 452:31 / 01:11 8 lb 1.1 oz (3.66 kg) M Vag-Vacuum EPI  LIV     The following portions of the patient's history were reviewed and updated as appropriate: allergies, current medications, past family history, past medical history, past social history, past surgical history, and problem list.  Review of Systems Pertinent items noted in HPI and remainder of comprehensive ROS otherwise negative.    Objective:  BP 125/75   Pulse 80   Ht 5\' 9"  (1.753 m)   Wt 217 lb 9.6 oz (98.7 kg)   BMI 32.13 kg/m   VS reviewed, nursing note  reviewed,  Constitutional: well developed, well nourished, no distress HEENT: normocephalic, thyroid without enlargement or mass HEART: RRR, no murmurs rubs/gallops RESP: clear and equal to auscultation bilaterally in all lobes  Breast Exam:  exam performed: right breast normal without mass, skin or nipple changes or axillary nodes, left breast normal without mass, skin or nipple changes or axillary nodes Abdomen: soft Neuro: alert and oriented x 3 Skin: warm, dry Psych: affect normal Pelvic exam:  Bimanual exam: Cervix 0/long/high, firm, anterior, neg CMT, uterus nontender, nonenlarged, adnexa without tenderness, enlargement, or mass        Assessment/Plan:   1. Encounter for annual routine gynecological examination --Irregular menses after Depo, but menses last month so pt hopefully things are regulating.  Not currently sexually active so abstinence for contraception. Will follow up if more is needed.  --Pap due in 2026  2. Routine screening for STI (sexually transmitted infection)  - HIV antibody (with reflex) - RPR - Hepatitis C Antibody - Hepatitis B Surface AntiGEN - Cervicovaginal ancillary only( [place])  3. Mass of upper outer quadrant of left breast --fibrous tissue, fibrocystic breast, more on left than right.  Smooth, mobile, [place].  Pt does not have great relationship with her birth  mother but found out she had breast cancer in the last 2 years.  Will follow with imaging.  - [place] LIMITED ULTRASOUND INCLUDING AXILLA LEFT BREAST ; Future - MM 3D DIAGNOSTIC MAMMOGRAM UNILATERAL LEFT BREAST; Future     4. Family history of breast cancer in mother  --Over age 43 per pt, unsure of BRCA testing since she does not have relationship with her mother  Return in about 1 year (around 08/20/2024).   Sharen Counter, CNM 4:30 PM

## 2023-08-22 LAB — CERVICOVAGINAL ANCILLARY ONLY
Bacterial Vaginitis (gardnerella): POSITIVE — AB
Candida Glabrata: NEGATIVE
Candida Vaginitis: NEGATIVE
Chlamydia: NEGATIVE
Comment: NEGATIVE
Comment: NEGATIVE
Comment: NEGATIVE
Comment: NEGATIVE
Comment: NEGATIVE
Comment: NORMAL
Neisseria Gonorrhea: NEGATIVE
Trichomonas: NEGATIVE

## 2023-08-22 LAB — RPR: RPR Ser Ql: NONREACTIVE

## 2023-08-22 LAB — HEPATITIS C ANTIBODY: Hep C Virus Ab: NONREACTIVE

## 2023-08-22 LAB — HEPATITIS B SURFACE ANTIGEN: Hepatitis B Surface Ag: NEGATIVE

## 2023-08-22 LAB — HIV ANTIBODY (ROUTINE TESTING W REFLEX): HIV Screen 4th Generation wRfx: NONREACTIVE

## 2023-08-22 MED ORDER — NUVESSA 1.3 % VA GEL: 1.0000 | Freq: Once | VAGINAL | 0 refills | Status: DC

## 2023-08-22 NOTE — Addendum Note (Signed)
Addended by: Sharen Counter A on: 08/22/2023 06:46 PM   Modules accepted: Orders

## 2023-09-07 ENCOUNTER — Ambulatory Visit
Admission: RE | Admit: 2023-09-07 | Discharge: 2023-09-07 | Disposition: A | Discharge status: Home / Self Care | Payer: Medicaid Other | Source: Ambulatory Visit | Attending: Advanced Practice Midwife | Admitting: Advanced Practice Midwife

## 2023-09-07 ENCOUNTER — Other Ambulatory Visit: Payer: Self-pay | Admitting: Advanced Practice Midwife

## 2023-09-07 DIAGNOSIS — N6321 Unspecified lump in the left breast, upper outer quadrant: Secondary | ICD-10-CM

## 2023-09-07 DIAGNOSIS — N6310 Unspecified lump in the right breast, unspecified quadrant: Secondary | ICD-10-CM

## 2023-10-16 ENCOUNTER — Ambulatory Visit: Payer: Medicaid Other

## 2023-11-28 ENCOUNTER — Encounter: Payer: Self-pay | Admitting: *Deleted

## 2023-12-05 ENCOUNTER — Other Ambulatory Visit: Payer: Self-pay | Admitting: *Deleted

## 2023-12-05 DIAGNOSIS — B9689 Other specified bacterial agents as the cause of diseases classified elsewhere: Secondary | ICD-10-CM

## 2023-12-05 MED ORDER — NUVESSA 1.3 % VA GEL: 1.0000 | Freq: Once | VAGINAL | 0 refills | Status: AC

## 2023-12-18 ENCOUNTER — Ambulatory Visit: Payer: Medicaid Other

## 2024-01-01 ENCOUNTER — Ambulatory Visit

## 2024-01-13 ENCOUNTER — Encounter (HOSPITAL_COMMUNITY): Payer: Self-pay

## 2024-01-13 ENCOUNTER — Ambulatory Visit (INDEPENDENT_AMBULATORY_CARE_PROVIDER_SITE_OTHER)

## 2024-01-13 ENCOUNTER — Ambulatory Visit (HOSPITAL_COMMUNITY): Admission: EM | Admit: 2024-01-13 | Discharge: 2024-01-13 | Disposition: A | Discharge status: Home / Self Care

## 2024-01-13 DIAGNOSIS — R0600 Dyspnea, unspecified: Secondary | ICD-10-CM | POA: Diagnosis present

## 2024-01-13 DIAGNOSIS — R109 Unspecified abdominal pain: Secondary | ICD-10-CM | POA: Diagnosis present

## 2024-01-13 LAB — COMPREHENSIVE METABOLIC PANEL WITH GFR
ALT: 24 U/L (ref 0–44)
AST: 19 U/L (ref 15–41)
Albumin: 3.8 g/dL (ref 3.5–5.0)
Alkaline Phosphatase: 46 U/L (ref 38–126)
Anion gap: 8 (ref 5–15)
BUN: 11 mg/dL (ref 6–20)
CO2: 25 mmol/L (ref 22–32)
Calcium: 9.4 mg/dL (ref 8.9–10.3)
Chloride: 105 mmol/L (ref 98–111)
Creatinine, Ser: 0.84 mg/dL (ref 0.44–1.00)
GFR, Estimated: >60 mL/min (ref >60)
Glucose, Bld: 95 mg/dL (ref 70–99)
Potassium: 4.4 mmol/L (ref 3.5–5.1)
Sodium: 138 mmol/L (ref 135–145)
Total Bilirubin: 0.5 mg/dL (ref 0.0–1.2)
Total Protein: 7.5 g/dL (ref 6.5–8.1)

## 2024-01-13 LAB — POCT URINALYSIS DIP (MANUAL ENTRY)
Bilirubin, UA: NEGATIVE
Glucose, UA: NEGATIVE mg/dL
Ketones, POC UA: NEGATIVE mg/dL
Leukocytes, UA: NEGATIVE
Nitrite, UA: NEGATIVE
Protein Ur, POC: NEGATIVE mg/dL
Spec Grav, UA: 1.015 (ref 1.010–1.025)
Urobilinogen, UA: 0.2 U/dL
pH, UA: 7.5 (ref 5.0–8.0)

## 2024-01-13 LAB — HCG, SERUM, QUALITATIVE: Preg, Serum: NEGATIVE

## 2024-01-13 LAB — CBC
HCT: 40.9 % (ref 36.0–46.0)
Hemoglobin: 13.6 g/dL (ref 12.0–15.0)
MCH: 30.2 pg (ref 26.0–34.0)
MCHC: 33.3 g/dL (ref 30.0–36.0)
MCV: 90.9 fL (ref 80.0–100.0)
Platelets: 202 10*3/uL (ref 150–400)
RBC: 4.5 MIL/uL (ref 3.87–5.11)
RDW: 12.4 % (ref 11.5–15.5)
WBC: 8.2 10*3/uL (ref 4.0–10.5)
nRBC: 0 % (ref 0.0–0.2)

## 2024-01-13 LAB — POCT URINE PREGNANCY: Preg Test, Ur: NEGATIVE

## 2024-01-13 NOTE — ED Triage Notes (Signed)
 Patient here today with c/o abd cramping X 1 month. LMP 12/07/2023. Patient took a home pregnancy tested and was negative.   Patient has also been having some SOB and fatigue X 1 month.

## 2024-01-13 NOTE — ED Provider Notes (Signed)
 [facility]  Note:  This document was prepared using Dragon voice recognition software and may include unintentional dictation errors.  MRN: [id number] DOB: Apr 17, 1991  Subjective:   Lauren Mcclain is a 33 y.o. female presenting for lower abdominal cramping and vaginal discharge x 1 month.  Patient concern for possible pregnancy reports her last menstrual period was 12/07/2023.  Patient reports after stopping Depo injection her periods have been every month around the 20th.  Patient reports taking a home pregnancy test which was negative.  Patient contacted OB/GYN who stated that they would not see her until her symptoms have persisted for a month.  Patient would like urine pregnancy testing and urinalysis performed in UC.  Patient also reports intermittent severe fatigue with exertion x 1 month.  Patient states doing normal daily activities at work caused her to become extremely tired which is abnormal for her.  Patient thought it might be related to being pregnant but states that pregnancy test have been negative.  Patient denies any shortness of breath, chest pain, weakness, dizziness right now.   Current Facility-Administered Medications:    medroxyPROGESTERone (DEPO-PROVERA) injection 150 mg, 150 mg, Intramuscular, Q90 days, Hermina Staggers, MD, 150 mg at 03/30/22 1054 No current outpatient medications on file.   Allergies  Allergen Reactions   Metronidazole Diarrhea    Reports can use metronidazole gel without problems    Past Medical History:  Diagnosis Date   Chlamydia    Lyme disease    Medical history non-contributory    Trichomonas contact, treated      Past Surgical History:  Procedure Laterality Date   NO PAST SURGERIES      Family History  Problem Relation Age of Onset   Drug abuse Mother    Breast cancer Mother    Drug abuse Father     Social History   Tobacco Use   Smoking status: Former    Types: Cigars    Quit date: 2018    Years since  quitting: 7.2   Smokeless tobacco: Never   Tobacco comments:    black and mild; 1/d  Substance Use Topics   Alcohol use: Yes    Comment: approx 2 days a week   Drug use: Yes    Frequency: 28.0 times per week    Types: Marijuana    Comment: daily    ROS Refer to HPI for ROS details.  Objective:   Vitals: BP 126/81 (BP Location: Right Arm)   Pulse 70   Temp 98.2 F (36.8 C) (Oral)   Resp 16   Ht 5\' 9"  (1.753 m)   Wt 210 lb (95.3 kg)   LMP 12/07/2023 (Exact Date)   SpO2 99%   BMI 31.01 kg/m   Physical Exam Vitals and nursing note reviewed.  Constitutional:      General: She is not in acute distress.    Appearance: She is well-developed. She is not ill-appearing or toxic-appearing.  HENT:     Head: Normocephalic.  Eyes:     Conjunctiva/sclera: Conjunctivae normal.  Cardiovascular:     Rate and Rhythm: Normal rate.  Pulmonary:     Effort: Pulmonary effort is normal. No respiratory distress.     Breath sounds: No stridor. No wheezing, rhonchi or rales.  Abdominal:     Palpations: Abdomen is soft.     Tenderness: There is no abdominal tenderness. There is no right CVA tenderness, left CVA tenderness, guarding or rebound.  Musculoskeletal:  General: No swelling.     Cervical back: Neck supple.  Skin:    General: Skin is warm and dry.  Neurological:     General: No focal deficit present.     Mental Status: She is alert and oriented to person, place, and time.  Psychiatric:        Mood and Affect: Mood normal.     Procedures  Results for orders placed or performed during the hospital encounter of 01/13/24 (from the past 24 hours)  POCT urine pregnancy     Status: None   Collection Time: 01/13/24  2:17 PM  Result Value Ref Range   Preg Test, Ur Negative Negative  POCT urinalysis dipstick     Status: Abnormal   Collection Time: 01/13/24  2:18 PM  Result Value Ref Range   Color, UA light yellow (A) yellow   Clarity, UA clear clear   Glucose, UA  negative negative mg/dL   Bilirubin, UA negative negative   Ketones, POC UA negative negative mg/dL   Spec Grav, UA 1.610 9.604 - 1.025   Blood, UA trace-intact (A) negative   pH, UA 7.5 5.0 - 8.0   Protein Ur, POC negative negative mg/dL   Urobilinogen, UA 0.2 0.2 or 1.0 E.U./dL   Nitrite, UA Negative Negative   Leukocytes, UA Negative Negative    Assessment and Plan :   PDMP not reviewed this encounter.  1. Dyspnea, unspecified type   2. Abdominal cramping    1. Dyspnea, unspecified type (Primary) - DG Chest 2 View x-ray shows no acute cardiopulmonary processes, no sign of consolidation or pneumonia - CBC collected and sent to lab results should be available tomorrow - Comprehensive metabolic panel collected and sent to lab results should be available by tomorrow.  2. Abdominal cramping - POCT urinalysis dipstick completed in UC shows no leukocytes, no nitrite, trace blood, no sign of urinary tract infection - POCT urine pregnancy hCG negative - hCG, serum, qualitative collected and sent to lab - Urine Culture sent to lab for further testing - Cervicovaginal collected in UC and sent to lab for further testing. -Continue to monitor symptoms for any change in severity if there is any escalation of current symptoms or development of new symptoms follow-up in ER for further evaluation and management.  Lucky Cowboy   [place], [place], [place] 01/13/24 1511

## 2024-01-13 NOTE — Discharge Instructions (Signed)
 1. Dyspnea, unspecified type (Primary) - DG Chest 2 View x-ray shows no acute cardiopulmonary processes, no sign of consolidation or pneumonia - CBC collected and sent to lab results should be available tomorrow - Comprehensive metabolic panel collected and sent to lab results should be available by tomorrow.  2. Abdominal cramping - POCT urinalysis dipstick completed in UC shows no leukocytes, no nitrite, trace blood, no sign of urinary tract infection - POCT urine pregnancy hCG negative - hCG, serum, qualitative collected and sent to lab - Urine Culture sent to lab for further testing - Cervicovaginal collected in UC and sent to lab for further testing.

## 2024-01-14 LAB — URINE CULTURE
Culture: <10000 — AB
Special Requests: NORMAL

## 2024-01-15 LAB — CERVICOVAGINAL ANCILLARY ONLY
Bacterial Vaginitis (gardnerella): NEGATIVE
Candida Glabrata: NEGATIVE
Candida Vaginitis: NEGATIVE
Chlamydia: NEGATIVE
Comment: NEGATIVE
Comment: NEGATIVE
Comment: NEGATIVE
Comment: NEGATIVE
Comment: NEGATIVE
Comment: NORMAL
Neisseria Gonorrhea: NEGATIVE
Trichomonas: NEGATIVE

## 2024-01-17 ENCOUNTER — Encounter (HOSPITAL_COMMUNITY): Payer: Self-pay

## 2024-03-07 ENCOUNTER — Other Ambulatory Visit: Payer: Self-pay | Admitting: Advanced Practice Midwife

## 2024-03-07 ENCOUNTER — Ambulatory Visit
Admission: RE | Admit: 2024-03-07 | Discharge: 2024-03-07 | Disposition: A | Discharge status: Home / Self Care | Payer: Medicaid Other | Source: Ambulatory Visit | Attending: Advanced Practice Midwife | Admitting: Advanced Practice Midwife

## 2024-03-07 DIAGNOSIS — N6310 Unspecified lump in the right breast, unspecified quadrant: Secondary | ICD-10-CM

## 2024-05-08 ENCOUNTER — Telehealth: Admitting: Family Medicine

## 2024-05-08 NOTE — Progress Notes (Signed)
  The patient no-showed for appointment despite this provider sending direct link via phone with no response and waiting for at least 10 minutes from appointment time for patient to join. They will be marked as a NS for this appointment/time.   Olam DELENA Darby, FNP

## 2024-05-21 ENCOUNTER — Ambulatory Visit

## 2024-05-21 DIAGNOSIS — Z113 Encounter for screening for infections with a predominantly sexual mode of transmission: Secondary | ICD-10-CM

## 2024-06-28 ENCOUNTER — Ambulatory Visit: Admitting: Obstetrics and Gynecology

## 2024-06-28 ENCOUNTER — Ambulatory Visit

## 2024-07-01 ENCOUNTER — Ambulatory Visit (INDEPENDENT_AMBULATORY_CARE_PROVIDER_SITE_OTHER): Admitting: Advanced Practice Midwife

## 2024-07-01 ENCOUNTER — Other Ambulatory Visit (HOSPITAL_COMMUNITY)
Admission: RE | Admit: 2024-07-01 | Discharge: 2024-07-01 | Disposition: A | Discharge status: Home / Self Care | Source: Ambulatory Visit | Attending: Advanced Practice Midwife | Admitting: Advanced Practice Midwife

## 2024-07-01 ENCOUNTER — Encounter: Payer: Self-pay | Admitting: Advanced Practice Midwife

## 2024-07-01 VITALS — BP 137/84 | HR 58 | Ht 69.0 in | Wt 206.0 lb

## 2024-07-01 DIAGNOSIS — Z3009 Encounter for other general counseling and advice on contraception: Secondary | ICD-10-CM

## 2024-07-01 DIAGNOSIS — Z3202 Encounter for pregnancy test, result negative: Secondary | ICD-10-CM

## 2024-07-01 DIAGNOSIS — Z113 Encounter for screening for infections with a predominantly sexual mode of transmission: Secondary | ICD-10-CM

## 2024-07-01 DIAGNOSIS — Z3200 Encounter for pregnancy test, result unknown: Secondary | ICD-10-CM | POA: Insufficient documentation

## 2024-07-01 DIAGNOSIS — R109 Unspecified abdominal pain: Secondary | ICD-10-CM | POA: Insufficient documentation

## 2024-07-01 DIAGNOSIS — Z30013 Encounter for initial prescription of injectable contraceptive: Secondary | ICD-10-CM

## 2024-07-01 LAB — POCT URINE PREGNANCY: Preg Test, Ur: NEGATIVE

## 2024-07-01 MED ORDER — MEDROXYPROGESTERONE ACETATE 150 MG/ML IM SUSP
150.0000 mg | Freq: Once | INTRAMUSCULAR | Status: AC
  Administered 2024-07-01: 150 mg via INTRAMUSCULAR

## 2024-07-01 MED ORDER — MEDROXYPROGESTERONE ACETATE 150 MG/ML IM SUSP: 150.0000 mg | INTRAMUSCULAR | 3 refills | Status: AC

## 2024-07-01 NOTE — Progress Notes (Signed)
   GYNECOLOGY PROGRESS NOTE  History:  33 y.o. [id number] presents to [facility] office today for contraceptive visit .  She wants to restart Depo Provera . She was last on the medication in 2023.  She denies h/a, dizziness, shortness of breath, n/v, or fever/chills.    The following portions of the patient's history were reviewed and updated as appropriate: allergies, current medications, past family history, past medical history, past social history, past surgical history and problem list. Last pap smear on 07/2022 was normal.  Health Maintenance Due  Topic Date Due   Hepatitis B Vaccines 19-59 Average Risk (1 of 3 - 19+ 3-dose series) Never done   HPV VACCINES (1 - 3-dose SCDM series) Never done   Influenza Vaccine  05/17/2024   COVID-19 Vaccine (1 - 2024-25 season) Never done     Review of Systems:  Pertinent items are noted in HPI.   Objective:  Physical Exam Blood pressure 137/84, pulse (!) 58, height 5' 9 (1.753 m), weight 206 lb (93.4 kg), last menstrual period 06/17/2024. VS reviewed, nursing note reviewed,  Constitutional: well developed, well nourished, no distress HEENT: normocephalic CV: normal rate Pulm/chest wall: normal effort Breast Exam: deferred Abdomen: soft Neuro: alert and oriented x 3 Skin: warm, dry Psych: affect normal Pelvic exam: Cervix pink, visually closed, without lesion, scant white creamy discharge, vaginal walls and external genitalia normal Bimanual exam: Cervix 0/long/high, firm, anterior, neg CMT, uterus nontender, nonenlarged, adnexa without tenderness, enlargement, or mass  Assessment & Plan:  1. Encounter for counseling regarding contraception (Primary) --Discussed pt contraceptive plans and reviewed contraceptive methods based on pt preferences and effectiveness.  Pt prefers Depo Provera  - POCT urine pregnancy - medroxyPROGESTERone  (DEPO-PROVERA ) injection 150 mg - medroxyPROGESTERone  (DEPO-PROVERA ) 150 MG/ML injection; Inject 1 mL (150 mg  total) into the muscle every 3 (three) months.  Dispense: 1 mL; Refill: 3  2. Routine screening for STI (sexually transmitted infection)  - Cervicovaginal ancillary only( [place])   No follow-ups on file.   Olam Boards, CNM 8:56 PM

## 2024-07-01 NOTE — Progress Notes (Signed)
 Last Depo about 2 years ago. Wants to restart. Last used 2023. Currently sexually active. Using withdrawal method at times. Wants STI testing today. Only swab.

## 2024-07-03 LAB — CERVICOVAGINAL ANCILLARY ONLY
Chlamydia: NEGATIVE
Comment: NEGATIVE
Comment: NEGATIVE
Comment: NORMAL
Neisseria Gonorrhea: NEGATIVE
Trichomonas: NEGATIVE

## 2024-09-02 ENCOUNTER — Ambulatory Visit: Admitting: Obstetrics

## 2024-09-09 ENCOUNTER — Inpatient Hospital Stay: Admission: RE | Admit: 2024-09-09 | Source: Ambulatory Visit

## 2024-09-30 ENCOUNTER — Inpatient Hospital Stay: Admission: RE | Admit: 2024-09-30 | Source: Ambulatory Visit

## 2024-10-07 ENCOUNTER — Ambulatory Visit: Payer: Self-pay

## 2024-10-15 ENCOUNTER — Ambulatory Visit

## 2024-11-06 ENCOUNTER — Ambulatory Visit
Admission: RE | Admit: 2024-11-06 | Discharge: 2024-11-06 | Disposition: A | Source: Ambulatory Visit | Attending: Advanced Practice Midwife | Admitting: Advanced Practice Midwife

## 2024-11-06 DIAGNOSIS — N6310 Unspecified lump in the right breast, unspecified quadrant: Secondary | ICD-10-CM

## 2024-11-07 ENCOUNTER — Other Ambulatory Visit: Payer: Self-pay | Admitting: Advanced Practice Midwife

## 2024-11-07 DIAGNOSIS — R928 Other abnormal and inconclusive findings on diagnostic imaging of breast: Secondary | ICD-10-CM
